# Patient Record
Sex: Male | Born: 1935 | Race: White | Hispanic: No | Marital: Married | Smoking: Former smoker
Health system: Southern US, Community
[De-identification: ages and names within clinical notes are randomized; demographics above are authoritative.]

## PROBLEM LIST (undated history)

## (undated) DIAGNOSIS — E785 Hyperlipidemia, unspecified: Secondary | ICD-10-CM

## (undated) DIAGNOSIS — K219 Gastro-esophageal reflux disease without esophagitis: Secondary | ICD-10-CM

## (undated) DIAGNOSIS — R609 Edema, unspecified: Secondary | ICD-10-CM

## (undated) DIAGNOSIS — N289 Disorder of kidney and ureter, unspecified: Secondary | ICD-10-CM

## (undated) DIAGNOSIS — I2510 Atherosclerotic heart disease of native coronary artery without angina pectoris: Secondary | ICD-10-CM

## (undated) DIAGNOSIS — G629 Polyneuropathy, unspecified: Secondary | ICD-10-CM

## (undated) DIAGNOSIS — E119 Type 2 diabetes mellitus without complications: Secondary | ICD-10-CM

## (undated) DIAGNOSIS — F419 Anxiety disorder, unspecified: Secondary | ICD-10-CM

## (undated) DIAGNOSIS — R740 Nonspecific elevation of levels of transaminase and lactic acid dehydrogenase [LDH]: Secondary | ICD-10-CM

## (undated) DIAGNOSIS — G2581 Restless legs syndrome: Secondary | ICD-10-CM

## (undated) DIAGNOSIS — F329 Major depressive disorder, single episode, unspecified: Secondary | ICD-10-CM

## (undated) DIAGNOSIS — K5790 Diverticulosis of intestine, part unspecified, without perforation or abscess without bleeding: Secondary | ICD-10-CM

## (undated) DIAGNOSIS — D696 Thrombocytopenia, unspecified: Secondary | ICD-10-CM

## (undated) DIAGNOSIS — N400 Enlarged prostate without lower urinary tract symptoms: Secondary | ICD-10-CM

## (undated) DIAGNOSIS — R51 Headache: Secondary | ICD-10-CM

## (undated) DIAGNOSIS — K7460 Unspecified cirrhosis of liver: Secondary | ICD-10-CM

## (undated) DIAGNOSIS — F32A Depression, unspecified: Secondary | ICD-10-CM

## (undated) DIAGNOSIS — R42 Dizziness and giddiness: Secondary | ICD-10-CM

## (undated) DIAGNOSIS — R079 Chest pain, unspecified: Secondary | ICD-10-CM

## (undated) DIAGNOSIS — K625 Hemorrhage of anus and rectum: Secondary | ICD-10-CM

## (undated) DIAGNOSIS — D126 Benign neoplasm of colon, unspecified: Secondary | ICD-10-CM

## (undated) DIAGNOSIS — I10 Essential (primary) hypertension: Secondary | ICD-10-CM

## (undated) DIAGNOSIS — R55 Syncope and collapse: Secondary | ICD-10-CM

## (undated) DIAGNOSIS — N189 Chronic kidney disease, unspecified: Secondary | ICD-10-CM

## (undated) DIAGNOSIS — R600 Localized edema: Secondary | ICD-10-CM

## (undated) DIAGNOSIS — D509 Iron deficiency anemia, unspecified: Secondary | ICD-10-CM

## (undated) DIAGNOSIS — K120 Recurrent oral aphthae: Secondary | ICD-10-CM

## (undated) DIAGNOSIS — K432 Incisional hernia without obstruction or gangrene: Secondary | ICD-10-CM

## (undated) DIAGNOSIS — R7401 Elevation of levels of liver transaminase levels: Secondary | ICD-10-CM

## (undated) DIAGNOSIS — K766 Portal hypertension: Secondary | ICD-10-CM

## (undated) DIAGNOSIS — K589 Irritable bowel syndrome without diarrhea: Secondary | ICD-10-CM

## (undated) DIAGNOSIS — C189 Malignant neoplasm of colon, unspecified: Secondary | ICD-10-CM

## (undated) HISTORY — DX: Depression, unspecified: F32.A

## (undated) HISTORY — PX: APPENDECTOMY: SHX54

## (undated) HISTORY — DX: Dizziness and giddiness: R42

## (undated) HISTORY — DX: Nonspecific elevation of levels of transaminase and lactic acid dehydrogenase (ldh): R74.0

## (undated) HISTORY — DX: Chronic kidney disease, unspecified: N18.9

## (undated) HISTORY — DX: Polyneuropathy, unspecified: G62.9

## (undated) HISTORY — DX: Malignant neoplasm of colon, unspecified: C18.9

## (undated) HISTORY — DX: Chest pain, unspecified: R07.9

## (undated) HISTORY — DX: Anxiety disorder, unspecified: F41.9

## (undated) HISTORY — PX: HERNIA REPAIR: SHX51

## (undated) HISTORY — DX: Major depressive disorder, single episode, unspecified: F32.9

## (undated) HISTORY — DX: Benign prostatic hyperplasia without lower urinary tract symptoms: N40.0

## (undated) HISTORY — DX: Elevation of levels of liver transaminase levels: R74.01

## (undated) HISTORY — PX: PTCA: SHX146

## (undated) HISTORY — DX: Localized edema: R60.0

## (undated) HISTORY — DX: Syncope and collapse: R55

## (undated) HISTORY — DX: Edema, unspecified: R60.9

## (undated) HISTORY — DX: Type 2 diabetes mellitus without complications: E11.9

## (undated) HISTORY — DX: Portal hypertension: K76.6

## (undated) HISTORY — DX: Unspecified cirrhosis of liver: K74.60

## (undated) HISTORY — DX: Recurrent oral aphthae: K12.0

## (undated) HISTORY — DX: Restless legs syndrome: G25.81

## (undated) HISTORY — PX: HAND SURGERY: SHX662

## (undated) HISTORY — DX: Hyperlipidemia, unspecified: E78.5

## (undated) HISTORY — DX: Incisional hernia without obstruction or gangrene: K43.2

## (undated) HISTORY — DX: Disorder of kidney and ureter, unspecified: N28.9

## (undated) HISTORY — DX: Gastro-esophageal reflux disease without esophagitis: K21.9

## (undated) HISTORY — PX: TONSILLECTOMY: SUR1361

## (undated) HISTORY — DX: Atherosclerotic heart disease of native coronary artery without angina pectoris: I25.10

## (undated) HISTORY — PX: INTRAOCULAR LENS INSERTION: SHX110

## (undated) HISTORY — DX: Thrombocytopenia, unspecified: D69.6

## (undated) HISTORY — DX: Headache: R51

## (undated) HISTORY — DX: Iron deficiency anemia, unspecified: D50.9

## (undated) HISTORY — DX: Essential (primary) hypertension: I10

## (undated) HISTORY — DX: Benign neoplasm of colon, unspecified: D12.6

## (undated) HISTORY — PX: NECK SURGERY: SHX720

## (undated) HISTORY — DX: Hemorrhage of anus and rectum: K62.5

## (undated) HISTORY — DX: Irritable bowel syndrome, unspecified: K58.9

## (undated) HISTORY — DX: Diverticulosis of intestine, part unspecified, without perforation or abscess without bleeding: K57.90

---

## 1983-09-06 DIAGNOSIS — C189 Malignant neoplasm of colon, unspecified: Secondary | ICD-10-CM

## 1983-09-06 HISTORY — DX: Malignant neoplasm of colon, unspecified: C18.9

## 1993-10-06 DIAGNOSIS — D126 Benign neoplasm of colon, unspecified: Secondary | ICD-10-CM

## 1993-10-06 HISTORY — DX: Benign neoplasm of colon, unspecified: D12.6

## 1998-04-01 ENCOUNTER — Ambulatory Visit (HOSPITAL_COMMUNITY): Admission: RE | Admit: 1998-04-01 | Discharge: 1998-04-01 | Payer: Self-pay | Admitting: Internal Medicine

## 1998-04-06 ENCOUNTER — Ambulatory Visit (HOSPITAL_COMMUNITY): Admission: RE | Admit: 1998-04-06 | Discharge: 1998-04-06 | Payer: Self-pay | Admitting: Gastroenterology

## 1998-04-14 ENCOUNTER — Ambulatory Visit (HOSPITAL_COMMUNITY): Admission: RE | Admit: 1998-04-14 | Discharge: 1998-04-14 | Payer: Self-pay | Admitting: Gastroenterology

## 1998-06-23 ENCOUNTER — Ambulatory Visit (HOSPITAL_COMMUNITY): Admission: RE | Admit: 1998-06-23 | Discharge: 1998-06-23 | Payer: Self-pay | Admitting: Internal Medicine

## 1998-06-23 ENCOUNTER — Encounter: Payer: Self-pay | Admitting: Internal Medicine

## 1999-03-06 ENCOUNTER — Encounter: Payer: Self-pay | Admitting: Emergency Medicine

## 1999-03-06 ENCOUNTER — Emergency Department (HOSPITAL_COMMUNITY): Admission: EM | Admit: 1999-03-06 | Discharge: 1999-03-06 | Payer: Self-pay | Admitting: Emergency Medicine

## 1999-10-13 ENCOUNTER — Encounter: Payer: Self-pay | Admitting: Emergency Medicine

## 1999-10-13 ENCOUNTER — Inpatient Hospital Stay (HOSPITAL_COMMUNITY): Admission: EM | Admit: 1999-10-13 | Discharge: 1999-10-19 | Payer: Self-pay | Admitting: Emergency Medicine

## 1999-10-15 ENCOUNTER — Encounter: Payer: Self-pay | Admitting: Emergency Medicine

## 1999-10-15 ENCOUNTER — Encounter: Payer: Self-pay | Admitting: Cardiology

## 1999-10-27 ENCOUNTER — Ambulatory Visit (HOSPITAL_COMMUNITY): Admission: RE | Admit: 1999-10-27 | Discharge: 1999-10-27 | Payer: Self-pay | Admitting: Cardiology

## 1999-10-27 ENCOUNTER — Encounter: Payer: Self-pay | Admitting: Cardiology

## 2001-08-23 ENCOUNTER — Ambulatory Visit (HOSPITAL_COMMUNITY): Admission: RE | Admit: 2001-08-23 | Discharge: 2001-08-23 | Payer: Self-pay | Admitting: Internal Medicine

## 2001-08-23 ENCOUNTER — Encounter: Payer: Self-pay | Admitting: Internal Medicine

## 2001-09-07 ENCOUNTER — Encounter: Payer: Self-pay | Admitting: Orthopedic Surgery

## 2001-09-07 ENCOUNTER — Ambulatory Visit (HOSPITAL_COMMUNITY): Admission: RE | Admit: 2001-09-07 | Discharge: 2001-09-07 | Payer: Self-pay | Admitting: Orthopedic Surgery

## 2002-10-11 ENCOUNTER — Encounter: Payer: Self-pay | Admitting: Emergency Medicine

## 2002-10-11 ENCOUNTER — Emergency Department (HOSPITAL_COMMUNITY): Admission: EM | Admit: 2002-10-11 | Discharge: 2002-10-11 | Payer: Self-pay | Admitting: Emergency Medicine

## 2003-11-06 ENCOUNTER — Ambulatory Visit (HOSPITAL_COMMUNITY): Admission: RE | Admit: 2003-11-06 | Discharge: 2003-11-06 | Payer: Self-pay | Admitting: Cardiology

## 2004-07-23 ENCOUNTER — Ambulatory Visit: Payer: Self-pay | Admitting: Cardiology

## 2004-09-13 ENCOUNTER — Ambulatory Visit: Payer: Self-pay | Admitting: Internal Medicine

## 2004-09-20 ENCOUNTER — Ambulatory Visit: Payer: Self-pay | Admitting: Cardiology

## 2004-09-29 ENCOUNTER — Emergency Department (HOSPITAL_COMMUNITY): Admission: EM | Admit: 2004-09-29 | Discharge: 2004-09-29 | Payer: Self-pay | Admitting: Emergency Medicine

## 2004-10-06 ENCOUNTER — Ambulatory Visit: Payer: Self-pay | Admitting: Internal Medicine

## 2004-10-12 ENCOUNTER — Ambulatory Visit: Payer: Self-pay | Admitting: Internal Medicine

## 2004-10-14 ENCOUNTER — Ambulatory Visit (HOSPITAL_COMMUNITY): Admission: RE | Admit: 2004-10-14 | Discharge: 2004-10-14 | Payer: Self-pay | Admitting: Internal Medicine

## 2004-10-20 ENCOUNTER — Ambulatory Visit: Payer: Self-pay

## 2004-11-11 ENCOUNTER — Ambulatory Visit: Payer: Self-pay | Admitting: Gastroenterology

## 2004-11-26 ENCOUNTER — Ambulatory Visit: Payer: Self-pay | Admitting: Gastroenterology

## 2005-01-10 ENCOUNTER — Ambulatory Visit: Payer: Self-pay | Admitting: Internal Medicine

## 2005-01-13 ENCOUNTER — Inpatient Hospital Stay (HOSPITAL_COMMUNITY): Admission: EM | Admit: 2005-01-13 | Discharge: 2005-01-15 | Payer: Self-pay | Admitting: Emergency Medicine

## 2005-01-13 ENCOUNTER — Ambulatory Visit: Payer: Self-pay | Admitting: Internal Medicine

## 2005-01-14 ENCOUNTER — Ambulatory Visit: Payer: Self-pay | Admitting: Internal Medicine

## 2005-02-03 ENCOUNTER — Ambulatory Visit: Payer: Self-pay | Admitting: Internal Medicine

## 2005-02-09 ENCOUNTER — Ambulatory Visit: Payer: Self-pay | Admitting: Internal Medicine

## 2005-02-17 ENCOUNTER — Ambulatory Visit: Payer: Self-pay | Admitting: Internal Medicine

## 2005-02-25 ENCOUNTER — Ambulatory Visit: Payer: Self-pay | Admitting: Internal Medicine

## 2005-03-14 ENCOUNTER — Ambulatory Visit: Payer: Self-pay | Admitting: Cardiology

## 2005-05-26 ENCOUNTER — Ambulatory Visit: Payer: Self-pay | Admitting: Internal Medicine

## 2005-12-12 ENCOUNTER — Ambulatory Visit: Payer: Self-pay | Admitting: Internal Medicine

## 2005-12-29 ENCOUNTER — Ambulatory Visit: Payer: Self-pay | Admitting: Cardiology

## 2006-01-03 ENCOUNTER — Ambulatory Visit: Payer: Self-pay | Admitting: Cardiology

## 2006-01-04 ENCOUNTER — Ambulatory Visit: Payer: Self-pay | Admitting: Cardiology

## 2006-01-04 ENCOUNTER — Ambulatory Visit (HOSPITAL_COMMUNITY): Admission: RE | Admit: 2006-01-04 | Discharge: 2006-01-04 | Payer: Self-pay | Admitting: Cardiology

## 2006-01-28 ENCOUNTER — Emergency Department (HOSPITAL_COMMUNITY): Admission: EM | Admit: 2006-01-28 | Discharge: 2006-01-28 | Payer: Self-pay | Admitting: Emergency Medicine

## 2006-02-01 ENCOUNTER — Ambulatory Visit: Payer: Self-pay | Admitting: Cardiology

## 2006-02-04 ENCOUNTER — Emergency Department (HOSPITAL_COMMUNITY): Admission: EM | Admit: 2006-02-04 | Discharge: 2006-02-04 | Payer: Self-pay | Admitting: Emergency Medicine

## 2006-03-13 ENCOUNTER — Ambulatory Visit: Payer: Self-pay | Admitting: Internal Medicine

## 2006-03-15 ENCOUNTER — Ambulatory Visit: Payer: Self-pay | Admitting: Cardiology

## 2006-04-27 ENCOUNTER — Emergency Department (HOSPITAL_COMMUNITY): Admission: EM | Admit: 2006-04-27 | Discharge: 2006-04-28 | Payer: Self-pay | Admitting: Emergency Medicine

## 2006-04-28 ENCOUNTER — Emergency Department (HOSPITAL_COMMUNITY): Admission: EM | Admit: 2006-04-28 | Discharge: 2006-04-29 | Payer: Self-pay | Admitting: *Deleted

## 2006-04-28 ENCOUNTER — Ambulatory Visit: Payer: Self-pay | Admitting: Endocrinology

## 2006-05-08 ENCOUNTER — Emergency Department (HOSPITAL_COMMUNITY): Admission: EM | Admit: 2006-05-08 | Discharge: 2006-05-08 | Payer: Self-pay | Admitting: *Deleted

## 2006-06-16 ENCOUNTER — Ambulatory Visit: Payer: Self-pay | Admitting: Cardiology

## 2006-07-24 ENCOUNTER — Ambulatory Visit: Payer: Self-pay | Admitting: Internal Medicine

## 2006-12-15 ENCOUNTER — Ambulatory Visit: Payer: Self-pay | Admitting: Cardiology

## 2006-12-22 ENCOUNTER — Ambulatory Visit: Payer: Self-pay | Admitting: Internal Medicine

## 2006-12-27 ENCOUNTER — Ambulatory Visit: Payer: Self-pay | Admitting: Internal Medicine

## 2006-12-30 ENCOUNTER — Encounter: Admission: RE | Admit: 2006-12-30 | Discharge: 2006-12-30 | Payer: Self-pay | Admitting: Internal Medicine

## 2007-03-22 ENCOUNTER — Encounter: Payer: Self-pay | Admitting: Internal Medicine

## 2007-03-22 DIAGNOSIS — I2510 Atherosclerotic heart disease of native coronary artery without angina pectoris: Secondary | ICD-10-CM | POA: Insufficient documentation

## 2007-03-22 DIAGNOSIS — Z8619 Personal history of other infectious and parasitic diseases: Secondary | ICD-10-CM

## 2007-03-22 DIAGNOSIS — Z860100 Personal history of colon polyps, unspecified: Secondary | ICD-10-CM | POA: Insufficient documentation

## 2007-03-22 DIAGNOSIS — K589 Irritable bowel syndrome without diarrhea: Secondary | ICD-10-CM

## 2007-03-22 DIAGNOSIS — Z8719 Personal history of other diseases of the digestive system: Secondary | ICD-10-CM

## 2007-03-22 DIAGNOSIS — K219 Gastro-esophageal reflux disease without esophagitis: Secondary | ICD-10-CM | POA: Insufficient documentation

## 2007-04-06 ENCOUNTER — Ambulatory Visit: Payer: Self-pay | Admitting: Internal Medicine

## 2007-04-08 DIAGNOSIS — N400 Enlarged prostate without lower urinary tract symptoms: Secondary | ICD-10-CM

## 2007-06-11 ENCOUNTER — Ambulatory Visit: Payer: Self-pay | Admitting: Cardiology

## 2007-06-12 ENCOUNTER — Ambulatory Visit: Payer: Self-pay | Admitting: Internal Medicine

## 2007-06-12 ENCOUNTER — Encounter: Payer: Self-pay | Admitting: Internal Medicine

## 2007-06-12 DIAGNOSIS — E785 Hyperlipidemia, unspecified: Secondary | ICD-10-CM

## 2007-06-12 DIAGNOSIS — F411 Generalized anxiety disorder: Secondary | ICD-10-CM | POA: Insufficient documentation

## 2007-06-12 DIAGNOSIS — N390 Urinary tract infection, site not specified: Secondary | ICD-10-CM

## 2007-06-12 DIAGNOSIS — I10 Essential (primary) hypertension: Secondary | ICD-10-CM | POA: Insufficient documentation

## 2007-06-12 DIAGNOSIS — F329 Major depressive disorder, single episode, unspecified: Secondary | ICD-10-CM

## 2007-06-12 DIAGNOSIS — Z8639 Personal history of other endocrine, nutritional and metabolic disease: Secondary | ICD-10-CM

## 2007-06-12 DIAGNOSIS — Z862 Personal history of diseases of the blood and blood-forming organs and certain disorders involving the immune mechanism: Secondary | ICD-10-CM

## 2007-06-12 DIAGNOSIS — F3289 Other specified depressive episodes: Secondary | ICD-10-CM | POA: Insufficient documentation

## 2007-06-13 ENCOUNTER — Ambulatory Visit: Payer: Self-pay | Admitting: Cardiology

## 2007-06-13 LAB — CONVERTED CEMR LAB
ALT: 13 units/L (ref 0–53)
AST: 19 units/L (ref 0–37)
Alkaline Phosphatase: 55 units/L (ref 39–117)
Cholesterol: 123 mg/dL (ref 0–200)
HDL: 29.3 mg/dL — ABNORMAL LOW (ref >39.0)
LDL Cholesterol: 80 mg/dL (ref 0–99)
Total Protein: 6.8 g/dL (ref 6.0–8.3)

## 2007-07-10 ENCOUNTER — Encounter: Payer: Self-pay | Admitting: Internal Medicine

## 2007-10-24 ENCOUNTER — Ambulatory Visit: Payer: Self-pay | Admitting: Gastroenterology

## 2007-11-06 ENCOUNTER — Emergency Department (HOSPITAL_COMMUNITY): Admission: EM | Admit: 2007-11-06 | Discharge: 2007-11-06 | Payer: Self-pay | Admitting: Emergency Medicine

## 2007-11-06 ENCOUNTER — Encounter: Payer: Self-pay | Admitting: Internal Medicine

## 2007-11-07 ENCOUNTER — Ambulatory Visit: Payer: Self-pay | Admitting: Internal Medicine

## 2007-11-07 DIAGNOSIS — R945 Abnormal results of liver function studies: Secondary | ICD-10-CM | POA: Insufficient documentation

## 2007-11-07 DIAGNOSIS — R319 Hematuria, unspecified: Secondary | ICD-10-CM

## 2007-11-07 LAB — CONVERTED CEMR LAB
ALT: 153 units/L — ABNORMAL HIGH (ref 0–53)
Bilirubin, Direct: 0.4 mg/dL — ABNORMAL HIGH (ref 0.0–0.3)
Total Protein: 6.4 g/dL (ref 6.0–8.3)

## 2007-11-08 LAB — CONVERTED CEMR LAB
Hep A IgM: NEGATIVE
Hepatitis B Surface Ag: NEGATIVE

## 2007-11-09 ENCOUNTER — Ambulatory Visit: Payer: Self-pay | Admitting: Cardiology

## 2007-11-09 ENCOUNTER — Telehealth: Payer: Self-pay | Admitting: Internal Medicine

## 2007-11-14 ENCOUNTER — Ambulatory Visit: Payer: Self-pay | Admitting: Internal Medicine

## 2007-11-14 DIAGNOSIS — K120 Recurrent oral aphthae: Secondary | ICD-10-CM | POA: Insufficient documentation

## 2007-11-14 LAB — CONVERTED CEMR LAB
ALT: 94 units/L — ABNORMAL HIGH (ref 0–53)
AST: 82 units/L — ABNORMAL HIGH (ref 0–37)
Bilirubin, Direct: 0.3 mg/dL (ref 0.0–0.3)
Total Protein: 6.6 g/dL (ref 6.0–8.3)

## 2007-11-21 ENCOUNTER — Ambulatory Visit: Payer: Self-pay | Admitting: Gastroenterology

## 2007-11-21 LAB — CONVERTED CEMR LAB
Albumin: 3.7 g/dL (ref 3.5–5.2)
Angiotensin 1 Converting Enzyme: 49 units/L (ref 9–67)
Anti Nuclear Antibody(ANA): NEGATIVE
INR: 1 (ref 0.8–1.0)
Iron: 132 ug/dL (ref 42–165)
Total Bilirubin: 1.4 mg/dL — ABNORMAL HIGH (ref 0.3–1.2)
Total Protein: 6.6 g/dL (ref 6.0–8.3)
Transferrin: 193.6 mg/dL — ABNORMAL LOW (ref >=212.0)

## 2007-11-23 ENCOUNTER — Encounter: Payer: Self-pay | Admitting: Internal Medicine

## 2007-11-28 ENCOUNTER — Ambulatory Visit (HOSPITAL_COMMUNITY): Admission: RE | Admit: 2007-11-28 | Discharge: 2007-11-28 | Payer: Self-pay | Admitting: Gastroenterology

## 2007-12-12 ENCOUNTER — Ambulatory Visit: Payer: Self-pay | Admitting: Internal Medicine

## 2007-12-12 DIAGNOSIS — K432 Incisional hernia without obstruction or gangrene: Secondary | ICD-10-CM | POA: Insufficient documentation

## 2007-12-24 ENCOUNTER — Ambulatory Visit: Payer: Self-pay | Admitting: Cardiology

## 2007-12-25 ENCOUNTER — Ambulatory Visit: Payer: Self-pay

## 2007-12-25 ENCOUNTER — Encounter: Payer: Self-pay | Admitting: Cardiovascular Disease

## 2007-12-25 ENCOUNTER — Encounter: Payer: Self-pay | Admitting: Internal Medicine

## 2008-01-04 HISTORY — PX: CHOLECYSTECTOMY: SHX55

## 2008-01-07 ENCOUNTER — Ambulatory Visit: Payer: Self-pay | Admitting: Internal Medicine

## 2008-01-07 DIAGNOSIS — H6690 Otitis media, unspecified, unspecified ear: Secondary | ICD-10-CM | POA: Insufficient documentation

## 2008-01-07 DIAGNOSIS — H6120 Impacted cerumen, unspecified ear: Secondary | ICD-10-CM

## 2008-01-09 ENCOUNTER — Encounter (INDEPENDENT_AMBULATORY_CARE_PROVIDER_SITE_OTHER): Payer: Self-pay | Admitting: General Surgery

## 2008-01-09 ENCOUNTER — Ambulatory Visit (HOSPITAL_COMMUNITY): Admission: RE | Admit: 2008-01-09 | Discharge: 2008-01-10 | Payer: Self-pay | Admitting: General Surgery

## 2008-01-12 ENCOUNTER — Emergency Department (HOSPITAL_COMMUNITY): Admission: EM | Admit: 2008-01-12 | Discharge: 2008-01-12 | Payer: Self-pay | Admitting: Emergency Medicine

## 2008-01-15 ENCOUNTER — Encounter: Payer: Self-pay | Admitting: Internal Medicine

## 2008-01-18 ENCOUNTER — Encounter: Payer: Self-pay | Admitting: Internal Medicine

## 2008-01-18 ENCOUNTER — Encounter: Payer: Self-pay | Admitting: Gastroenterology

## 2008-01-18 ENCOUNTER — Telehealth: Payer: Self-pay | Admitting: Gastroenterology

## 2008-01-22 ENCOUNTER — Encounter: Payer: Self-pay | Admitting: Gastroenterology

## 2008-01-22 ENCOUNTER — Ambulatory Visit (HOSPITAL_COMMUNITY): Admission: RE | Admit: 2008-01-22 | Discharge: 2008-01-22 | Payer: Self-pay | Admitting: Gastroenterology

## 2008-01-24 ENCOUNTER — Ambulatory Visit: Payer: Self-pay | Admitting: Gastroenterology

## 2008-02-18 ENCOUNTER — Encounter: Payer: Self-pay | Admitting: Gastroenterology

## 2008-02-19 ENCOUNTER — Ambulatory Visit: Payer: Self-pay | Admitting: Gastroenterology

## 2008-02-19 DIAGNOSIS — R7401 Elevation of levels of liver transaminase levels: Secondary | ICD-10-CM | POA: Insufficient documentation

## 2008-02-19 DIAGNOSIS — Z85038 Personal history of other malignant neoplasm of large intestine: Secondary | ICD-10-CM | POA: Insufficient documentation

## 2008-02-19 DIAGNOSIS — R740 Nonspecific elevation of levels of transaminase and lactic acid dehydrogenase [LDH]: Secondary | ICD-10-CM

## 2008-02-19 DIAGNOSIS — R1031 Right lower quadrant pain: Secondary | ICD-10-CM

## 2008-02-19 LAB — CONVERTED CEMR LAB
Alkaline Phosphatase: 69 units/L (ref 39–117)
Bilirubin, Direct: 0.2 mg/dL (ref 0.0–0.3)

## 2008-02-27 ENCOUNTER — Ambulatory Visit: Payer: Self-pay | Admitting: Internal Medicine

## 2008-02-27 DIAGNOSIS — R51 Headache: Secondary | ICD-10-CM

## 2008-02-27 DIAGNOSIS — R55 Syncope and collapse: Secondary | ICD-10-CM

## 2008-02-27 DIAGNOSIS — R519 Headache, unspecified: Secondary | ICD-10-CM | POA: Insufficient documentation

## 2008-03-27 ENCOUNTER — Ambulatory Visit: Payer: Self-pay | Admitting: Cardiology

## 2008-03-27 LAB — CONVERTED CEMR LAB
HDL: 32.9 mg/dL — ABNORMAL LOW (ref >39.0)
LDL Cholesterol: 81 mg/dL (ref 0–99)
Total Bilirubin: 2.1 mg/dL — ABNORMAL HIGH (ref 0.3–1.2)
Total CHOL/HDL Ratio: 4.1
VLDL: 21 mg/dL (ref 0–40)

## 2008-04-02 ENCOUNTER — Ambulatory Visit: Payer: Self-pay

## 2008-04-11 ENCOUNTER — Encounter: Payer: Self-pay | Admitting: Internal Medicine

## 2008-04-22 ENCOUNTER — Ambulatory Visit: Payer: Self-pay | Admitting: Internal Medicine

## 2008-04-22 DIAGNOSIS — IMO0002 Reserved for concepts with insufficient information to code with codable children: Secondary | ICD-10-CM | POA: Insufficient documentation

## 2008-06-03 ENCOUNTER — Encounter: Payer: Self-pay | Admitting: Internal Medicine

## 2008-06-11 ENCOUNTER — Ambulatory Visit: Payer: Self-pay | Admitting: Internal Medicine

## 2008-06-11 DIAGNOSIS — H9190 Unspecified hearing loss, unspecified ear: Secondary | ICD-10-CM | POA: Insufficient documentation

## 2008-06-11 DIAGNOSIS — H60399 Other infective otitis externa, unspecified ear: Secondary | ICD-10-CM | POA: Insufficient documentation

## 2008-06-17 ENCOUNTER — Encounter: Payer: Self-pay | Admitting: Internal Medicine

## 2008-06-18 ENCOUNTER — Encounter: Admission: RE | Admit: 2008-06-18 | Discharge: 2008-06-18 | Payer: Self-pay | Admitting: Orthopedic Surgery

## 2008-06-19 ENCOUNTER — Encounter: Payer: Self-pay | Admitting: Internal Medicine

## 2008-09-30 ENCOUNTER — Ambulatory Visit: Payer: Self-pay | Admitting: Cardiology

## 2008-10-13 ENCOUNTER — Ambulatory Visit: Payer: Self-pay

## 2008-10-13 ENCOUNTER — Encounter: Payer: Self-pay | Admitting: Cardiology

## 2008-10-17 ENCOUNTER — Encounter: Payer: Self-pay | Admitting: Internal Medicine

## 2008-11-14 ENCOUNTER — Encounter: Payer: Self-pay | Admitting: Internal Medicine

## 2008-11-24 ENCOUNTER — Ambulatory Visit: Payer: Self-pay | Admitting: Cardiology

## 2009-01-02 ENCOUNTER — Encounter: Payer: Self-pay | Admitting: Internal Medicine

## 2009-01-28 ENCOUNTER — Ambulatory Visit: Payer: Self-pay | Admitting: Internal Medicine

## 2009-01-28 DIAGNOSIS — M25519 Pain in unspecified shoulder: Secondary | ICD-10-CM | POA: Insufficient documentation

## 2009-01-28 DIAGNOSIS — J209 Acute bronchitis, unspecified: Secondary | ICD-10-CM

## 2009-02-10 ENCOUNTER — Ambulatory Visit: Payer: Self-pay | Admitting: Internal Medicine

## 2009-02-10 ENCOUNTER — Inpatient Hospital Stay (HOSPITAL_COMMUNITY): Admission: EM | Admit: 2009-02-10 | Discharge: 2009-02-15 | Payer: Self-pay | Admitting: Emergency Medicine

## 2009-02-11 ENCOUNTER — Ambulatory Visit: Payer: Self-pay | Admitting: Vascular Surgery

## 2009-02-11 ENCOUNTER — Encounter (INDEPENDENT_AMBULATORY_CARE_PROVIDER_SITE_OTHER): Payer: Self-pay | Admitting: Internal Medicine

## 2009-02-11 ENCOUNTER — Encounter (INDEPENDENT_AMBULATORY_CARE_PROVIDER_SITE_OTHER): Payer: Self-pay | Admitting: *Deleted

## 2009-02-11 ENCOUNTER — Encounter: Payer: Self-pay | Admitting: Internal Medicine

## 2009-02-12 ENCOUNTER — Encounter (INDEPENDENT_AMBULATORY_CARE_PROVIDER_SITE_OTHER): Payer: Self-pay | Admitting: *Deleted

## 2009-02-16 ENCOUNTER — Inpatient Hospital Stay (HOSPITAL_COMMUNITY): Admission: EM | Admit: 2009-02-16 | Discharge: 2009-02-20 | Payer: Self-pay | Admitting: Emergency Medicine

## 2009-02-16 ENCOUNTER — Ambulatory Visit: Payer: Self-pay | Admitting: Internal Medicine

## 2009-02-18 ENCOUNTER — Ambulatory Visit: Payer: Self-pay | Admitting: Internal Medicine

## 2009-02-27 ENCOUNTER — Ambulatory Visit: Payer: Self-pay | Admitting: Internal Medicine

## 2009-02-27 DIAGNOSIS — R609 Edema, unspecified: Secondary | ICD-10-CM

## 2009-02-27 DIAGNOSIS — R1084 Generalized abdominal pain: Secondary | ICD-10-CM | POA: Insufficient documentation

## 2009-02-27 LAB — CONVERTED CEMR LAB
Basophils Absolute: 0 10*3/uL (ref 0.0–0.1)
CO2: 33 meq/L — ABNORMAL HIGH (ref 19–32)
GFR calc non Af Amer: 77.07 mL/min (ref >60)
Glucose, Bld: 105 mg/dL — ABNORMAL HIGH (ref 70–99)
HCT: 35.1 % — ABNORMAL LOW (ref 39.0–52.0)
Hemoglobin: 12.1 g/dL — ABNORMAL LOW (ref 13.0–17.0)
Ketones, ur: NEGATIVE mg/dL
Lymphs Abs: 1.4 10*3/uL (ref 0.7–4.0)
MCHC: 34.6 g/dL (ref 30.0–36.0)
Monocytes Relative: 8.4 % (ref 3.0–12.0)
Neutro Abs: 2.3 10*3/uL (ref 1.4–7.7)
Potassium: 3.7 meq/L (ref 3.5–5.1)
RDW: 13.3 % (ref 11.5–14.6)
Sodium: 142 meq/L (ref 135–145)
Specific Gravity, Urine: 1.015 (ref 1.000–1.030)
Urine Glucose: NEGATIVE mg/dL
Urobilinogen, UA: 1 (ref 0.0–1.0)
pH: 7 (ref 5.0–8.0)

## 2009-03-02 ENCOUNTER — Encounter: Payer: Self-pay | Admitting: Internal Medicine

## 2009-03-04 ENCOUNTER — Ambulatory Visit: Payer: Self-pay | Admitting: Internal Medicine

## 2009-03-07 ENCOUNTER — Emergency Department (HOSPITAL_COMMUNITY): Admission: EM | Admit: 2009-03-07 | Discharge: 2009-03-07 | Payer: Self-pay | Admitting: Emergency Medicine

## 2009-03-07 ENCOUNTER — Encounter: Payer: Self-pay | Admitting: Endocrinology

## 2009-03-07 ENCOUNTER — Telehealth (INDEPENDENT_AMBULATORY_CARE_PROVIDER_SITE_OTHER): Payer: Self-pay | Admitting: *Deleted

## 2009-03-13 ENCOUNTER — Ambulatory Visit: Payer: Self-pay | Admitting: Internal Medicine

## 2009-03-13 DIAGNOSIS — I951 Orthostatic hypotension: Secondary | ICD-10-CM | POA: Insufficient documentation

## 2009-04-08 ENCOUNTER — Ambulatory Visit: Payer: Self-pay

## 2009-04-08 ENCOUNTER — Telehealth: Payer: Self-pay | Admitting: Cardiology

## 2009-04-08 ENCOUNTER — Encounter: Payer: Self-pay | Admitting: Cardiology

## 2009-04-09 ENCOUNTER — Telehealth: Payer: Self-pay | Admitting: Cardiology

## 2009-04-10 ENCOUNTER — Ambulatory Visit: Payer: Self-pay | Admitting: Internal Medicine

## 2009-04-10 LAB — CONVERTED CEMR LAB
Albumin: 3.7 g/dL (ref 3.5–5.2)
Basophils Relative: 0.6 % (ref 0.0–3.0)
CO2: 34 meq/L — ABNORMAL HIGH (ref 19–32)
Chloride: 106 meq/L (ref 96–112)
Eosinophils Absolute: 0.4 10*3/uL (ref 0.0–0.7)
Eosinophils Relative: 6.8 % — ABNORMAL HIGH (ref 0.0–5.0)
HDL: 33 mg/dL — ABNORMAL LOW (ref >39.00)
Hemoglobin: 14.1 g/dL (ref 13.0–17.0)
LDL Cholesterol: 76 mg/dL (ref 0–99)
MCHC: 34 g/dL (ref 30.0–36.0)
MCV: 92.8 fL (ref 78.0–100.0)
Monocytes Absolute: 0.4 10*3/uL (ref 0.1–1.0)
Neutro Abs: 2.9 10*3/uL (ref 1.4–7.7)
Potassium: 4.9 meq/L (ref 3.5–5.1)
RBC: 4.46 M/uL (ref 4.22–5.81)
Sodium: 142 meq/L (ref 135–145)
Total CHOL/HDL Ratio: 4
Triglycerides: 138 mg/dL (ref 0.0–149.0)
VLDL: 27.6 mg/dL (ref 0.0–40.0)

## 2009-05-09 DIAGNOSIS — Z87898 Personal history of other specified conditions: Secondary | ICD-10-CM

## 2009-05-09 DIAGNOSIS — D509 Iron deficiency anemia, unspecified: Secondary | ICD-10-CM

## 2009-05-09 DIAGNOSIS — N189 Chronic kidney disease, unspecified: Secondary | ICD-10-CM | POA: Insufficient documentation

## 2009-05-09 DIAGNOSIS — E119 Type 2 diabetes mellitus without complications: Secondary | ICD-10-CM

## 2009-05-09 DIAGNOSIS — D696 Thrombocytopenia, unspecified: Secondary | ICD-10-CM

## 2009-05-19 ENCOUNTER — Ambulatory Visit: Payer: Self-pay | Admitting: Cardiology

## 2009-05-21 ENCOUNTER — Encounter: Payer: Self-pay | Admitting: Cardiology

## 2009-05-21 ENCOUNTER — Ambulatory Visit (HOSPITAL_COMMUNITY): Admission: RE | Admit: 2009-05-21 | Discharge: 2009-05-21 | Payer: Self-pay | Admitting: Cardiology

## 2009-07-07 ENCOUNTER — Ambulatory Visit: Payer: Self-pay | Admitting: Internal Medicine

## 2009-07-22 ENCOUNTER — Ambulatory Visit: Payer: Self-pay | Admitting: Cardiology

## 2009-07-22 DIAGNOSIS — N62 Hypertrophy of breast: Secondary | ICD-10-CM

## 2009-07-29 ENCOUNTER — Encounter: Admission: RE | Admit: 2009-07-29 | Discharge: 2009-07-29 | Payer: Self-pay | Admitting: Cardiology

## 2009-08-04 ENCOUNTER — Ambulatory Visit: Payer: Self-pay | Admitting: Internal Medicine

## 2009-08-19 ENCOUNTER — Telehealth: Payer: Self-pay | Admitting: Cardiology

## 2009-08-24 ENCOUNTER — Telehealth: Payer: Self-pay | Admitting: Cardiology

## 2009-09-02 ENCOUNTER — Encounter: Payer: Self-pay | Admitting: Cardiology

## 2009-09-02 ENCOUNTER — Ambulatory Visit: Payer: Self-pay

## 2009-10-13 ENCOUNTER — Encounter (INDEPENDENT_AMBULATORY_CARE_PROVIDER_SITE_OTHER): Payer: Self-pay | Admitting: *Deleted

## 2009-11-12 ENCOUNTER — Encounter: Payer: Self-pay | Admitting: Internal Medicine

## 2009-11-30 ENCOUNTER — Ambulatory Visit: Payer: Self-pay | Admitting: Internal Medicine

## 2009-11-30 ENCOUNTER — Ambulatory Visit: Payer: Self-pay | Admitting: Cardiovascular Disease

## 2009-11-30 DIAGNOSIS — R109 Unspecified abdominal pain: Secondary | ICD-10-CM

## 2009-11-30 DIAGNOSIS — R310 Gross hematuria: Secondary | ICD-10-CM

## 2009-11-30 DIAGNOSIS — M549 Dorsalgia, unspecified: Secondary | ICD-10-CM | POA: Insufficient documentation

## 2009-11-30 DIAGNOSIS — R413 Other amnesia: Secondary | ICD-10-CM

## 2009-11-30 DIAGNOSIS — F29 Unspecified psychosis not due to a substance or known physiological condition: Secondary | ICD-10-CM | POA: Insufficient documentation

## 2009-12-01 LAB — CONVERTED CEMR LAB
Albumin: 3.8 g/dL (ref 3.5–5.2)
Alkaline Phosphatase: 63 units/L (ref 39–117)
Basophils Absolute: 0 10*3/uL (ref 0.0–0.1)
Basophils Relative: 0.8 % (ref 0.0–3.0)
CO2: 30 meq/L (ref 19–32)
Calcium: 9 mg/dL (ref 8.4–10.5)
Chloride: 102 meq/L (ref 96–112)
Creatinine, Ser: 1.4 mg/dL (ref 0.4–1.5)
Eosinophils Absolute: 0.2 10*3/uL (ref 0.0–0.7)
Glucose, Bld: 180 mg/dL — ABNORMAL HIGH (ref 70–99)
Hemoglobin, Urine: NEGATIVE
Hemoglobin: 14.8 g/dL (ref 13.0–17.0)
Hgb A1c MFr Bld: 7.5 % — ABNORMAL HIGH (ref 4.6–6.5)
Leukocytes, UA: NEGATIVE
Lipase: 26 units/L (ref 11.0–59.0)
Lymphocytes Relative: 31.3 % (ref 12.0–46.0)
MCHC: 33.4 g/dL (ref 30.0–36.0)
MCV: 96.6 fL (ref 78.0–100.0)
Monocytes Absolute: 0.3 10*3/uL (ref 0.1–1.0)
Neutro Abs: 3.2 10*3/uL (ref 1.4–7.7)
Nitrite: NEGATIVE
RDW: 13.1 % (ref 11.5–14.6)
Sodium: 139 meq/L (ref 135–145)
Specific Gravity, Urine: >=1.03 (ref 1.000–1.030)
Total Protein: 6.4 g/dL (ref 6.0–8.3)
Urobilinogen, UA: 0.2 (ref 0.0–1.0)

## 2010-01-14 ENCOUNTER — Ambulatory Visit: Payer: Self-pay | Admitting: Cardiology

## 2010-02-24 ENCOUNTER — Ambulatory Visit: Payer: Self-pay | Admitting: Internal Medicine

## 2010-02-24 LAB — CONVERTED CEMR LAB
FSH: 8.3 milliintl units/mL (ref 1.4–18.1)
LH: 3.19 milliintl units/mL (ref 3.10–34.60)
Prolactin: 5.8 ng/mL

## 2010-03-02 ENCOUNTER — Encounter: Admission: RE | Admit: 2010-03-02 | Discharge: 2010-03-02 | Payer: Self-pay | Admitting: Internal Medicine

## 2010-03-09 LAB — CONVERTED CEMR LAB
Sex Hormone Binding: 63 nmol/L (ref 13–71)
Testosterone: 294.53 ng/dL — ABNORMAL LOW (ref 350–890)

## 2010-06-08 ENCOUNTER — Ambulatory Visit: Payer: Self-pay | Admitting: Internal Medicine

## 2010-06-08 DIAGNOSIS — H918X9 Other specified hearing loss, unspecified ear: Secondary | ICD-10-CM | POA: Insufficient documentation

## 2010-06-08 DIAGNOSIS — L989 Disorder of the skin and subcutaneous tissue, unspecified: Secondary | ICD-10-CM | POA: Insufficient documentation

## 2010-06-08 LAB — CONVERTED CEMR LAB
Albumin: 4.4 g/dL (ref 3.5–5.2)
BUN: 20 mg/dL (ref 6–23)
Basophils Absolute: 0 10*3/uL (ref 0.0–0.1)
CO2: 28 meq/L (ref 19–32)
Calcium: 9.7 mg/dL (ref 8.4–10.5)
Cholesterol: 131 mg/dL (ref 0–200)
Creatinine,U: 362.5 mg/dL
Direct LDL: 74.8 mg/dL
Eosinophils Absolute: 0.3 10*3/uL (ref 0.0–0.7)
GFR calc non Af Amer: 38.73 mL/min (ref >60)
Glucose, Bld: 104 mg/dL — ABNORMAL HIGH (ref 70–99)
HCT: 44 % (ref 39.0–52.0)
HDL: 29.3 mg/dL — ABNORMAL LOW (ref >39.00)
Hemoglobin: 15.5 g/dL (ref 13.0–17.0)
Lymphocytes Relative: 26.5 % (ref 12.0–46.0)
Lymphs Abs: 2.3 10*3/uL (ref 0.7–4.0)
MCHC: 35.2 g/dL (ref 30.0–36.0)
Microalb Creat Ratio: 1.6 mg/g (ref 0.0–30.0)
Neutro Abs: 5.4 10*3/uL (ref 1.4–7.7)
Nitrite: NEGATIVE
Platelets: 165 10*3/uL (ref 150.0–400.0)
RDW: 13.9 % (ref 11.5–14.6)
Sodium: 140 meq/L (ref 135–145)
Total Bilirubin: 2.3 mg/dL — ABNORMAL HIGH (ref 0.3–1.2)
Total CHOL/HDL Ratio: 4
Urine Glucose: NEGATIVE mg/dL
VLDL: 67.6 mg/dL — ABNORMAL HIGH (ref 0.0–40.0)
pH: 6 (ref 5.0–8.0)

## 2010-06-10 ENCOUNTER — Encounter: Payer: Self-pay | Admitting: Internal Medicine

## 2010-06-11 ENCOUNTER — Encounter: Payer: Self-pay | Admitting: Internal Medicine

## 2010-06-21 ENCOUNTER — Telehealth: Payer: Self-pay | Admitting: Gastroenterology

## 2010-07-20 ENCOUNTER — Encounter: Payer: Self-pay | Admitting: Internal Medicine

## 2010-07-20 ENCOUNTER — Telehealth: Payer: Self-pay | Admitting: Internal Medicine

## 2010-07-23 ENCOUNTER — Encounter: Admission: RE | Admit: 2010-07-23 | Discharge: 2010-07-23 | Payer: Self-pay | Admitting: Surgery

## 2010-07-28 ENCOUNTER — Ambulatory Visit: Payer: Self-pay | Admitting: Cardiology

## 2010-08-06 ENCOUNTER — Encounter: Payer: Self-pay | Admitting: Internal Medicine

## 2010-09-02 ENCOUNTER — Ambulatory Visit: Payer: Self-pay | Admitting: Internal Medicine

## 2010-09-07 LAB — CONVERTED CEMR LAB
ALT: 26 units/L (ref 0–53)
AST: 28 units/L (ref 0–37)
BUN: 16 mg/dL (ref 6–23)
Basophils Relative: 0.5 % (ref 0.0–3.0)
Bilirubin, Direct: 0.4 mg/dL — ABNORMAL HIGH (ref 0.0–0.3)
Blood, UA: NEGATIVE
Eosinophils Relative: 4.5 % (ref 0.0–5.0)
GFR calc non Af Amer: 50.4 mL/min — ABNORMAL LOW (ref >60.00)
HCT: 45 % (ref 39.0–52.0)
LDL Cholesterol: 84 mg/dL (ref 0–99)
Lymphs Abs: 1.8 10*3/uL (ref 0.7–4.0)
Monocytes Relative: 7.6 % (ref 3.0–12.0)
Nitrite: NEGATIVE
PSA: 1.12 ng/mL (ref 0.10–4.00)
Platelets: 140 10*3/uL — ABNORMAL LOW (ref 150.0–400.0)
Potassium: 4.8 meq/L (ref 3.5–5.1)
RBC: 4.74 M/uL (ref 4.22–5.81)
Sodium: 139 meq/L (ref 135–145)
TSH: 0.54 microintl units/mL (ref 0.35–5.50)
Total Bilirubin: 2.5 mg/dL — ABNORMAL HIGH (ref 0.3–1.2)
Urobilinogen, UA: 0.2 (ref 0.0–1.0)
VLDL: 26 mg/dL (ref 0.0–40.0)
WBC: 6.1 10*3/uL (ref 4.5–10.5)

## 2010-09-09 ENCOUNTER — Ambulatory Visit
Admission: RE | Admit: 2010-09-09 | Discharge: 2010-09-09 | Payer: Self-pay | Source: Home / Self Care | Attending: Internal Medicine | Admitting: Internal Medicine

## 2010-09-09 DIAGNOSIS — R5381 Other malaise: Secondary | ICD-10-CM | POA: Insufficient documentation

## 2010-09-09 DIAGNOSIS — R5383 Other fatigue: Secondary | ICD-10-CM
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
## 2010-10-05 NOTE — Assessment & Plan Note (Signed)
Summary: f35m   Visit Type:  Follow-up Referring Provider:  i knapp Primary Provider:  Oliver Barre, M.D.   History of Present Illness: no cardiology complaints today.  Overall doing well.  A couple of months ago was digging.  He heard his heart beat loud, and then it went away.  His wife says he is more confused and has an appointment with Dr. Jonny Ruiz.  Denies chest pain, except once in awhile, he takes deep breaths and they stop.  BP is variable at this point in time..   Current Medications (verified): 1)  Celexa 20 Mg  Tabs (Citalopram Hydrobromide) .Marland Kitchen.. 1 By Mouth Once Daily 2)  Zetia 10 Mg  Tabs (Ezetimibe) .... Take 1 Tablet By Mouth Once A Day 3)  Pravachol 80 Mg  Tabs (Pravastatin Sodium) .... Take 1 Tablet By Mouth Once A Day 4)  Finasteride 5 Mg  Tabs (Finasteride) .... Take 1 Tablet By Mouth Once A Day 5)  Omeprazole 20 Mg Cpdr (Omeprazole) .Marland Kitchen.. 1po Once Daily 6)  Aspirin 81 Mg Tbec (Aspirin) .... Take One Tablet By Mouth Daily 7)  Metformin Hcl 500 Mg Xr24h-Tab (Metformin Hcl) .Marland Kitchen.. 1po  Once Daily  Allergies: 1)  ! Novocain 2)  ! * Uroxatrol 3)  ! * Rapaflo 4)  ! * Avodart  Comments:  Nurse/Medical Assistant: patient reviewed med list from previous ov and stated that all meds are the same [facility] [employer] [facility]  Past History:  Past Medical History: Last updated: 11/30/2009 Current Problems:  CORONARY ARTERY DISEASE (ICD-414.00) POSTURAL HYPOTENSION (ICD-458.0) HYPERTENSION (ICD-401.9) HYPERLIPIDEMIA (ICD-272.4) RENAL INSUFFICIENCY, ACUTE (ICD-585.9) SYNCOPE (ICD-780.2) PERIPHERAL EDEMA (ICD-782.3) GERD (ICD-530.81) DIABETES MELLITUS, TYPE II (ICD-250.00)- ABDOMINAL PAIN, GENERALIZED (ICD-789.07) SHOULDER PAIN, LEFT (ICD-719.41) BRONCHITIS, ACUTE WITH MILD BRONCHOSPASM (ICD-466.0) UNSPECIFIED HEARING LOSS (ICD-389.9) OTITIS EXTERNA, RIGHT (ICD-380.10) LUMBAR RADICULOPATHY, LEFT (ICD-724.4) HEADACHE (ICD-784.0) PERSONAL HX COLON CANCER  (ICD-V10.05) TRANSAMINASES, SERUM, ELEVATED (ICD-790.4) ABDOMINAL PAIN RIGHT LOWER QUADRANT (ICD-789.03) IMPACTED CERUMEN (ICD-380.4) OTITIS MEDIA, ACUTE, RIGHT (ICD-382.9) PREOPERATIVE EXAMINATION (ICD-V72.84) HERNIA, INCISIONAL (ICD-553.21) APHTHOUS ULCERS (ICD-528.2) HEMATURIA UNSPECIFIED (ICD-599.70) LIVER FUNCTION TESTS, ABNORMAL (ICD-794.8) UTI (ICD-599.0) GLUCOSE INTOLERANCE, MINIMAL, HX OF (ICD-V12.2) DEPRESSION (ICD-311) ANXIETY (ICD-300.00) BENIGN PROSTATIC HYPERTROPHY (ICD-600.00) IBS (ICD-564.1) HEPATITIS B, HX OF (ICD-V12.09) GASTROINTESTINAL HEMORRHAGE, HX OF (ICD-V12.79) COLONIC POLYPS, HX OF (ICD-V12.72) IRON DEFICIENCY (ICD-280.9) BENIGN PROSTATIC HYPERTROPHY, HX OF (ICD-V13.8) THROMBOCYTOPENIA (ICD-287.5) HEARING LOSS, BILATERAL (ICD-389.9) glucose intolerance  Past Surgical History: Last updated: 05/09/2009 Appendectomy Tonsillectomy Hernia Repair Cholecystectomy 01/2008 Bilateral Hand surgery Bilateral eye lens implants  Family History: Last updated: 05/09/2009 No FH of Colon Cancer Family History of Heart Disease: Mother, Brothers x 2  Social History: Last updated: 05/09/2009 Married Former Smoker Occupation: Retired Alcohol Use - yes-1 beer every 2 weeks Daily Caffeine Use-2 cups daily Illicit Drug Use - no Patient gets regular exercise.  Vital Signs:  Patient profile:   77 year old male Weight:      206 pounds BMI:     27.28 Pulse rate:   64 / minute BP sitting:   118 / 70  (right arm)  Vitals Entered By: Dreama Saa, CNA (July 28, 2010 11:21 AM)  Physical Exam  General:  Well developed, well nourished, in no acute distress. Head:  normocephalic and atraumatic Eyes:  PERRLA/EOM intact; conjunctiva and lids normal. Lungs:  Clear bilaterally to auscultation and percussion. Heart:  PMI non displaced.  Normal S1 and S2.  No definite murmur. Abdomen:  Bowel sounds positive; abdomen soft and non-tender without masses,  organomegaly,  or hernias noted. No hepatosplenomegaly. Extremities:  No clubbing or cyanosis. Neurologic:  Alert and oriented x 3.   EKG  Procedure date:  07/28/2010  Findings:      SB.    Delay in R wave progression.  Otherwise normal  Impression & Recommendations:  Problem # 1:  CORONARY ARTERY DISEASE (ICD-414.00) stable at present time.  No significant symptoms.  will contine to monitor and he will remain on ASA> His updated medication list for this problem includes:    Aspirin 81 Mg Tbec (Aspirin) .Marland Kitchen... Take one tablet by mouth daily  Problem # 2:  HYPERLIPIDEMIA (ICD-272.4) LDL at target on the care of Dr. Jonny Ruiz. His updated medication list for this problem includes:    Zetia 10 Mg Tabs (Ezetimibe) .Marland Kitchen... Take 1 tablet by mouth once a day    Pravachol 80 Mg Tabs (Pravastatin sodium) .Marland Kitchen... Take 1 tablet by mouth once a day  Patient Instructions: 1)  Your physician wants you to follow-up in:ONE YEAR   You will receive a reminder letter in the mail two months in advance. If you don't receive a letter, please call our office to schedule the follow-up appointment.

## 2010-10-05 NOTE — Progress Notes (Signed)
Summary: Schedule Colonoscopy  Phone Note Outgoing Call Call back at Home Phone [phone]   Call placed by: Harlow Mares CMA Duncan Dull),  June 21, 2010 12:49 PM Call placed to: Patient Summary of Call: patients wife aware he needs colonoscopy. she will have him call back I gave her the number and Dr. Anselm Jungling name.  Initial call taken by: Harlow Mares CMA Duncan Dull),  June 21, 2010 12:58 PM  Follow-up for Phone Call        pt called back and said thathe "doesnt need a COL right now" Follow-up by: Harlow Mares CMA Duncan Dull),  June 24, 2010 4:35 PM

## 2010-10-05 NOTE — Consult Note (Signed)
Summary: [facility]  [facility]   Imported By: Sherian Rein 06/17/2010 12:08:24  _____________________________________________________________________  External Attachment:    Type:   Image     Comment:   External Document

## 2010-10-05 NOTE — Progress Notes (Signed)
  Phone Note Refill Request Message from:  Patient on July 20, 2010 11:23 AM  Refills Requested: Medication #1:  PRAVACHOL 80 MG  TABS Take 1 tablet by mouth once a day   Dosage confirmed as above?Dosage Confirmed   Supply Requested: 1 year  Method Requested: Pick up at Office Initial call taken by: Margaret Pyle, CMA,  July 20, 2010 11:23 AM  Follow-up for Phone Call        Pt informed, Rx in cabinet for ptpick up Follow-up by: Margaret Pyle, CMA,  July 20, 2010 1:09 PM    Prescriptions: PRAVACHOL 80 MG  TABS (PRAVASTATIN SODIUM) Take 1 tablet by mouth once a day  #90 x 3   Entered by:   Margaret Pyle, CMA   Authorized by:   Corwin Levins MD   Signed by:   Margaret Pyle, CMA on 07/20/2010   Method used:   Print then Give to Patient   RxID:   [id number]
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
## 2010-10-05 NOTE — Letter (Signed)
Summary: [facility]   Imported By: Sherian Rein 08/06/2010 08:37:35  _____________________________________________________________________  External Attachment:    Type:   Image     Comment:   External Document
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
## 2010-10-05 NOTE — Consult Note (Signed)
Summary: [facility]  [facility]   Imported By: Lester Grayson 06/15/2010 07:24:02  _____________________________________________________________________  External Attachment:    Type:   Image     Comment:   External Document

## 2010-10-05 NOTE — Letter (Signed)
Summary: [facility]   Imported By: Sherian Rein 11/23/2009 14:42:06  _____________________________________________________________________  External Attachment:    Type:   Image     Comment:   External Document

## 2010-10-05 NOTE — Letter (Signed)
Summary: Colonoscopy Letter  [facility]  [address] [place], [place] [address]   Phone: [phone]  Fax: [phone]      October 13, 2009 MRN: [id number]   Alex Higgins [address] [place], [place]  [address]   Dear Mr. Daloia,   According to your medical record, it is time for you to schedule a Colonoscopy. The American Cancer Society recommends this procedure as a method to detect early colon cancer. Patients with a family history of colon cancer, or a personal history of colon polyps or inflammatory bowel disease are at increased risk.  This letter has beeen generated based on the recommendations made at the time of your procedure. If you feel that in your particular situation this may no longer apply, please contact our office.  Please call our office at [phone] to schedule this appointment or to update your records at your earliest convenience.  Thank you for cooperating with [place] to provide you with the very best care possible.   Sincerely,  Judie Petit T. Russella Dar, M.D.  [facility] Gastroenterology Division [phone]

## 2010-10-07 NOTE — Letter (Signed)
Summary: [facility]   Imported By: Sherian Rein 09/01/2010 09:35:01  _____________________________________________________________________  External Attachment:    Type:   Image     Comment:   External Document
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
## 2010-10-07 NOTE — Assessment & Plan Note (Signed)
Summary: 6 MONTH ROV   Visit Type:  6 months follow up Referring Provider:  i knapp Primary Provider:  Oliver Barre, M.D.  CC:  Chest pains relief with deep breath.  History of Present Illness: Has ocassional chest pain, takes deep breath of air with relief of symptoms.  No exerion related symptoms.    Current Medications (verified): 1)  Celexa 20 Mg  Tabs (Citalopram Hydrobromide) .Marland Kitchen.. 1 By Mouth Once Daily 2)  Zetia 10 Mg  Tabs (Ezetimibe) .... Take 1 Tablet By Mouth Once A Day 3)  Pravachol 80 Mg  Tabs (Pravastatin Sodium) .... Take 1 Tablet By Mouth Once A Day 4)  Finasteride 5 Mg  Tabs (Finasteride) .... Take 1 Tablet By Mouth Once A Day 5)  Omeprazole 20 Mg Cpdr (Omeprazole) .Marland Kitchen.. 1po Once Daily 6)  Aspirin 81 Mg Tbec (Aspirin) .... Take One Tablet By Mouth Daily 7)  Oxycodone Hcl 5 Mg Caps (Oxycodone Hcl) .Marland Kitchen.. 1 - 2 By Mouth Q 6hrs As Needed Pain 8)  Metformin Hcl 500 Mg Xr24h-Tab (Metformin Hcl) .Marland Kitchen.. 1po  Once Daily  Allergies: 1)  ! Novocain 2)  ! * Uroxatrol 3)  ! * Rapaflo 4)  ! * Avodart  Vital Signs:  Patient profile:   77 year old male Height:      73 inches Weight:      203.25 pounds BMI:     26.91 Pulse rate:   61 / minute Pulse rhythm:   irregular Resp:     18 per minute BP supine:   130 / 80 BP sitting:   105 / 59  (left arm) Cuff size:   large  Vitals Entered By: Vikki Ports (Jan 14, 2010 4:02 PM)  Physical Exam  General:  Well developed, well nourished, in no acute distress. Head:  normocephalic and atraumatic Eyes:  PERRLA/EOM intact; conjunctiva and lids normal. Neck:  No definite bruit. Breasts:  some tenderness r breast.  no def mass. Lungs:  Clear bilaterally to auscultation and percussion. Heart:  PMI.  Normal S1 and S2 Abdomen:  Bowel sounds positive; abdomen soft and non-tender without masses, organomegaly, or hernias noted. No hepatosplenomegaly. Pulses:  pulses normal in all 4 extremities Extremities:  No clubbing or  cyanosis. Neurologic:  Alert and oriented x 3.   EKG  Procedure date:  01/14/2010  Findings:      NSR. First degree av block.  Low voltage QRS.  Impression & Recommendations:  Problem # 1:  CORONARY ARTERY DISEASE (ICD-414.00) stable at present. No recurrent symptoms.  CHest discomfort is atypical, and not likely ischemic. His updated medication list for this problem includes:    Aspirin 81 Mg Tbec (Aspirin) .Marland Kitchen... Take one tablet by mouth daily  Orders: EKG w/ Interpretation (93000)  Problem # 2:  POSTURAL HYPOTENSION (ICD-458.0) Discussed at length with patient.  Need for hydration, preventiion of volume depletion discussed.   Problem # 3:  HYPERLIPIDEMIA (ICD-272.4) tolerating His updated medication list for this problem includes:    Zetia 10 Mg Tabs (Ezetimibe) .Marland Kitchen... Take 1 tablet by mouth once a day    Pravachol 80 Mg Tabs (Pravastatin sodium) .Marland Kitchen... Take 1 tablet by mouth once a day  Patient Instructions: 1)  Your physician recommends that you continue on your current medications as directed. Please refer to the Current Medication list given to you today. 2)  Your physician wants you to follow-up in:   6 MONTHS. You will receive a reminder letter in the mail two months  in advance. If you don't receive a letter, please call our office to schedule the follow-up appointment.

## 2010-11-02 ENCOUNTER — Telehealth: Payer: Self-pay | Admitting: Internal Medicine

## 2010-11-11 NOTE — Progress Notes (Signed)
Summary: Rx req  Phone Note Call from Patient Call back at Home Phone [phone]   Caller: Patient Summary of Call: Pt called requesting hard copy rxs for Pravachol, Celexa, Prilosec and Zetia Initial call taken by: Margaret Pyle, CMA,  November 02, 2010 10:35 AM  Follow-up for Phone Call        done hardcopy to LIM side B - dahlia  Follow-up by: Corwin Levins MD,  November 02, 2010 1:08 PM  Additional Follow-up for Phone Call Additional follow up Details #1::        Pt's spouse advised, Rx in cabinet for pt pick up Additional Follow-up by: Margaret Pyle, CMA,  November 02, 2010 1:48 PM    Prescriptions: OMEPRAZOLE 20 MG CPDR (OMEPRAZOLE) 1po once two times a day  #180 x 3   Entered by:   Margaret Pyle, CMA   Authorized by:   Corwin Levins MD   Signed by:   Margaret Pyle, CMA on 11/02/2010   Method used:   Print then Give to Patient   RxID:   [id number] PRAVACHOL 80 MG  TABS (PRAVASTATIN SODIUM) Take 1 tablet by mouth once a day  #90 x 3   Entered by:   Margaret Pyle, CMA   Authorized by:   Corwin Levins MD   Signed by:   Margaret Pyle, CMA on 11/02/2010   Method used:   Print then Give to Patient   RxID:   [id number] ZETIA 10 MG  TABS (EZETIMIBE) Take 1 tablet by mouth once a day  #90 x 3   Entered by:   Margaret Pyle, CMA   Authorized by:   Corwin Levins MD   Signed by:   Margaret Pyle, CMA on 11/02/2010   Method used:   Print then Give to Patient   RxID:   [id number] CELEXA 20 MG  TABS (CITALOPRAM HYDROBROMIDE) 1 by mouth once daily  #90 x 3   Entered by:   Margaret Pyle, CMA   Authorized by:   Corwin Levins MD   Signed by:   Margaret Pyle, CMA on 11/02/2010   Method used:   Print then Give to Patient   RxID:   [id number]

## 2010-12-08 IMAGING — CR DG CHEST 2V
2 series · 2 of 2 positions shown · non-contrast
Comparison: 12/03/2007

CLINICAL DATA: Acute bronchitis

CHEST - 2 VIEW

[view not recorded (1 of 2)]
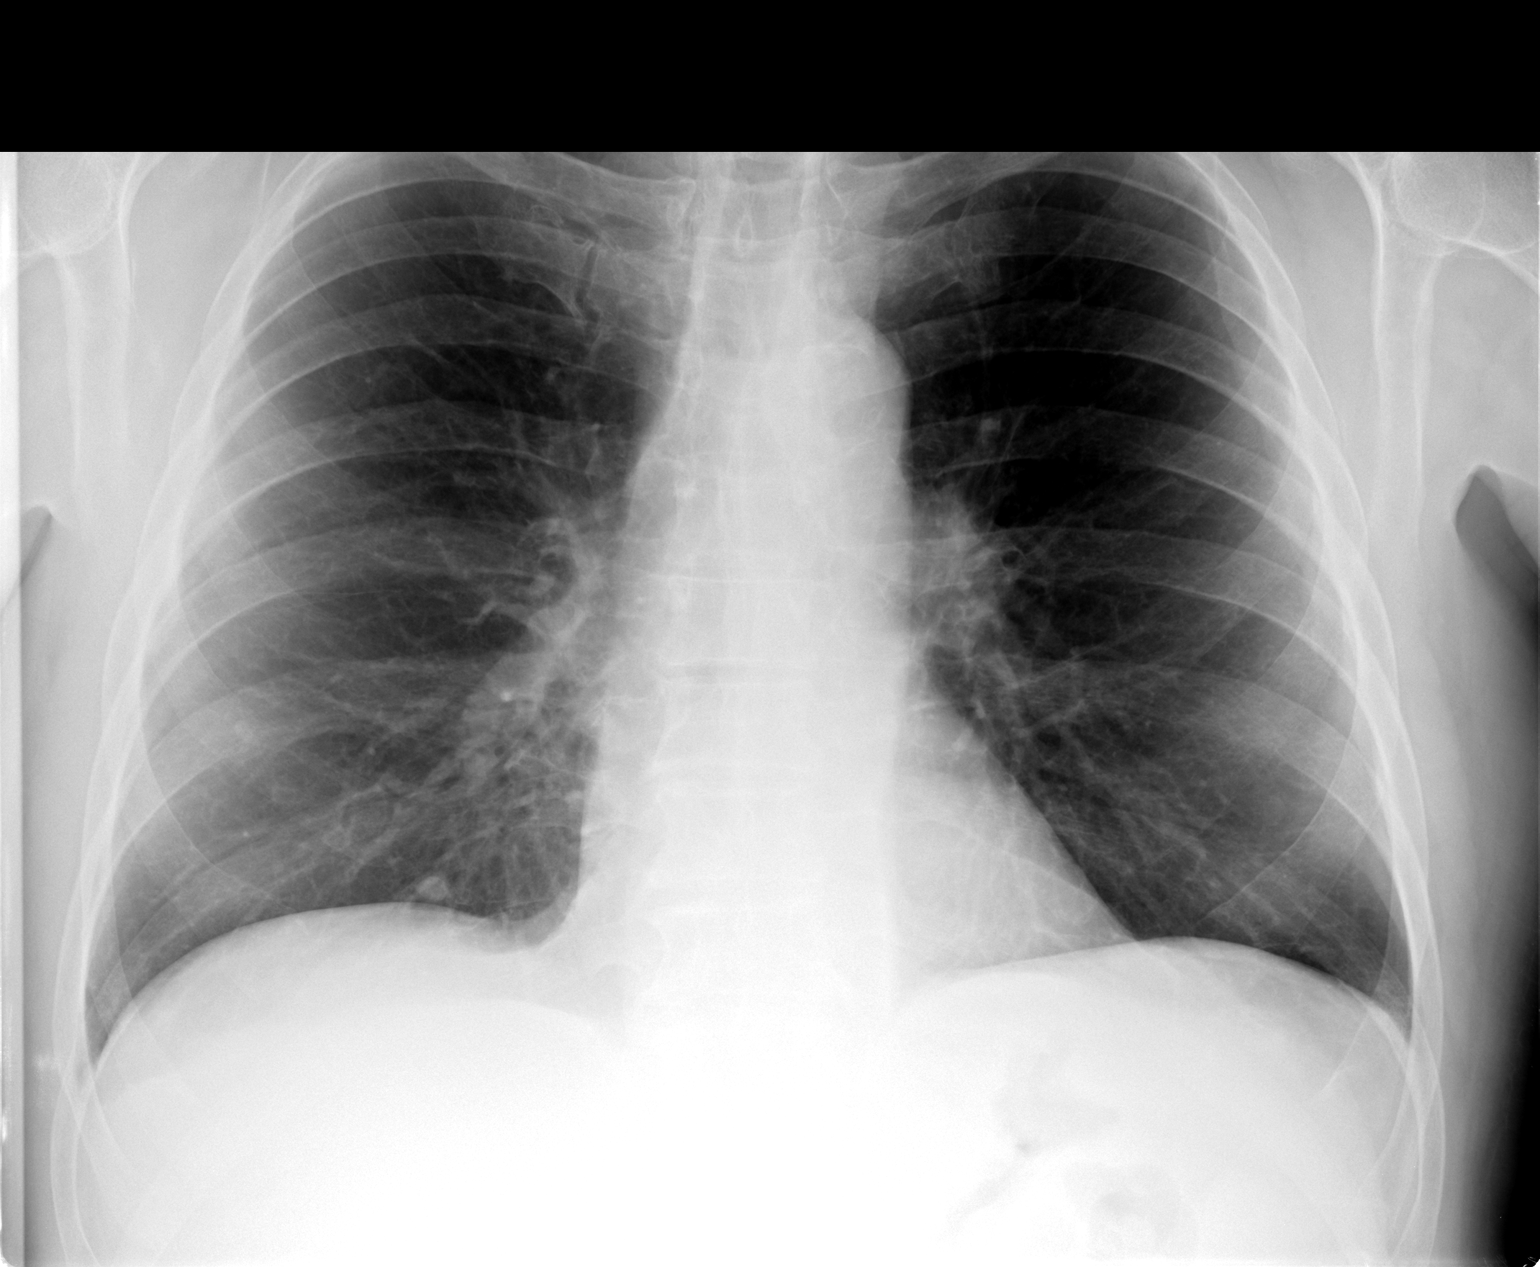

[view not recorded (2 of 2)]
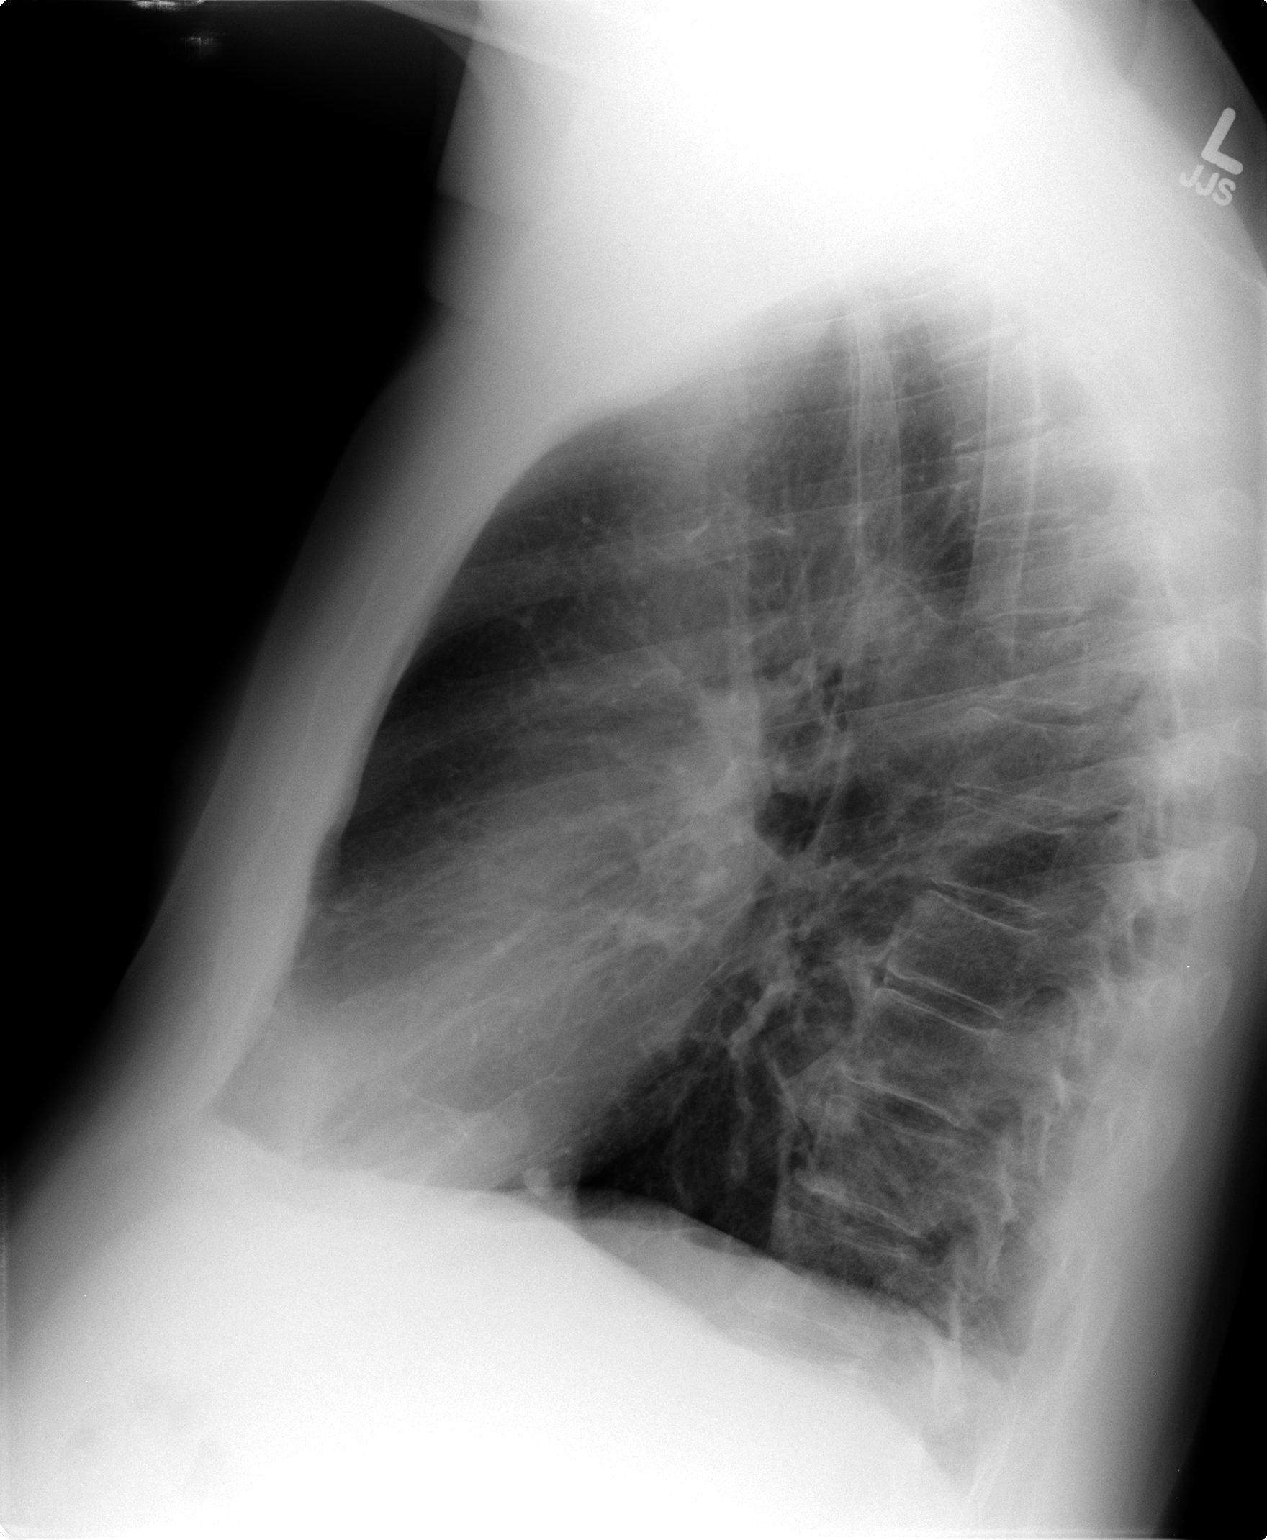

[2 of 2 positions shown; findings below may reference images not displayed]

FINDINGS: Heart size is normal.

No pleural effusions or pulmonary edema.

No airspace densities identified.

Two calcified granulomas are noted within the right base.
IMPRESSION: 1.  No acute findings.

## 2010-12-12 LAB — URINALYSIS, ROUTINE W REFLEX MICROSCOPIC
Bilirubin Urine: NEGATIVE
Ketones, ur: NEGATIVE mg/dL
Nitrite: NEGATIVE
Urobilinogen, UA: 1 mg/dL (ref 0.0–1.0)
pH: 7 (ref 5.0–8.0)

## 2010-12-12 LAB — DIFFERENTIAL
Basophils Absolute: 0 10*3/uL (ref 0.0–0.1)
Basophils Relative: 1 % (ref 0–1)
Eosinophils Absolute: 0.2 10*3/uL (ref 0.0–0.7)
Eosinophils Relative: 3 % (ref 0–5)
Lymphocytes Relative: 41 % (ref 12–46)

## 2010-12-12 LAB — CBC
HCT: 42.3 % (ref 39.0–52.0)
MCHC: 34.3 g/dL (ref 30.0–36.0)
MCV: 94.8 fL (ref 78.0–100.0)
Platelets: 169 10*3/uL (ref 150–400)
RDW: 14.6 % (ref 11.5–15.5)

## 2010-12-12 LAB — BASIC METABOLIC PANEL
BUN: 14 mg/dL (ref 6–23)
GFR calc non Af Amer: >60 mL/min (ref >60)
Glucose, Bld: 163 mg/dL — ABNORMAL HIGH (ref 70–99)
Potassium: 4.2 mEq/L (ref 3.5–5.1)

## 2010-12-13 LAB — BASIC METABOLIC PANEL
BUN: 14 mg/dL (ref 6–23)
BUN: 16 mg/dL (ref 6–23)
BUN: 20 mg/dL (ref 6–23)
CO2: 24 mEq/L (ref 19–32)
CO2: 27 mEq/L (ref 19–32)
Calcium: 7.9 mg/dL — ABNORMAL LOW (ref 8.4–10.5)
Chloride: 100 mEq/L (ref 96–112)
Chloride: 102 mEq/L (ref 96–112)
Chloride: 103 mEq/L (ref 96–112)
Chloride: 98 mEq/L (ref 96–112)
Creatinine, Ser: 1.61 mg/dL — ABNORMAL HIGH (ref 0.4–1.5)
GFR calc Af Amer: 51 mL/min — ABNORMAL LOW (ref >60)
GFR calc Af Amer: >60 mL/min (ref >60)
GFR calc non Af Amer: 49 mL/min — ABNORMAL LOW (ref >60)
GFR calc non Af Amer: 58 mL/min — ABNORMAL LOW (ref >60)
Glucose, Bld: 164 mg/dL — ABNORMAL HIGH (ref 70–99)
Glucose, Bld: 167 mg/dL — ABNORMAL HIGH (ref 70–99)
Glucose, Bld: 173 mg/dL — ABNORMAL HIGH (ref 70–99)
Glucose, Bld: 235 mg/dL — ABNORMAL HIGH (ref 70–99)
Potassium: 3.9 mEq/L (ref 3.5–5.1)
Potassium: 4.1 mEq/L (ref 3.5–5.1)
Potassium: 4.1 mEq/L (ref 3.5–5.1)
Potassium: 4.5 mEq/L (ref 3.5–5.1)
Potassium: 4.7 mEq/L (ref 3.5–5.1)
Potassium: 5.1 mEq/L (ref 3.5–5.1)
Sodium: 129 mEq/L — ABNORMAL LOW (ref 135–145)
Sodium: 132 mEq/L — ABNORMAL LOW (ref 135–145)
Sodium: 133 mEq/L — ABNORMAL LOW (ref 135–145)
Sodium: 135 mEq/L (ref 135–145)

## 2010-12-13 LAB — DIFFERENTIAL
Basophils Absolute: 0 10*3/uL (ref 0.0–0.1)
Basophils Relative: 0 % (ref 0–1)
Lymphs Abs: 0.6 10*3/uL — ABNORMAL LOW (ref 0.7–4.0)
Monocytes Relative: 9 % (ref 3–12)
Neutro Abs: 4.8 10*3/uL (ref 1.7–7.7)
Neutro Abs: 8.9 10*3/uL — ABNORMAL HIGH (ref 1.7–7.7)
Neutrophils Relative %: 81 % — ABNORMAL HIGH (ref 43–77)
Neutrophils Relative %: 81 % — ABNORMAL HIGH (ref 43–77)

## 2010-12-13 LAB — GLUCOSE, CAPILLARY
Glucose-Capillary: 146 mg/dL — ABNORMAL HIGH (ref 70–99)
Glucose-Capillary: 161 mg/dL — ABNORMAL HIGH (ref 70–99)
Glucose-Capillary: 104 mg/dL — ABNORMAL HIGH (ref 70–99)
Glucose-Capillary: 146 mg/dL — ABNORMAL HIGH (ref 70–99)
Glucose-Capillary: 149 mg/dL — ABNORMAL HIGH (ref 70–99)
Glucose-Capillary: 151 mg/dL — ABNORMAL HIGH (ref 70–99)
Glucose-Capillary: 152 mg/dL — ABNORMAL HIGH (ref 70–99)
Glucose-Capillary: 162 mg/dL — ABNORMAL HIGH (ref 70–99)
Glucose-Capillary: 162 mg/dL — ABNORMAL HIGH (ref 70–99)
Glucose-Capillary: 164 mg/dL — ABNORMAL HIGH (ref 70–99)
Glucose-Capillary: 178 mg/dL — ABNORMAL HIGH (ref 70–99)
Glucose-Capillary: 178 mg/dL — ABNORMAL HIGH (ref 70–99)
Glucose-Capillary: 193 mg/dL — ABNORMAL HIGH (ref 70–99)
Glucose-Capillary: 194 mg/dL — ABNORMAL HIGH (ref 70–99)
Glucose-Capillary: 194 mg/dL — ABNORMAL HIGH (ref 70–99)
Glucose-Capillary: 203 mg/dL — ABNORMAL HIGH (ref 70–99)

## 2010-12-13 LAB — POCT CARDIAC MARKERS: Troponin i, poc: <0.05 ng/mL (ref 0.00–0.09)

## 2010-12-13 LAB — DIRECT ANTIGLOBULIN TEST (NOT AT ARMC): DAT, IgG: NEGATIVE

## 2010-12-13 LAB — CARDIAC PANEL(CRET KIN+CKTOT+MB+TROPI)
CK, MB: 0.6 ng/mL (ref 0.3–4.0)
Relative Index: INVALID (ref 0.0–2.5)
Relative Index: INVALID (ref 0.0–2.5)
Relative Index: INVALID (ref 0.0–2.5)
Troponin I: 0.01 ng/mL (ref 0.00–0.06)
Troponin I: 0.02 ng/mL (ref 0.00–0.06)
Troponin I: 0.02 ng/mL (ref 0.00–0.06)

## 2010-12-13 LAB — CBC
HCT: 32.5 % — ABNORMAL LOW (ref 39.0–52.0)
HCT: 34.1 % — ABNORMAL LOW (ref 39.0–52.0)
HCT: 39.3 % (ref 39.0–52.0)
HCT: 39.6 % (ref 39.0–52.0)
Hemoglobin: 11.1 g/dL — ABNORMAL LOW (ref 13.0–17.0)
Hemoglobin: 12.2 g/dL — ABNORMAL LOW (ref 13.0–17.0)
Hemoglobin: 13.7 g/dL (ref 13.0–17.0)
MCHC: 33.6 g/dL (ref 30.0–36.0)
MCHC: 34.3 g/dL (ref 30.0–36.0)
MCHC: 34.9 g/dL (ref 30.0–36.0)
MCV: 92.3 fL (ref 78.0–100.0)
MCV: 93.8 fL (ref 78.0–100.0)
Platelets: 112 10*3/uL — ABNORMAL LOW (ref 150–400)
Platelets: 73 10*3/uL — ABNORMAL LOW (ref 150–400)
Platelets: 94 10*3/uL — ABNORMAL LOW (ref 150–400)
RBC: 3.48 MIL/uL — ABNORMAL LOW (ref 4.22–5.81)
RBC: 4.24 MIL/uL (ref 4.22–5.81)
RBC: 5.69 MIL/uL (ref 4.22–5.81)
RDW: 13 % (ref 11.5–15.5)
RDW: 13.1 % (ref 11.5–15.5)
RDW: 13.7 % (ref 11.5–15.5)
RDW: 13.8 % (ref 11.5–15.5)
WBC: 4 10*3/uL (ref 4.0–10.5)
WBC: 5.9 10*3/uL (ref 4.0–10.5)
WBC: 9.9 10*3/uL (ref 4.0–10.5)

## 2010-12-13 LAB — URINALYSIS, MICROSCOPIC ONLY
Glucose, UA: 500 mg/dL — AB
Hgb urine dipstick: NEGATIVE
Protein, ur: NEGATIVE mg/dL
Specific Gravity, Urine: 1.023 (ref 1.005–1.030)
pH: 6.5 (ref 5.0–8.0)

## 2010-12-13 LAB — COMPREHENSIVE METABOLIC PANEL
AST: 45 U/L — ABNORMAL HIGH (ref 0–37)
Albumin: 2.2 g/dL — ABNORMAL LOW (ref 3.5–5.2)
Alkaline Phosphatase: 44 U/L (ref 39–117)
Alkaline Phosphatase: 48 U/L (ref 39–117)
BUN: 14 mg/dL (ref 6–23)
BUN: 20 mg/dL (ref 6–23)
Calcium: 9.2 mg/dL (ref 8.4–10.5)
Chloride: 105 mEq/L (ref 96–112)
Chloride: 99 mEq/L (ref 96–112)
GFR calc Af Amer: >60 mL/min (ref >60)
Glucose, Bld: 183 mg/dL — ABNORMAL HIGH (ref 70–99)
Glucose, Bld: 264 mg/dL — ABNORMAL HIGH (ref 70–99)
Potassium: 3.3 mEq/L — ABNORMAL LOW (ref 3.5–5.1)
Potassium: 3.3 mEq/L — ABNORMAL LOW (ref 3.5–5.1)
Sodium: 134 mEq/L — ABNORMAL LOW (ref 135–145)
Sodium: 135 mEq/L (ref 135–145)
Total Bilirubin: 1.4 mg/dL — ABNORMAL HIGH (ref 0.3–1.2)
Total Bilirubin: 1.7 mg/dL — ABNORMAL HIGH (ref 0.3–1.2)
Total Protein: 6.8 g/dL (ref 6.0–8.3)

## 2010-12-13 LAB — CULTURE, BLOOD (ROUTINE X 2)
Culture: NO GROWTH
Culture: NO GROWTH
Culture: NO GROWTH
Culture: NO GROWTH
Culture: NO GROWTH

## 2010-12-13 LAB — CK TOTAL AND CKMB (NOT AT ARMC)
CK, MB: 0.8 ng/mL (ref 0.3–4.0)
CK, MB: 0.8 ng/mL (ref 0.3–4.0)
CK, MB: 0.9 ng/mL (ref 0.3–4.0)
Relative Index: INVALID (ref 0.0–2.5)
Relative Index: INVALID (ref 0.0–2.5)
Relative Index: INVALID (ref 0.0–2.5)
Total CK: 32 U/L (ref 7–232)
Total CK: 32 U/L (ref 7–232)
Total CK: 37 U/L (ref 7–232)

## 2010-12-13 LAB — T4, FREE: Free T4: 1.21 ng/dL (ref 0.80–1.80)

## 2010-12-13 LAB — CMV ABS, IGG+IGM (CYTOMEGALOVIRUS)
CMV IgM: <8 AU/mL (ref <30.0)
Cytomegalovirus Ab-IgG: >10 U/mL — ABNORMAL HIGH (ref <0.4)

## 2010-12-13 LAB — LIPID PANEL
Cholesterol: 127 mg/dL (ref 0–200)
HDL: 26 mg/dL — ABNORMAL LOW (ref >39)

## 2010-12-13 LAB — IRON AND TIBC
Iron: 18 ug/dL — ABNORMAL LOW (ref 42–135)
TIBC: 136 ug/dL — ABNORMAL LOW (ref 215–435)

## 2010-12-13 LAB — URINE MICROSCOPIC-ADD ON

## 2010-12-13 LAB — HIGH SENSITIVITY CRP: CRP, High Sensitivity: 33.1 mg/L — ABNORMAL HIGH

## 2010-12-13 LAB — VITAMIN B12: Vitamin B-12: 281 pg/mL (ref 211–911)

## 2010-12-13 LAB — TROPONIN I: Troponin I: 0.02 ng/mL (ref 0.00–0.06)

## 2010-12-13 LAB — URINALYSIS, ROUTINE W REFLEX MICROSCOPIC
Glucose, UA: 500 mg/dL — AB
Glucose, UA: >1000 mg/dL — AB
Hgb urine dipstick: NEGATIVE
Leukocytes, UA: NEGATIVE
Protein, ur: 30 mg/dL — AB
Protein, ur: NEGATIVE mg/dL
Specific Gravity, Urine: 1.029 (ref 1.005–1.030)
pH: 6.5 (ref 5.0–8.0)

## 2010-12-13 LAB — VITAMIN D 1,25 DIHYDROXY
Vitamin D 1, 25 (OH)2 Total: 24 pg/mL (ref 18–72)
Vitamin D2 1, 25 (OH)2: <8 pg/mL
Vitamin D3 1, 25 (OH)2: 24 pg/mL

## 2010-12-13 LAB — URINE CULTURE
Colony Count: 60000
Colony Count: NO GROWTH
Special Requests: NEGATIVE

## 2010-12-13 LAB — POTASSIUM: Potassium: 5.4 mEq/L — ABNORMAL HIGH (ref 3.5–5.1)

## 2010-12-13 LAB — RETICULOCYTES: Retic Count, Absolute: 33.5 10*3/uL (ref 19.0–186.0)

## 2010-12-13 LAB — PARVOVIRUS B19 ANTIBODY, IGG AND IGM: Parovirus B19 IgG Abs: 3.2 Index — ABNORMAL HIGH (ref <0.9)

## 2010-12-13 LAB — BRAIN NATRIURETIC PEPTIDE: Pro B Natriuretic peptide (BNP): <30 pg/mL (ref 0.0–100.0)

## 2010-12-13 LAB — ANA: Anti Nuclear Antibody(ANA): NEGATIVE

## 2010-12-13 LAB — GLUCOSE 6 PHOSPHATE DEHYDROGENASE: G-6-PD, Quant: 10 U/g{Hb} (ref 7–20)

## 2010-12-13 LAB — EBV AB TO VIRAL CAPSID AG PNL, IGG+IGM: EBV VCA IgG: 7.68 {ISR} — ABNORMAL HIGH

## 2010-12-23 IMAGING — CT CT HEAD W/O CM
1 of 2 series · 16 of 30 positions shown, 20 images · non-contrast
Comparison: 02/10/2009

CLINICAL DATA: Headache.  Possible stroke.  Syncope.

CT HEAD WITHOUT CONTRAST
TECHNIQUE: Contiguous axial images were obtained from the base of
the skull through the vertex without contrast.

[Series 2: head_seq 4.5 h37s st · axial · 0.43mm/px · z∈[-168,-24]mm · 16 of 36 slices shown, 20 images]
[im 2/36  brain]
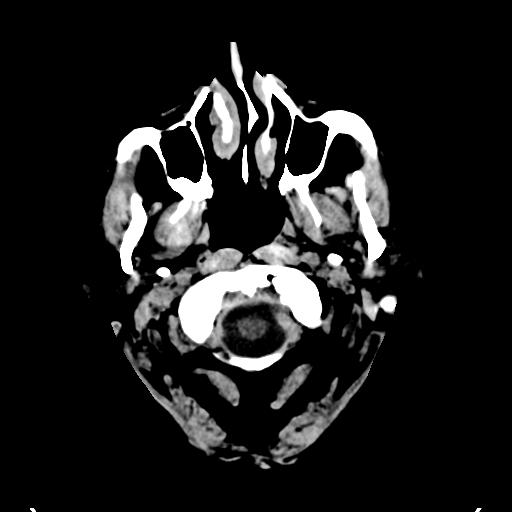
[im 2/36  bone]
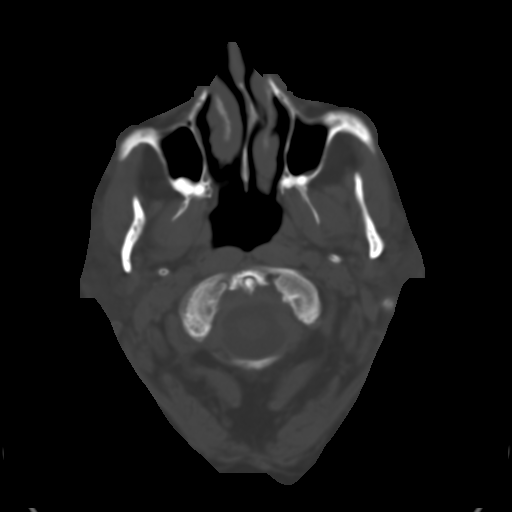
[im 4/36  brain]
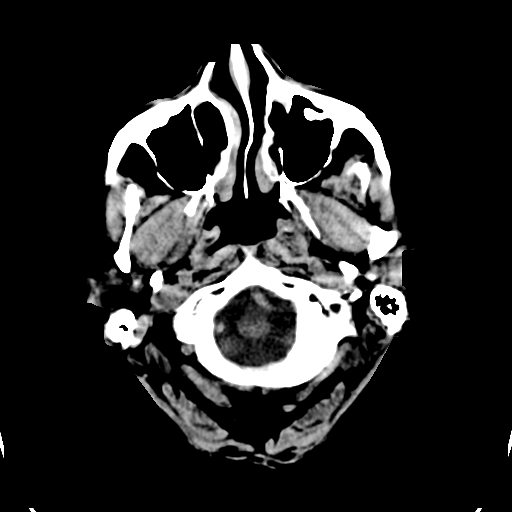
[im 6/36  brain]
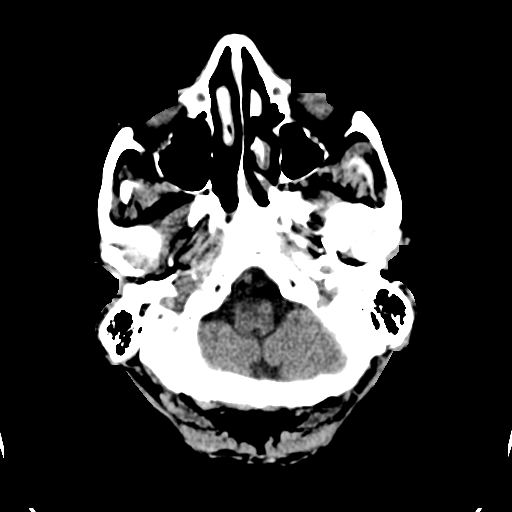
[im 9/36  brain]
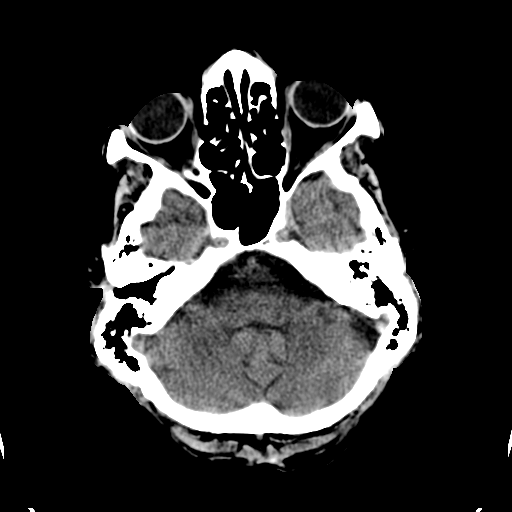
[im 11/36  brain]
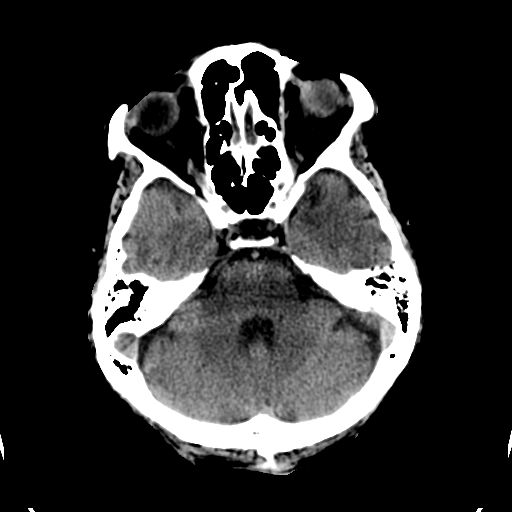
[im 11/36  bone]
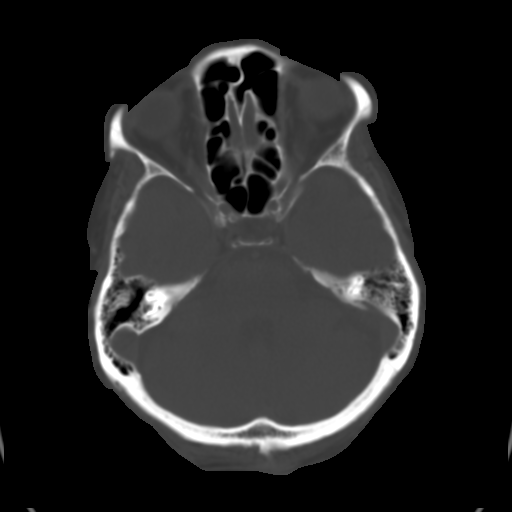
[im 13/36  brain]
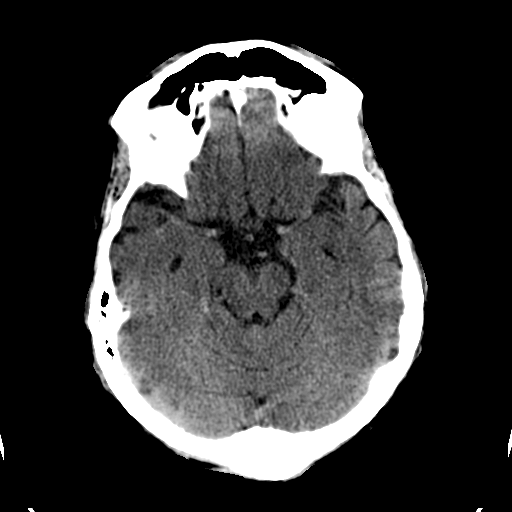
[im 15/36  brain]
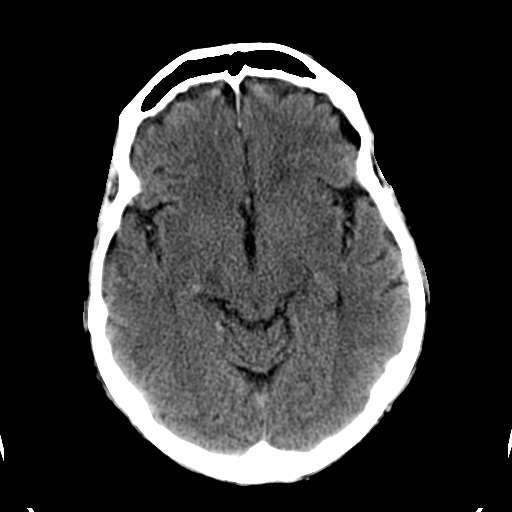
[im 16/36  brain]
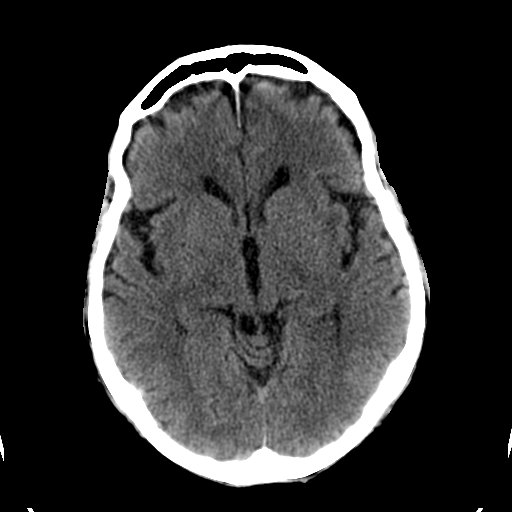
[im 20/36  brain]
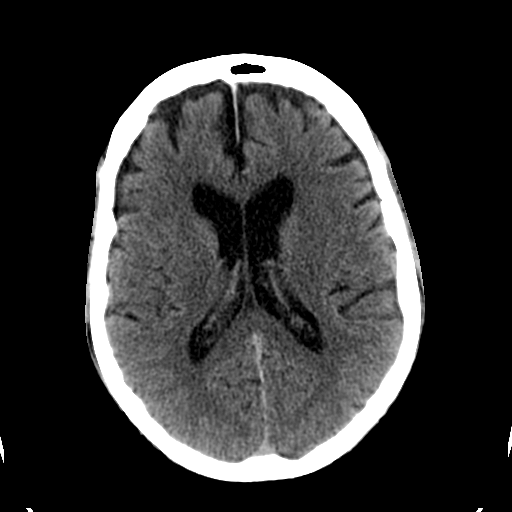
[im 20/36  bone]
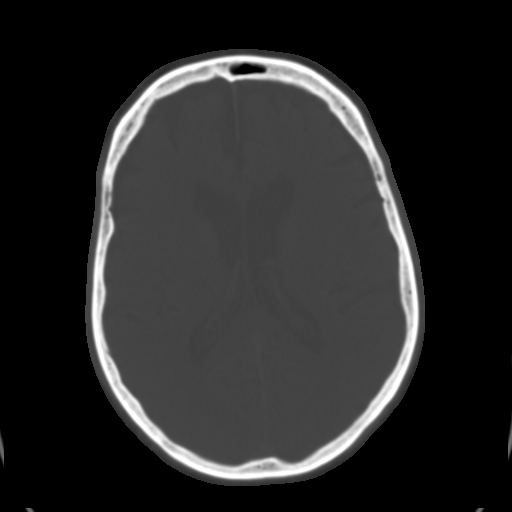
[im 22/36  brain]
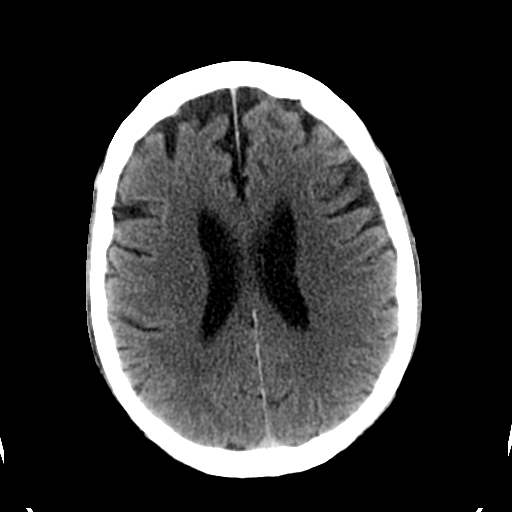
[im 23/36  brain]
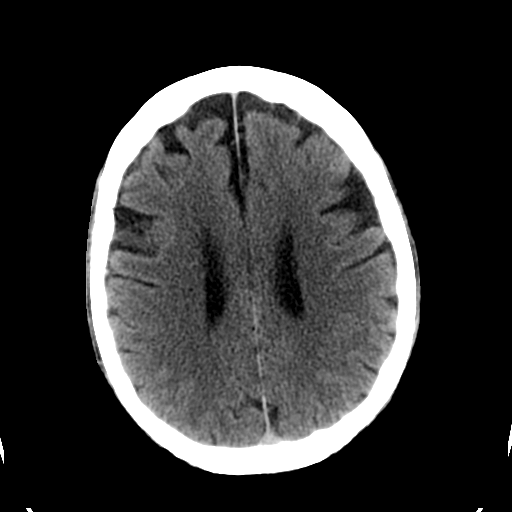
[im 25/36  brain]
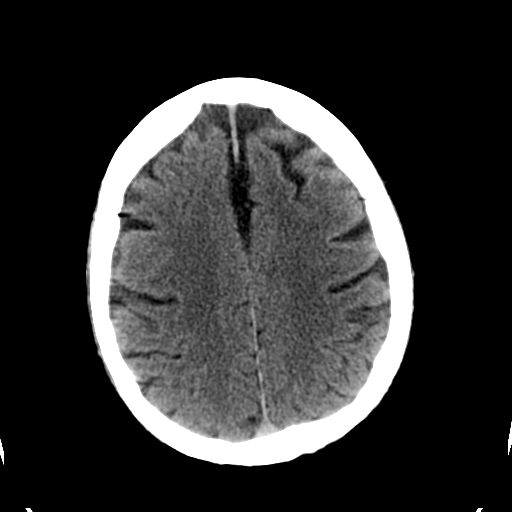
[im 27/36  brain]
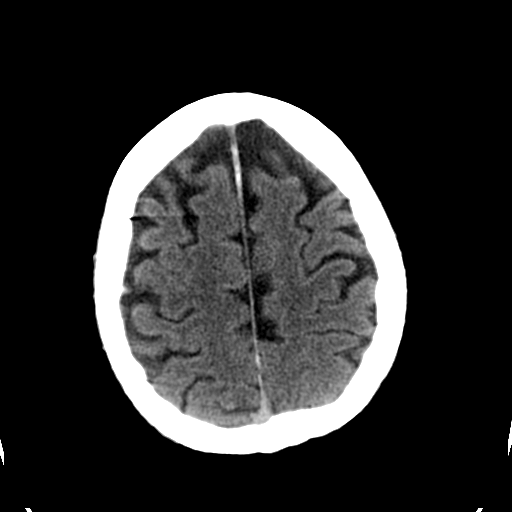
[im 27/36  bone]
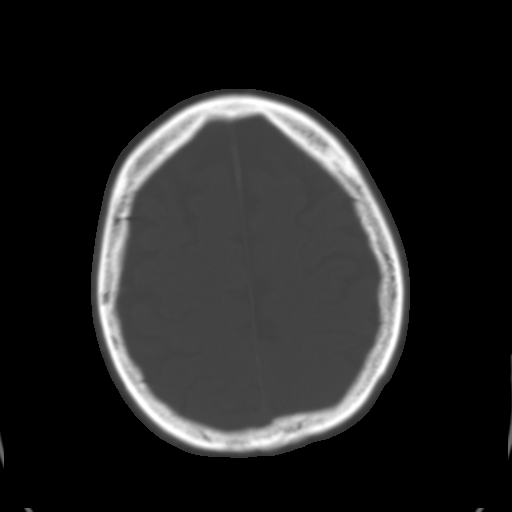
[im 30/36  brain]
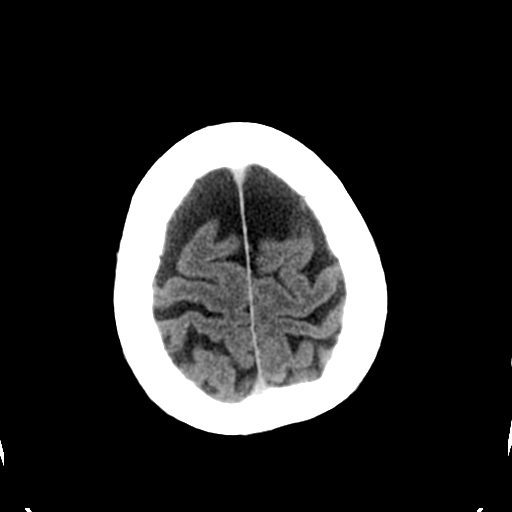
[im 32/36  brain]
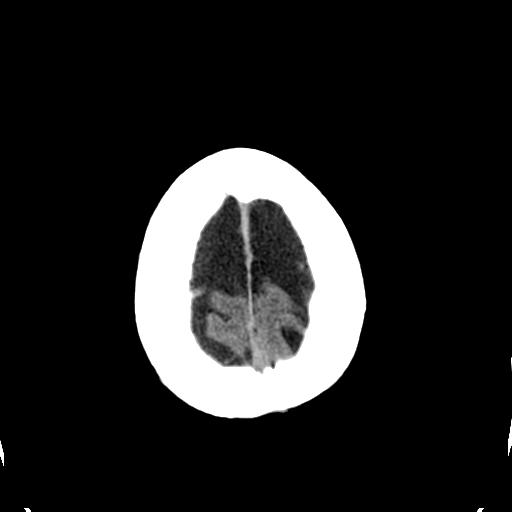
[im 34/36  brain]
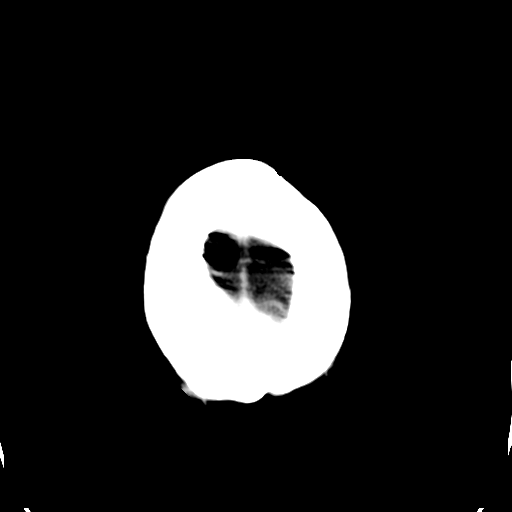

[16 of 30 positions shown; findings below may reference images not displayed]

FINDINGS: There is mild age related atrophy.  No evidence of old or
acute small or large vessel infarction.  No mass lesion,
hemorrhage, hydrocephalus or extra-axial collection.  The calvarium
is unremarkable.  The sinuses, middle ears and mastoids are clear.
IMPRESSION: Mild age related atrophy.  No focal or acute finding.

## 2011-01-04 ENCOUNTER — Inpatient Hospital Stay (INDEPENDENT_AMBULATORY_CARE_PROVIDER_SITE_OTHER)
Admission: RE | Admit: 2011-01-04 | Discharge: 2011-01-04 | Disposition: A | Discharge status: Home / Self Care | Payer: Medicare Other | Source: Ambulatory Visit | Attending: Emergency Medicine | Admitting: Emergency Medicine

## 2011-01-04 ENCOUNTER — Encounter: Payer: Self-pay | Admitting: Emergency Medicine

## 2011-01-04 DIAGNOSIS — H6120 Impacted cerumen, unspecified ear: Secondary | ICD-10-CM

## 2011-01-18 NOTE — Assessment & Plan Note (Signed)
[facility]                            CARDIOLOGY OFFICE NOTE   JEMIAH, ELLENBURG                    MRN:          [id number]  DATE:11/24/2008                            DOB:          [birth year]    Alex Higgins is in for a followup visit.  He generally is stable.  He  wants to be seen in a year but no sooner.  He has not had any more these  spells.  He generally feels quite good.   CURRENT MEDICATIONS:  1. Celexa 20 mg daily.  2. Zetia 10 mg daily.  3. Pravachol 8 mg daily.  4. Finasteride 5 mg daily.  5. Aspirin 81 mg daily.  6. Prilosec 20 mg daily.   On physical, blood pressure is 198/60, rechecked by me was 120/70.  There was no significant drop.  I do not appreciate subclavian bruits  and he has got excellent pulses bilaterally.  His two-dimensional  echocardiogram suggested an ejection fraction of 55-60%.  Overall, it  looked pretty good.  There was no diastolic murmurs heard but no major  abnormalities.   Mr. Depaz says he thinks he is doing well and does not want to be  seen for another years.  He has been eating properly.  He has been  eating three meals a day.  We will see him back in followup.  He does  not want to see a neurologist at this time.     Arturo Morton. Riley Kill, MD, [facility]  Electronically Signed    TDS/MedQ  DD: 11/24/2008  DT: 11/25/2008  Job #: [id number]

## 2011-01-18 NOTE — Assessment & Plan Note (Signed)
[facility]                         GASTROENTEROLOGY OFFICE NOTE   ZIARE, CRYDER                    MRN:          [id number]  DATE:10/24/2007                            DOB:          09-Feb-1930    REFERRING PHYSICIAN:  Corwin Levins, MD   REASON FOR CONSULTATION:  Right lower quadrant pain and diarrhea.   HISTORY OF PRESENT ILLNESS:  Alex Higgins is a 77 year old white male  that I have evaluated in the past.  He has a history of adenocarcinoma  arising in a rectal polyp in 1985.  He last underwent colonoscopy in  March of 2006, which showed diverticulosis and internal hemorrhoids.  In  addition, he has a history of GERD and irritable bowel syndrome.  His  last upper endoscopy was in January of 2005, that showed a mild,  nonerosive gastritis and RUT testing was negative.  He was previously  maintained on Aciphex 20 mg b.i.d. for control of reflux symptoms.  Recently, he has been taking omeprazole 20 mg b.i.d. on a p.r.n. basis.  He relates intermittent problems with crampy suprapubic pain, radiating  to the right lower quadrant, and occasionally to the right upper  quadrant.  This is followed by loose, water, nonbloody stools.  He  states his appetite has changed for approximately the past two to three  months.  He still eats regularly, but he does not experience hunger as  much as he did previously.  He has occasional episodes of nausea.  He  relates no dysphagia, odynophagia, weight-loss, melena, hematochezia or  change in stool caliber.   CURRENT MEDICATIONS:  Listed on the chart, updated and reviewed.   PAST MEDICAL/SURGICAL HISTORY:  Adenocarcinoma arising in a rectal polyp  in 1985  Irritable bowel syndrome  diverticulosis  hemorrhoids  GERD  anxiety  coronary artery disease  status post PTCA  status post appendectomy  status post hernia repair  status post tonsillectomy  hyperlipidemia  history of hepatitis B  benign  prostatic hypertrophy  depression  hypertension  glucose intolerance.   MEDICATION ALLERGIES:  NOVOCAIN and UROXATRAL.   CURRENT MEDICATIONS:  Listed on the chart, updated and reviewed.   SOCIAL HISTORY:  Per the handwritten form.   REVIEW OF SYSTEMS:  Per the handwritten form.   PHYSICAL EXAMINATION:  No acute distress.  Height 6 feet 1 inch, weight 205.6 pounds.  Blood pressure is 130/84,  pulse 60 and regular.  HEENT EXAM:  Anicteric sclerae, oropharynx clear.  CHEST:  Clear to auscultation bilaterally.  CARDIAC:  Regular rate and rhythm without murmurs appreciated.  ABDOMEN:  Soft, nontender, nondistended.  Normoactive bowel sounds.  No  palpable organomegaly, masses or hernias.  NEUROLOGIC:  Alert and oriented times three.  Grossly nonfocal.   ASSESSMENT AND PLAN:  1. GERD.  Change to omeprazole 40 mg p.o. q.a.m. and continue standard      antireflux measures.  2. Presumed irritable bowel syndrome.  He is to avoid any trigger      foods.  Begin Robinul Forte one b.i.d. p.r.n.  He may also use      Imodium b.i.d.  p.r.n. as needed.  If these measures do not      effectively control his symptoms, he is advised to return for      followup in six weeks.  3. Personal history of rectal cancer arising in a rectal polyp.      Recall colonoscopy recommended for March 2011.  4. Diverticulosis.  Long-term high-fiber diet and adequate fluid      intake.     Venita Lick. Russella Dar, MD, [facility]  Electronically Signed    MTS/MedQ  DD: 10/24/2007  DT: 10/24/2007  Job #: [id number]

## 2011-01-18 NOTE — Cardiovascular Report (Signed)
NAME:  Alex Higgins, Alex Higgins NO.:  [id number]   MEDICAL RECORD NO.:  [id number]          PATIENT TYPE:  AMB   LOCATION:  CATH                         FACILITY:  [facility]   PHYSICIAN:  Bevelyn Buckles. Bensimhon, MDDATE OF BIRTH:  10-Jul-1930   DATE OF PROCEDURE:  02/12/2009  DATE OF DISCHARGE:  02/12/2009                            CARDIAC CATHETERIZATION   PATIENT IDENTIFICATION:  [name] is a 77 year old male with a  history of coronary artery disease, status post stent placement to his  distal circumflex in 2001.  He also had a heart catheterization in 2005  which showed a patent circumflex stent and about a 60% lesion in his  LAD.  This was treated medically.  He was admitted with syncopal episode  the other day.  He says has been having chest pain for approximately a  year.  This is relatively constant.  He has not had any significant  change.  He was referred for cardiac catheterization to make sure that  there is not a cardiac etiology to his pain.   PROCEDURES PERFORMED:  1. Selective coronary angiography.  2. Left heart cath.  3. StarClose femoral artery closure.   Of note, a left ventriculogram was not performed due to moderate  elevation in his serum creatinine.   DESCRIPTION OF PROCEDURE:  The risks and indications were explained.  Consent was signed and placed on the chart.  A 5-French arterial sheath  was placed in the right femoral artery using a modified Seldinger  technique.  Standard catheters including a JL-4, JR-4, and angled  pigtail were used for catheterization.  All catheter exchanges were made  over wire.  No apparent complications.   Central aortic pressure was 131/67, mean 94.  LV pressure 134/3 with an  EDP of 15.  There was no aortic stenosis.   Left main was normal.   LAD was a long vessel wrapping the apex, gave off a single diagonal.  There was a long tubular 40-50% lesion in the proximal to mid section of  the LAD spanning the  diagonal.  This was non-flow limiting.   Left circumflex was a large dominant vessel, gave off small ramus  branch, a large OM-1, a large posterolateral, and a PDA.  There was a 20-  30% lesion in the mid AV groove circumflex.  The stent in the distal  left circumflex into the PD was widely patent.   Right coronary is a small nondominant vessel with no high-grade lesions.   ASSESSMENT:  Nonobstructive coronary artery disease with patent left  circumflex stent.  The left anterior descending looked good.   PLAN/DISCUSSION:  Given the results of his coronary angiography, I do  not think his chest pain is cardiac in nature.  We will continue current  regimen.      Bevelyn Buckles. Bensimhon, MD  Electronically Signed     DRB/MEDQ  D:  02/12/2009  T:  02/13/2009  Job:  [id number]

## 2011-01-18 NOTE — Discharge Summary (Signed)
NAME:  Alex Higgins, Alex Higgins NO.:  [id number]   MEDICAL RECORD NO.:  [id number]          PATIENT TYPE:  INP   LOCATION:  1438                         FACILITY:  [facility]   PHYSICIAN:  Georgina Quint. Plotnikov, MDDATE OF BIRTH:  [birth year]   DATE OF ADMISSION:  02/16/2009  DATE OF DISCHARGE:  02/20/2009                               DISCHARGE SUMMARY   DISCHARGE MEDICATIONS:  1. Finasteride 5 mg daily.  2. Aspirin 81 mg daily.  3. Citalopram 20 mg daily.  4. Prilosec 20 mg daily.  5. Fludrocortisone 0.2 mg one in the morning and one at lunch.   DISCHARGE DIET:  No added salt, low-fat, low carb.   ACTIVITY:  Increase activities slowly.   SPECIAL INSTRUCTIONS:  Check temperature twice a day, morning and  evening.  Stop Pravastatin and Zetia.   FOLLOWUP PLANS:  Dr. Jonny Ruiz next week on Wednesday or Thursday.  Skin rash  biopsy with Dr. Jonny Ruiz or myself  next week.   CONSULTATIONS:  Infectious disease, Fransisco Hertz, M.D.   DISCHARGE DIAGNOSES:  1. Recurrent syncope.  The patient has marked orthostatic hypotension,      however, episodes of dizziness were irrelevant to orthostatic      posture changes according to the patient.  Status post extensive      cardiology and neurology workup (negative results).  2. Orthostatic hypotension of unclear etiology.  3. Fever of unknown origin, etiology remains unclear.  4. Rash, fairly new.  5. Anemia with low retic count.  6. Iron deficiency.  7. Hyperglycemia with type 2 diabetes, new.  8. Thrombocytopenia.  9. Elevated D-dimer with negative CT of the chest.  10.Multiple compression fractures of thoracic spine including T4, T6      and T7.  11.Urinary tract infection with staph.  12.Dehydration, mild, corrected.   HISTORY:  1. The patient is a 77 year old male with multiple medical problems      who was readmitted on June 14 due to another syncopal episode.  For      the details please address to history and physical on February 16, 2009.  His blood pressure supine was 123/67, upright 86/38 on      admission.  During the course of hospitalization we dealt with a      number of problems.  Fever of unknown origin.  The patient was      consulted by Dr. Darlina Sicilian.  Extensive blood work was obtained.  So      far negative.  Will try to biopsy his skin lesions next week.  Dr.      Maurice March recommended to discontinue all antibiotics which we did.  He      is to check temperature twice a day at home.  2. Orthostatic hypotension.  He was treated with IV fluids.  We      increased his fludrocortisone to 0.2 mg twice a day.  He did      develop a bit of ankle swelling (trace).  3. Syncope, no recurrence during this hospitalization.  4. Anemia.  Will watch, he  may need to have a bone marrow biopsy as an      outpatient/hematology consult.  5. Type 2 diabetes, new onset.  He will try to manage it with diet,      may need to be addressed as an outpatient.  6. Thrombocytopenia, plan as above.  7. Elevated D-dimer; we did a CAT scan of his chest which came back      negative.  8. Multiple compression fractures on CAT scan of unknown age.  He is      asymptomatic, no pain.  9. Urinary tract infection with staph, antibiotics were discontinued.   On the day of discharge he is feeling fairly well.  He is demanding to  be discharged or threatens to walk out, he is going crazy here.  He is  not suicidal or homicidal.  He does not appear depressed.  Temperature  99.0, T-max 99.1.  CBG is 131-132-137, supine blood pressure 122/66,  upright 107/56, heart rate 58-69, respirations 16-18.  Hemoccult stool  negative.  He is in no acute distress.  Lower extremities have trace  edema.  HEENT moist.  Alert, oriented and cooperative.  Lungs clear with  decreased breath sounds at bases.  No wheezes.  Heart S1, S2, grade 2/6  systolic murmur.  No gallop.  Abdomen soft, nontender, no organomegaly  is felt.  Skin examination reveals 3-6 mm  erythematous papules over  extremities and trunk.  He he is alert, oriented and cooperative.   CT of the chest/angiogram showed no evidence of pulmonary emboli,  compression fractures T4, T6 and T7.  T6 might be subacute.  Hemoglobin  was 10.7 with platelet count 112, white count 4.0, sodium 134, potassium  3.3 on June 16.  TSH 0.553, LDH 191, PTH elevated at 92.0, calcium 7.3,  haptoglobin 56 normal.  DAT IgG negative, DAT complement negative.  Fecal occult blood negative.  Multiple other tests including EBV,  toxoplasma, viral  hepatitis, sed rate, CRP, ANA, ANCA, Bartonella ordered and pending.  The patient refused HIV test.  Ehrlichia, Lyme disease and [facility] Spotted Fever tests are pending.   Previously he did have elevated CRP with normal sed rate.      Georgina Quint. Plotnikov, MD  Electronically Signed     AVP/MEDQ  D:  02/20/2009  T:  02/20/2009  Job:  [id number]   cc:   Luis Abed, MD, [facility]  [address]  [address]  [place]  [place] [address]   Corwin Levins, MD  [address]  [place]  [place] [address]   Venita Lick. Russella Dar, MD, FACG  [address]  [place]  [place] [address]

## 2011-01-18 NOTE — Consult Note (Signed)
NAME:  ELIH, MOONEY NO.:  [id number]   MEDICAL RECORD NO.:  [id number]          PATIENT TYPE:  INP   LOCATION:  1414                         FACILITY:  [facility]   PHYSICIAN:  Deanna Artis. Hickling, M.D.DATE OF BIRTH:  04-23-1930   DATE OF CONSULTATION:  02/13/2009  DATE OF DISCHARGE:                                 CONSULTATION   CHIEF COMPLAINT:  Syncope.   HISTORY OF THE PRESENT CONDITION:  I was asked to see Alex Higgins, a  77 year old very pleasant gentleman, who was admitted through the  services of Dr. Christella Noa for evaluation of syncope.   The patient has been evaluated at [facility] this week for  cerebrovascular and cardiovascular etiologies.  We are asked to add our  impression to the information that has been garnered this week.   The patient has had 2 syncopal episodes on successive days that led to  his admission.  The first occurred on Sunday morning.  He had been out  working in the yard all day long on Saturday, had not had much to drink,  went to bed and slept.  When he got up and took a shower the next  morning, he collapsed in the shower stall.  He was unresponsive and came  to over about 10 minutes.  After he awakened, he appeared to be normal.  There was no significant postictal period.  There was no seizure-like  activity when he was found by his wife.   The patient had very poor color that was somewhat grayish.  This  continued throughout the day.   The next day, the patient was unsteady on his feet, needed to hold on as  he was walking.  His wife thought that his left face was drooping, and  he was drooling out of the left corner of his mouth.  He suddenly became  unresponsive to her voice while he was upright, and then collapsed.   EMS was called and was brought to the emergency room.  The patient did  not show significant hypotension or hypoglycemia as etiology.  He again  showed no signs of seizure activity.   He tells me that he had many complaints over the past few weeks.  He has  been worried about his memory and forgetting things.  He felt weak.  There were times that he has had brief episodes of staring, usually when  he is upright and walking around.  The patient has dyslipidemia.  He has  undiagnosed diabetes (hemoglobin A1c 7.2).  He has had adenocarcinoma,  benign prostatic hypertrophy, prior GI bleed, sinus bradycardia.  In the  hospital, he has been noted to have very significant and symptomatic  orthostatic hypotension.  He has a history of sleep apnea but refuses to  wear his CPAP machine.  He also has depression and gastroesophageal  reflux disease.  In the hospital, he has also been noted to have  thrombocytopenia with some bruising which is of unknown etiology.  He  also tells me that he has daily dull headaches that come and go.  He has  had  a longstanding history of headaches, and I think this represents  tension-type headaches.  He does not take medication for it.  It goes  away on its own.  He does not have any other associated symptoms.   His review of systems is negative except as noted above in his rather  complicated history of present illness and past medical history.   His current medications include:  1. Aspirin.  2. Proscar.  3. Celexa.  4. Prevacid.  5. Zetia.  6. Prilosec.  7. Darvocet.   In the hospital, he is on:  1. Aspirin 81 mg.  2. Rocephin 1 g every 24 hours (for UTI).  3. Celexa 20 mg daily.  4. Lovenox 20 mg subcutaneously.  5. Zetia 10 mg daily.  6. Flagyl 250 mg every 6 hours.  7. Protonix 40 mg every 12 hours.  8. Zocor 40 mg every 18 hours.  9. Sliding scale insulin.   ALLERGIES:  OXYTROL and NOVOCAIN.   FAMILY HISTORY:  Noncontributory.   SOCIAL HISTORY:  The patient smoked and quit in the 1960s.  He does not  use alcohol.  He lives in [place] with his spouse.  He is a retired  Art gallery manager.   PHYSICAL EXAMINATION:  This is a  well-developed gentleman, very  pleasant, engaging in no acute distress.  Blood pressure 125/81, resting  pulse 78, respirations 22, temperature 98.9.  He is right-handed.  GENERAL PHYSICAL EXAMINATION:  No signs of infection.  He had definite bruits in his carotids that were rather soft.  He also  had bruits in both vertebral arteries, too.  He had supple neck, slight  decreased range of motion related to arthritis.  LUNGS:  Clear.  HEART:  No murmurs.  Pulses normal.  ABDOMEN:  Soft, nontender.  Bowel sounds normal, slightly protuberant.  EXTREMITIES:  No edema or cyanosis.  NEUROLOGIC EXAMINATION:  Mental status:  The patient was awake, alert,  attentive, and appropriate.  His mini-mental status examination was 26.  His clock drawing was 3.  He put all the numbers outside the clock.  Cranial nerves:  Round reactive pupils.  Visual fields full to double  simultaneous stimuli.  Extraocular movements full and conjugate.  Symmetric facial strength and sensation.  Air conduction greater than  bone conduction bilaterally.  Motor examination:  The patient had  excellent strength in his arms and his legs.  Good fine motor movements.  No pronator drift.  Sensation showed a stocking neuropathy to cold  vibration and to a lesser extent proprioception to the shin.  He had  fairly normal sensation in his upper extremities.  He had good  stereoagnosis in his upper extremities.  His gait was not tested, but he  shows lower extremity weakness, but he does not show significant  dystaxia or shuffling behavior.  Coordination was adequate for finger-to-  nose and heel-knee-shin.  Rapid movements were acceptable.  He has a  very slight postural tremor.   Reflexes were symmetric and normal in the upper extremities, diminished  to absent at the knees and ankles.  He had bilateral flexor plantar  responses.   IMPRESSION:  1. I believe this gentleman has significant orthostatic hypotension,      458.0,  that is symptomatic and caused a syncopal episode.  I do not      think that he had a seizure.  I think that he has had very adequate      workup looking for strokes and looking for symptomatic  vascular      disease.  The area I am not certain about is vertebrals where I can      hear bruits, but he seems to have antegrade flow.  I have reviewed      his MRI scan, and it shows significant brain atrophy.  The patient      also has some abnormalities in his mini-mental status examination      and his clock drawing.  2. This patient has mild cognitive dysfunction versus an early      dementia.  We need to think about placing him on Aricept, and if      not this time, he needs to be watched very closely, because if he      changes anymore, he needs to be placed on medication.  I do not      think this is the reason for his syncope, however.   The patient has had a cardiac catheterization that basically rules out  cardiac ischemia.  He has no strokes and no signs of prior stroke to his  brain stem.  He says he feels much better since he has been in the  hospital and is not having cramps in his legs which he had almost every  night prior to admission, and he also is not having significant  lightheadedness at this time.   Orthostatic blood pressures in the hospital have shown recumbent blood  pressure 125/81, sitting 112/62, standing 72/39 with pulses rising from  78 to 83-94.   RECOMMENDATIONS:  1. I agree with Dr. Debby Bud' plan to place him on Florinef and hydrate      him.  Laboratory studies are at this point unrevealing of etiology      for some form of systemic collagen vascular disease.  I strongly      suspect CNS hypoperfusion is the reason for his syncopal episodes.  2. Consider the use of Aricept.  3. He does not have significant hypokalemia.  I am not certain why he      has cramps in his legs.  That may bear further watching.  He has      tension-type headaches.  He also has  significant peripheral      polyneuropathy and does not feel his feet which can be another      reason why he might fall.   I appreciate the opportunity to see this very nice gentleman.  You have  questions about this or if I can be of assistance, do not hesitate to  contact me.      Deanna Artis. Sharene Skeans, M.D.  Electronically Signed     WHH/MEDQ  D:  02/13/2009  T:  02/13/2009  Job:  [id number]

## 2011-01-18 NOTE — Discharge Summary (Signed)
NAME:  Alex Higgins, Alex Higgins NO.:  [id number]   MEDICAL RECORD NO.:  [id number]          PATIENT TYPE:  INP   LOCATION:  1414                         FACILITY:  [facility]   PHYSICIAN:  Rosalyn Gess. Norins, MD  DATE OF BIRTH:  09-30-1929   DATE OF ADMISSION:  02/10/2009  DATE OF DISCHARGE:  02/15/2009                               DISCHARGE SUMMARY   ADMISSION DIAGNOSES:  1. Syncope.  2. Acute renal insufficiency.  3. Hyperkalemia.  4. Diabetes.  5. Thrombocytopenia.  6. Leukocytosis.  7. History of coronary artery disease.  8. Hyperlipidemia.   DISCHARGE DIAGNOSIS:  1. Orthostatic hypotension.  2. Renal insufficiency, resolved.  3. Hyperkaliemia, resolved.  4. Diabetes, stable.  5. Thrombocytopenia, stable.  6. Leukocytosis, resolved.  7. Coronary artery disease, stable.  8. Hyperlipidemia, stable.  9. Urinary tract infection, with resistant organisms, stable on      antibiotic therapy.   CONSULTANTS:  1. Dr. Hillis Range for electrophysiology/cardiology.  2. Dr. Ellison Carwin for neurology.   PROCEDURES:  1. Chest x-ray on the date of admission, with no active lung disease.  2. CT of the brain without contrast on day of admission, with atrophy      and small-vessel disease, with no acute intracranial findings.  3. Chest x-ray on June 9,2010, with COPD with old granulomatous      disease of the right base.  4. CT the abdomen and pelvis with contrast, which showed no acute      abdominal process.  Stable renal hyper-density.  No acute      intrapelvic process.  5. Chest X-Ray on February 12, 2009, which showed no acute cardiopulmonary      disease.  6. CT of the brain without contrast on June 10,2010, which showed mild      age related atrophy.  7. MRI of the brain with and without contrast on June 11,2010, which      showed atrophy and chronic ischemic change, with no acute      abnormality.  8. Cardiac catheterization performed on February 12, 2009, which  showed no      obstructive coronary artery disease.  The circumflex stent was      patent.   HISTORY OF PRESENT ILLNESS:  The patient is a 77 year old Caucasian  gentleman, followed by Dr. Efrain Sella for primary care and by Dr. Luis Abed for cardiology, who presented to [facility]  Department, after having two syncopal episodes.  The patient reported  one event occurred while in the shower on the day prior to admission.  The second episode occurred on the morning of admission, when walking at  home.  The patient denied any preceding dizziness, chest pain, shortness  of breath, palpitations bowel or bladder incontinence with this episode.  The patient no post-ictal episodes.  The patient had significant gait  instability on the two days prior to admission.  The patient's only  other primary complaint was leg cramping.   PAST MEDICAL HISTORY:  1. Significant for coronary artery disease, status post stenting of      the  circumflex in 2001.  2. Hypertension.  3. Hyperlipidemia.  4. Hepatitis-B.  5. Anxiety and depression.  6. BPH.  7. Gastroesophageal reflux disease.  8. Mild glucose intolerance.  9. History of syncope in the past, thought to be secondary to      pneumonia 2006.  10.History of abnormal LFTs, related to hepatitis-B.   PAST SURGICAL HISTORY:  1. The patient had a history of laparoscopic cholecystectomy in May      2009.  2. The patient has a history of a GI bleed.   MEDICATIONS ON ADMISSION:  1. Include finasteride 5 mg daily.  2. Aspirin 81 mg daily.  3. Celexa 20 mg daily.  4. Pravachol 80 mg daily.  5. Zetia 10 mg daily.  6. Prilosec 20 mg q.a.m.  7. Darvocet for pain.   PHYSICAL EXAMINATION:  Please see the H&P for admission physical  examination.   HOSPITAL COURSE:  1. -  Syncope:  The patient with syncope, with concern for cardiac-      related syncope.  He was seen by electrophysiology in consultation,      who did not think  that this was an arrhythmia.  The patient was      monitored on telemetry, which revealed no significant arrhythmias.      The patient was also seen by his regular cardiology service, with a      concern for possible recurrent CAD.  The patient came to cardiac      catheterization, which was normal, as noted.   Attention was turned the patient having potential neurologic deficits.  The head CT and MRI of the brain, as noted above.  The patient was seen  in consultation by Dr. Sharene Skeans.  EEG was ordered and performed, which  showed no seizure activity of seizure focus.  Of note, the patient was  noted to have very marked orthostatic hypotension with a 50-point drop  in systolic blood pressure, from being supine to standing.  It was the  working diagnosis that his syncopal episodes were orthostatic in nature.  The patient was started on Florinef, and on this regimen he had marked  improvement in his orthostasis, as well as decreased sensation of near  syncope.  The patient's last two orthostatic vital sign evaluations from  June 12th at 1815 hours, revealed a supine blood pressure of 117/67,  with a standing blood pressure of 102/78, and on the morning of  discharge, blood pressure was 115/58 supine and 105/59 standing.  This  was markedly improved from June 11th, where he had a blood pressure of  125/81 supine, and 74/37 standing.  With the patient having had a  thorough evaluation for the cause of his syncope, with no cardiovascular  or other neurologic etiology, with clear-cut orthostatic hypotension,  now improved on Florinef, he is thought to be stable and ready for  discharge home.  1. Febrile illness: -  The patient had recurrent fevers.  He had been      treated with antibiotic therapy on Rocephin and Flagyl.  Blood      cultures were negative x2.  The patient did have a urine culture      which grew out Staphylococcus species of a coag-negative, that was      resistant to  levofloxacin, oxacillin, penicillin, and sensitive to      nitrofurantoin.  With this information, the patient was taken off      of Rocephin and Flagyl and started on Macrodantin.  Over the past      24 hours has had no fevers, and this problem is considered stable.      The patient's other medical problems did remain stable.   DISCHARGE PHYSICAL EXAMINATION:  VITAL SIGNS:  Temperature of 98.6  degrees, blood pressure 104/56, with orthostatic vital signs as above,  heart rate 76, respirations 20.  GENERAL APPEARANCE:  A pleasant elderly gentleman, in no acute distress.  HEENT: Exam was unremarkable.  CHEST:  Patient is moving air well.  There are no rales, no wheezes, no  rhonchi, no decreased breath sounds or other signs of fluid overload.  CARDIOVASCULAR:  With 2+ radial pulses.  Precordium was quiet.  He had a  regular rate and rhythm.  ABDOMEN:  Was soft.  NEUROLOGIC:  The patient is awake, alert, oriented to person, place,  time and context.  Cranial nerves were grossly intact.  Patient is  moving all extremities.  No further examination conducted.   FINAL LABORATORY DATA:  C-reactive protein from June 11th was 33.1.  Urine culture, as noted.  ESR from June 11th was9.  Basic metabolic  panel from June 11th, with a sodium 132, potassium 5.1, chloride 98, CO2  of 31, BUN 16, creatinine 1.4, glucose was 235.  CBC from June 11th,  with a white count of 5800, hemoglobin was 13.7 grams, platelet count  75,000.  His free T4 from June 9th, was normal at 1.21.  The patient had  cardiac panels that were negative x4.  Lipid panel from June 9th, with  his cholesterol of 127, triglycerides 151, HDL of 26, LDL was 71.  TSH  from June 8th was 0.33.  Hemoglobin A1c from June 8th was 7.2%.   DISCHARGE MEDICATIONS:  1. The patient will continue his home medications including      finasteride 5 mg daily.  2. Aspirin 81 mg daily.  3. Celexa 20 mg daily.  4. Pravachol 80 mg daily.  5. Zetia 10  mg daily.  6. Prilosec 20 mg q.a.m.  7. Darvocet-N-100, one tablet q.6h. p.r.n. pain.   NEW MEDICATIONS:  1. Include Florinef 0.1 mg p.o. b.i.d.  2. Macrobid 100 mg b.i.d. for an additional five days.   DISPOSITION:  The patient is discharged to home.   DISCHARGE INSTRUCTIONS:  He is carefully instructed that if he has been  evidence of shortness of breath, increased work of breathing or  peripheral edema, suggestive of fluid overload, that he needs to call  the office for immediate attention.   FOLLOWUP:  1. The patient will need to call the office to schedule a follow-up      appointment with Dr. Efrain Sella in seven to 10 days.  2. To see cardiology as instructed.   CONDITION ON DISCHARGE:  The patient's condition at time of discharge  dictation is improved and medically stable.      Rosalyn Gess Norins, MD  Electronically Signed     MEN/MEDQ  D:  02/15/2009  T:  02/15/2009  Job:  [id number]   cc:   Deanna Artis. Sharene Skeans, M.D.  Fax: [phone]   Corwin Levins, MD  [address]  [place]  [place] [address]

## 2011-01-18 NOTE — Procedures (Signed)
EEG NUMBER:  [id number].   CLINICAL HISTORY:  The patient is a [age]-year-old man who had an episode  of syncope on February 10, 2009, and February 11, 2009.  He has new onset of  diabetes mellitus, known coronary artery disease, severe orthostatic  hypotension.  He had an episode of confusion, gait difficulty, left-  sided facial weakness.  Study is being done to look for the presence of  seizures. (780.02)   PROCEDURE:  The tracing is carried out on a 32-channel digital Cadwell  recorder reformatted into 16-channel montages with 1 devoted to EKG.  The patient was awake during the recording.  The international 10/20  system lead placement was used.   DESCRIPTION OF FINDINGS:  Dominant frequency was 7 Hz 20 microvolt  activity that is well regulated and attenuates partially with eye  opening.   Background activity consists of mixed frequency predominant theta and  frontally predominant beta range activity.  The patient does not change  the state of arousal during the record.   EKG showed regular sinus rhythm with ventricular response of 78 beats  per minute.   IMPRESSION:  Abnormal EEG on the basis of mild diffuse background  slowing.  This is a nonspecific indicator of neuronal dysfunction that  maybe on a primary degenerative basis or secondary to a variety of toxic  or metabolic etiologies.   CURRENT MEDICATIONS:  NovoLog, Proscar, aspirin, Celexa, Zocor, Zetia,  Protonix, Lovenox, Zofran, Darvocet, Rocephin, Tylenol and Flagyl.      Deanna Artis. Sharene Skeans, M.D.  Electronically Signed     ZOX:WRUE  D:  02/13/2009 19:07:03  T:  02/14/2009 05:01:43  Job #:  [id number]

## 2011-01-18 NOTE — Assessment & Plan Note (Signed)
[facility]                         GASTROENTEROLOGY OFFICE NOTE   KROSBY, RITCHIE                    MRN:          [id number]  DATE:11/21/2007                            DOB:          04/25/1930    Alex Higgins returns with right-sided abdominal pain and elevated liver  function tests.  He previously noted right lower quadrant pain with  diarrhea.  His diarrhea has completely resolved, and now he relates that  the abdominal pain is now right-sided, epigastric and periumbilical.  It  is present constantly.  It does not wax or wane.  Liver function tests  from November 06, 2007 show an AST of 284 and an ALT of 203, t. bilirubin of  2.0, direct of 0.9 and indirect of 1.1.  Repeat liver function tests on  November 16, 2007 show an ALT of 153, an AST of 112, total bilirubin of  1.3, and a direct bilirubin of 0.4.  An acute hepatitis panel was  negative.  A CT scan of the abdomen and pelvis on November 09, 2007 showed  diffuse progressive, dense calcification of the gallbladder with no  intra- or extra-hepatic ductal dilatation.  There is a calcified  granuloma in the right lower lobe and a mildly enlarged prostate.  A  left renal cyst was also noted.  He has had no recent medication changes  except for the addition of finasteride approximately 4-5 months ago.  He  temporarily discontinued aspirin, but this was recently restarted.  This  did not impact his symptoms.   CURRENT MEDICATIONS:  Listed on the chart, updated, and reviewed.   MEDICATION ALLERGIES:  Listed on the chart, updated, and reviewed.   PHYSICAL EXAMINATION:  No acute distress.  Weight stable at 203.4 pounds.  Blood pressure is 100/62.  Pulse 68 and  regular.  HEENT:  Anicteric sclerae.  Oropharynx clear.  CHEST:  Clear to auscultation bilaterally.  CARDIAC:  Regular rate and rhythm without murmurs appreciated.  ABDOMEN:  Soft and nondistended.  He has generalized right-sided  abdominal  tenderness with epigastric tenderness and periumbilical  tenderness.  No palpable organomegaly, masses, or hernias.  Normoactive  bowel sounds.   ASSESSMENT/PLAN:  Elevated liver function tests and progressive dense  calcifications of the gallbladder.  Rule out porcelain gallbladder.  Rule out cholelithiasis, cholecystitis, and gallbladder carcinoma.  Obtain standard serological markers for further evaluation of his  abnormal liver function tests.  Schedule abdominal ultrasound.  Return  office visit in one week.  Cholecystectomy and a liver biopsy are the  likely next steps.     Venita Lick. Russella Dar, MD, [facility]  Electronically Signed    MTS/MedQ  DD: 11/21/2007  DT: 11/21/2007  Job #: [id number]   cc:   Corwin Levins, MD

## 2011-01-18 NOTE — Assessment & Plan Note (Signed)
[facility]                            CARDIOLOGY OFFICE NOTE   Alex Higgins, Alex Higgins                    MRN:          [id number]  DATE:03/27/2008                            DOB:          October 12, 1929    HISTORY OF PRESENT ILLNESS:  Alex Higgins is in for followup.  He is much  better.  His indigestion has resolved.  His dizziness and palpitations  are medically better.  He does notice some discomfort when he walks in  the cast.  He wonders about peripheral arterial disease.   PHYSICAL EXAMINATION:  Today, blood pressure 110/74, pulse is 60.  Lung  fields are clear.  There are no femoral bruits.  There are decreased  pulses in the feet on the right, but reasonably good capillary refill.  The pulses on the left are excellent.  The extremities reveal really  only trace edema.  There is a soft left carotid bruit at the bottom of  the neck.   IMPRESSION:  Overall, the patient is improved and stable from a cardiac  standpoint.   PLAN:  We plan to see him back in followup in 6 months.  He does need  followup transaminase levels as well as lipid profile.   ADDENDUM   EKG reveals normal sinus rhythm with low voltage on March 27, 2008.     Arturo Morton. Riley Kill, MD, [facility]  Electronically Signed    TDS/MedQ  DD: 03/27/2008  DT: 03/28/2008  Job #: [id number]

## 2011-01-18 NOTE — Consult Note (Signed)
NAME:  KIT, MOLLETT NO.:  [id number]   MEDICAL RECORD NO.:  [id number]          PATIENT TYPE:  INP   LOCATION:  1414                         FACILITY:  [facility]   PHYSICIAN:  Hillis Range, MD       DATE OF BIRTH:  Jan 04, 1930   DATE OF CONSULTATION:  02/11/2009  DATE OF DISCHARGE:                                 CONSULTATION   PRIMARY CARDIOLOGIST:  Arturo Morton. Riley Kill, MD, [facility]   We were asked to consult on Mr. Disney for evaluation of syncope  episode x2.  Mr. Deem is a 77 year old Caucasian gentleman followed  by Dr. Bonnee Quin who has a known history of coronary artery disease,  status post PCI in 2001 with placement of a stent to the distal  circumflex.  Repeat catheterization in 2005, at which time the patient  was found to have an EF of 77% with continued patency of previously  placed stent to the posterior circumflex and progression of moderate  disease in the mid distal portion of the left anterior descending artery  with moderately high-grade focal narrowing enhanced by intracoronary  nitroglycerin distal to the area of modest segment plaquing in the LAD.  At that time, the patient preferred a trial of medical therapy.  Mr.  Vivona states he has been doing quite well, has had no episodes of  chest discomfort or limitations in activity.  However, in talking with  his family.  They describe 2 weeks of change in status.  They feel the  patient's color has been poor, almost a grayish color to his skin.  He  has also been noted to be slightly unsteady at times and is walking.  Family also states his eyes have not looked exactly appropriate.  They  feel like his pupils have been dilated.  The patient does not recall any  of these events over the last 2 weeks as being an issue.  However, on  Monday morning he got up from bed, went to the bathroom to take a  shower.  His wife was in the kitchen and heard a thump.  She found him  partially out of the  shower with the shower running.  He was not  responding initially.  He slowly came around.  She states it took about  10 minutes for him to become alert, and he kept asking what happened.  She states his color was gray.  He did not appear to have any seizure  activity at that time.  He finally was able to get up off the bathroom  floor by himself.  He felt poorly the rest of the day.  Prior to the  syncopal episode. the only thing he recalls is having a headache and  some heaviness in his chest.  He denied any palpitations or shortness of  breath.  Wife states he continued to look grayish the rest of the day.  He felt poorly, but he went about their errands they already had  planned.  In the afternoon, he had some chills and felt nauseated.  He  went to bed early.  Tuesday  morning, he got up out of the bed.  His wife  was in the kitchen.  The patient does not recall anything after getting  out of the bed.  His wife states he was coming down the hall into the  kitchen.  He was staggering, appeared to be holding onto the wall or  reaching for the wall to balance himself.  The patient states he recalls  having a headache when he first got up and having the chest heaviness  again.  The wife states she noticed that his face seemed to be drooping  to the left some and had some foaming around his mouth.  He then went  unresponsive again.  Wife states he did not arouse very quickly.  She  slapped him a couple times but he did not respond.  She left him to call  9-1-1.  In the interim, the patient apparently had a vomiting episode.   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  The patient had blood  pressure of 150/92 and a heart rate of 72 with respirations of 20.  Their note states his pupils were equal and reactive at that time.  A  CABG was checked.  Documentation states 183, and he had breath sounds  that were clear and equal.  He was placed on a 15% non-rebreather.  I do  not have the  EMS notes available.  I do not know what his heart rhythm  was at that time.  The patient was transported to [facility].  The  patient states the only thing he recalls is when he came around the  paramedics were working on him and starting an IV.   Since being admitted, the patient has had a CT of the head that showed  no acute findings.  He has had bilateral carotid Dopplers.  Right showed  60-80% ICA, left showed 40-60% ICA, both within the normal limits.  In  the ER, he was found to be hyperglycemic with a glucose of 264 and  hyperkalemic with a potassium of 5.4.  His blood pressure was 115/63.  The patient was treated with Kayexalate, insulin, calcium gluconate and  sodium bicarbonate and admitted to the floor under the hospitalist  service.   It is not clear at this time if the patient has had a neurological  workup.  I do not see any documentation in the chart to support that.  The patient states he is currently having chest discomfort.  He  describes a substernal heaviness, rating it a 1-2 on a scale of 1-10;  however, he is also reporting some chest discomfort with inspiration.  Orthostatic vital signs obtained.  The patient did not tilt on February 10, 2009.  He has had a T-max of 101.8 on this date.  The patient had a 2-D  echocardiogram back in February that showed EF of 55-60% with mild  diastolic dysfunction, a PA systolic pressure mildly elevated at 34/   PAST MEDICAL HISTORY:  1. Coronary artery disease status post cath with a stent to the distal      circumflex in 2001 and most recent catheterization in 2005.      Results as stated above.  2. Hypercholesteremia.  3. History of sinus brady.  4. History of orthostatic hypotension in 2006 in the setting of      sinusitis and hiccups.  5. History of hepatitis B in the setting of cholelithiasis, status      post laparoscopic cholecystectomy.  6. Depression.  7. GERD.  8. Remote tobacco use.  9. History of  adenocarcinoma.  10.BPH.  11.History of GI bleed.  12.Hyperglycemia with no treatment.   SOCIAL HISTORY:  The patient lives in [place] with his spouse.  He  is a retired Art gallery manager.  He quit smoking in the 1960s.  Denies any EtOH,  drug or  herbal substance use.  Tries to follow a heart healthy diet.   FAMILY HISTORY:  Positive for CHF in his brother.   REVIEW OF SYSTEMS:  Positive for chills, headaches, chest pain, syncope,  generalized weakness, nausea, vomiting, abdominal tenderness, decreased  appetite.  All other systems reviewed and negative.   ALLERGIES:  1. UROXATRAL.  2. NOVOCAIN.   MEDICATIONS AT HOME:  1. Proscar.  2. Aspirin.  3. Celexa.  4. Pravachol.  5. Zetia.  6. Prilosec.  7. Darvocet.   MEDICATIONS IN HOSPITAL:  Here, the patient is on:  1. Insulin.  2. Proscar.  3. Aspirin 81.  4. Celexa.  5. Zocor.  6. Zetia.  7. Protonix.  8. Lovenox 40.  9. Rocephin.  10.Flagyl.  11.Normal saline at 125.   PHYSICAL EXAMINATION:  VITAL SIGNS:  Temperature is currently 98.5,  heart rate 68, respirations 18, blood pressure 120/72.  GENERAL:  The patient is currently in no acute distress.  Although he is  having chest discomfort, he is rating it a 2 on a scale of 1-10.  He  describes it as a substernal heaviness without acute EKG changes.  HEENT:  Unremarkable.  NECK:  Supple without lymphadenopathy or bruits.  No JVD.  LUNGS:  Clear to auscultation bilaterally.  CARDIOVASCULAR:  An S1-S2, somewhat distant heart sounds without  murmurs, rubs or gallops.  SKIN:  Warm and dry.  ABDOMEN:  Soft, positive bowel sounds.  He has some tenderness in the  lower quadrant.  NEUROLOGICALLY:  He is alert and oriented x3.  LOWER EXTREMITIES:  Without clubbing, cyanosis or edema.  He has  positive pedal pulses bilaterally.   CHEST X-RAY:  COPD with old granulomatous disease of the right lung  base.  No acute abnormalities.   CT OF THE HEAD:  Atrophy and small-vessel  disease.  No acute findings.  No change from 2009.   ELECTROCARDIOGRAM:  A 12-lead EKG showing sinus rhythm at a rate of 61.   LABORATORY DATA:  Cardiac enzymes negative x3.  Total cholesterol 127,  triglycerides 151, HDL 26, LDL 71.  Sodium 133, potassium 4.5, chloride  100, CO2 of 27, glucose 165, BUN 21, creatinine 1.61.  CBC - WBCs are  9.9, hemoglobin 16.1, hematocrit 46, platelet count 94,000.  Urinalysis  negative.  Blood cultures are pending.  TSH 0.333.  Hemoglobin A1c is  7.2.   IMPRESSION:  Patient with multiple issues including a syncopal episode  x2 associated with chest heaviness and headaches, questionable  neurologic involvement also.  The patient will need a formal  neurological consult if not already done.  The patient also with fever  of unclear etiology and hyperglycemia, low TSH.  From a cardiac  perspective, the patient will need to remain on telemetry with fall  precautions.  Will need a cardiac catheterization  for further evaluation.  Will repeat his 2-D echocardiogram, repeat  orthostatics, gently hydrate.  Further workup pending results of cardiac  catheterization.  Also note, the patient has had a carotid massage  performed by Dr. Hillis Range that was negative.  Dr. Johney Frame has been in  to  examine and assess the patient and agrees with plan of care.      Dorian Pod, ACNP      Hillis Range, MD  Electronically Signed    MB/MEDQ  D:  02/11/2009  T:  02/11/2009  Job:  [id number]

## 2011-01-18 NOTE — Assessment & Plan Note (Signed)
[facility]                            CARDIOLOGY OFFICE NOTE   SKYLER, Alex Higgins                    MRN:          [id number]  DATE:06/11/2007                            DOB:          05-20-1930    Curran is in for followup.   In general, he is stable.  He has not been having any chest pain.  He  says that occasionally he will get some chest pain but he goes out and  exercises for 2 days in a row and it all goes away.  He does not  describe this as a pressure but more as a sharp type of pain.   MEDICATIONS:  1. Celexa 20 mg daily.  2. Aspirin 81 mg daily.  3. Zetia 10 mg daily.  4. Prilosec 20 mg daily.  5. Pravachol 80 mg daily.   PHYSICAL EXAMINATION:  VITAL SIGNS:  The blood pressure is 110/60.  The  pulse is 66.  On repeat blood pressure checks, it was 126/60 supine; on  standing it fell to about 112, which is about a 14 point drop.  It did  not reproduce symptoms.  LUNGS:  Fields are clear.  CARDIAC:  Rhythm is regular.   IMPRESSION:  1. Coronary artery disease with hypercholesterolemia.  2. Mild orthostatic hypotension.   PLAN:  1. Lipid/liver profile.  2. Remind him to stay well hydrated and not get up so quickly as this      is the only time it produces symptoms.  3. Return to clinic in 6 months.   ADDENDUM:  EKG today reveals normal sinus rhythm with low voltage QRS.     Arturo Morton. Riley Kill, MD, [facility]  Electronically Signed    TDS/MedQ  DD: 06/11/2007  DT: 06/11/2007  Job #: [id number]

## 2011-01-18 NOTE — Op Note (Signed)
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   POSTOPERATIVE DIAGNOSIS:  Symptomatic cholelithiasis with a history of  hepatitis.   SURGEON:  Lennie Muckle, MD   ASSISTANT:  Leonie Man, M.D.   PROCEDURE:  1. Laparoscopic cholecystectomy with liver biopsy.  2. Intraoperative cholangiogram.  3. Diagnostic laparoscopy of the abdominal wall.   INDICATIONS FOR PROCEDURE:  Mr. Mikes is a 77 year old male who had  right upper quadrant pain and as well a epigastric discomfort.  A workup  included an ultrasound and a CT scan with a question of either a large  gall stone or Porcelain gall bladder.  He has also had an elevation of  his liver enzymes and did have a history of hepatitis.  His repeat labs  revealed normal AST and ALT.  Informed consent was obtained for  laparoscopic cholecystectomy and intraoperative cholangiogram liver  biopsy.  The patient had complaints of right lower abdominal wall  bulging, therefore, I also did a diagnostic laparoscopy examination of  the abdominal wall.  Informed consent was obtained prior to the  procedure.   DESCRIPTION OF PROCEDURE:  Mr.  Mareno was identified at the pre-  operating holding suite.  He was taken to the operating room, placed in  the supine position.  He did receive IV cefoxitin prior to the  procedure, once in the operating room, placed in supine position.  After  administration of general endotracheal anesthesia, his abdomen was  prepped and draped in usual sterile fashion.  A time out  procedure,  indicating patient and  procedure were performed.  An umbilical incision  was placed with #11 blade.  Veress needle placed in abdominal cavity and  pneumoperitoneum .  After adequate pneumoinsufflation, I placed a 12-mm  trocar using Optiview at the umbilical incision.  There was no evidence  of injury upon placement of the Veress needle and the trocar.  Additional 5-mm trocars were placed, one to the epigastric region, two  along the right costochondral margin under visualization with the  camera.  The gallbladder was visualized in its normal anatomic position,  appeared normal, did not have a thickened wall appearance.  The liver  was somewhat nodular.  Using the grasper, the fundus of the gallbladder  was retracted to the head of the patient .  The gallbladder was soft on  manipulation.  The infundibulum was grasped away from the liver bed.  The peritoneum surrounding the gallbladder was dissected with the  [place] forceps.  I isolated the cystic duct, cystic artery to obtain a  critical view of these structures and the liver bed.  A clip was placed  proximally on the cystic duct.  I  clipped and ligated the cystic artery.  Using the laparoscopic scissors, I made an enterotomy into the cystic  duct.  Cholangiocatheter placed, and the cholangiogram was performed  without filling defects in the right and left hepatic ducts, bile duct  did fill into the duodenum.  There was some slight spasm noted in the  distal duct, but no evidence of a stone.  The cholangiogram catheter was  removed.  Three clips were placed proximally on the cystic duct and it  was transected.  The peritoneal attachments of the gallbladder were  taken with the electrocautery.  The gallbladder was placed in EndoCatch  bag and removed from the umbilical incision.  There was some minimal  amount of oozing at the liver bed which was easily corrected with  electrocautery.  Using the Tru-Cut, a liver biopsy was attempted.  Several passes were made  until liver only obtained as final specimen for  the liver biopsy.  I therefore, used laparoscopic scissors and performed  an excisional biopsy at the edge of the gallbladder.  This was sent for  permanent pathology.  Then the gallbladder fossa was irrigated.  There  was no evidence of bleeding in the gallbladder fossa or the liver.  I  did coagulate the liver capsule and the area with the Tru-cut liver  biopsies.   I then turned my attention to the abdominal wall.  There was an adhesion  noted from the previous incisional scar in the right lower quadrant.  Using the sharp dissection, I removed the omental adhesion.  The  abdominal wall was examined.  There was no evidence of a hernia.  The  omentum had mild oozing which was corrected with electrocautery.  Upon  final inspection of the abdominal cavity there was no bleeding noted, no  injury to abdominal organs.  I used a 0-Vicryl sutures to close the  umbilical defect.  Pneumoperitoneum was then released.  Skin was closed  with 4-0 Monocryl.  Derma band  placed for final dressing.  The patient  was then extubated, transferred to post ICU care in stable condition.  He will be monitored for 24 hours with pain control with Morphine and  Percocet.  Unfortunately, he is allergic to lidocaine and did not  receive this in his incision site.  I will likely be able to discharge  him in the morning with followup in 2 or 3 weeks' time.      Lennie Muckle, MD  Electronically Signed     ALA/MEDQ  D:  01/09/2008  T:  01/10/2008  Job:  [id number]   cc:   Venita Lick. Russella Dar, MD, [facility]

## 2011-01-18 NOTE — Assessment & Plan Note (Signed)
[facility]                            CARDIOLOGY OFFICE NOTE   Alex Higgins, Alex Higgins                    MRN:          [id number]  DATE:09/30/2008                            DOB:          1930-08-31    Alex Higgins is in for followup.  He does get some chest discomfort when  he lays down at night, when he takes 10 deep breaths he feels better.  He has had double vision on a couple of episodes.  He had had it about a  year ago and then it suddenly got better.  His wife thinks that when he  does not eat properly.  It only lasts about a minute.  The room is not  spinning.  He does not feel as though he is going to pass out.  The  patient has had carotid Doppler suggesting increase in subclavian  velocities, but his vertebrals are antegrade and he does not have high-  grade carotid stenoses.  The vision changes not accompanied by weakness  or facial abnormalities on one side or the other.  He has not had  exertional chest pain.   PHYSICAL EXAMINATION:  The blood pressure is 128/70, the pulse is 62.  There is questionable left carotid bruit, which has been previously  noted.  I do not appreciate any subclavian bruits.  Pulses in the wrists  are excellent bilaterally.  Pressures are equal.  The cardiac rhythm is  regular.  The extremities reveal no edema.   I am not sure what is precipitating these episodes.  His extraocular  muscles are intact.  His pupils are equal and active to light and  accommodation.  He does not have loud carotid bruits.  Whether or not it  could be embolizing is uncertain.  We will get a 2-D echo to make sure  there is not an additional source.  This was last done in 2006 and  certainly did not suggest the etiology.  He remains on antiplatelet  therapy at least in the form of an aspirin a day.  He is on cholesterol  lowering therapy as well.  He does not smoke.  I have encouraged him to  see the ophthalmologist.  We will see him  back in followup in 2 months.  If there is an increase in symptoms, he would clearly will need to see a  neurologist.     Alex Morton. Riley Kill, MD, [facility]  Electronically Signed    TDS/MedQ  DD: 09/30/2008  DT: 10/01/2008  Job #: [id number]

## 2011-01-18 NOTE — Letter (Signed)
December 24, 2007    Lennie Muckle, MD  [phone]  [address]  [place] [address]   RE:  Alex Higgins, Alex Higgins  MRN:  [id number]  /  DOB:  01-15-1930   Dear Dr. Freida Busman:   I had the pleasure of seeing Mr. Langston Masker in the office today in followup.  He was recently seen by the GI service, and Dr. Jonny Ruiz.  He has been set  up for a radionuclide imaging study of his heart.  His last cardiac  catheterization was in May 2007.  At that time, there was a vigorous  left ventricle with a non-dominant right coronary. The left main was  free of disease.  The LAD had 30% to 40% narrowing after the ostium, a  50% mid-narrowing, a 70% distal narrowing.  A distal circumflex lesion  was 30%.  There is a diagonal was 70% narrowing as well.  There was  moderate calcification of the left coronary artery.  He has not had  specific cardiac symptoms, although sometimes he notes with his heart  rate gets slow, he says his arms and legs feel numb.  He also sometimes  gets dizzy when he stands up.  Interestingly, he presented to the  emergency room after being seen by the urologist.  The urologist wanted  to send him over to the cardiology office, but it was snow day, and the  patient was seen in the emergency room were elevated liver functions  were noted.  My understanding is that he has gallbladder disease, and a  cholecystectomy has been recommended.  A liver biopsy has also been  recommended.  Importantly, he did have liver function abnormalities  followed up by Dr. Russella Dar on November 21, 2007, and at that time his LFTs  revealed an SGOT of 24 and SGPT of 26 and an albumin of 3.7.  Total  bilirubin was 1.4.  Other liver function studies were obtained.  Please  refer to Dr. Ardell Isaacs notes for evaluation of these.  My understanding is  that his liver biopsy is planned at the time of his surgery.   He denies any exertional-type chest pain.  He has not had significant  progressive shortness of breath.   His history  includes a history of colonic polyps, GI bleeding secondary  to duodenitis, history of irritable bowel.   ALLERGIES:  INCLUDE NOVOCAIN AND UROXATRAL.   MEDICATIONS:  Include:  1. Celexa 20 mg daily.  2. Zetia 10 mg daily.  3. Prilosec 20 mg b.i.d.  4. Pravachol 80 mg daily.  5. Finasteride 5 mg daily.  6. Aspirin 81 mg.   EXAMINATION:  VITAL SIGNS:  On examination today, the blood pressure is  128/70 and pulse was 57.  With standing, there is about 5 to 10-point  drop in blood pressure.  NECK:  Supple.  Carotid upstrokes were brisk.  The PMI is nondisplaced.  There is normal first and second heart sound without murmur, rub, or  gallop.  LUNGS:  Fields are clear to auscultation and percussion.  ABDOMEN:  Currently soft.  He has a lower incision scar in the right  lower abdomen and a previous site of hernia repair.   Electrocardiogram demonstrates sinus bradycardia, otherwise within  normal limits.   IMPRESSION:  1. Known scattered coronary artery disease, currently stable.  2. Mild orthostatic hypotension.  3. Hypercholesterolemia, on lipid-lowering therapy.  4. Gallbladder disease.   PLAN:  Radionuclide imaging study will be obtained tomorrow.  This  has  been actually ordered by Dr. Jonny Ruiz.  He does have orthostatic  hypotension, and I have encouraged him to remain well-hydrated on a  regular basis.  We will be happy to follow him in the hospital.    Sincerely,      Arturo Morton. Riley Kill, MD, [facility]  Electronically Signed    TDS/MedQ  DD: 12/24/2007  DT: 12/24/2007  Job #: [id number]   CC:   Venita Lick. Russella Dar, MD, Elise Benne, MD

## 2011-01-18 NOTE — Letter (Signed)
January 02, 2008    Lennie Muckle, MD  [address]  [address]  [place]  [address]   RE:  DEBORAH, DONDERO  MRN:  [id number]  /  DOB:  Jan 27, 1930   Dear Dr. Freida Busman,   Tilda Franco, as a preoperative maneuver, had a nuclear scan ordered  by Dr. Jonny Ruiz.  On the stress, he had poor exercise capacity at about 1 mm  of horizontal ST depression in the lateral leads in recovery, but no  chest pain nor dyspnea.  He had likely diaphragmatic attenuation with  images similar to 2005.   From a cardiac standpoint, I think that he would be relatively safe to  undergo laparoscopic cholecystectomy.  We will be happy to follow him  during his brief stay.  Please call us with any questions.    Sincerely,      Arturo Morton. Riley Kill, MD, [facility]  Electronically Signed    TDS/MedQ  DD: 01/02/2008  DT: 01/02/2008  Job #: [id number]

## 2011-01-21 NOTE — Consult Note (Signed)
NAME:  Alex Higgins, Alex Higgins NO.:  [id number]   MEDICAL RECORD NO.:  [id number]          PATIENT TYPE:  INP   LOCATION:  0381                         FACILITY:  [facility]   PHYSICIAN:  Charlaine Dalton. Sherene Sires, M.D. Romualdo Bolk OF BIRTH:  February 26, 1930   DATE OF CONSULTATION:  01/14/2005  DATE OF DISCHARGE:                                   CONSULTATION   REQUESTING PHYSICIAN:  Dr. Willow Ora.   CHIEF COMPLAINT:  Chest pain and cough.   HISTORY:  A 77 year old white male, remote smoker, who states for the last  month he has had intermittent cough productive mostly of mucoid sputum  associated with increased nasal congestion and left maxillary pain in the  same area where he had a tooth pulled approximately a year ago. He has  noticed increased sensitivity to his teeth when he chews, pain shooting up  to his eye, and also intermittent bloody nasal discharge. In addition to  these complaints, he has developed coughing paroxysms and hiccups and passed  out and was admitted on Jan 13, 2005 by the [facility] internal  medicine service and CT scan of the chest suggested right infiltrate. There  was evidence on initial head CT scan of chronic left maxillary sinusitis and  left sphenoid sinusitis. CT scan of the chest suggested a vague infiltrate  in the right.   In retrospect, the patient has been having chest pain off and on for a year.  He describes this pain as paroxysmal, coming on without warning typically in  the sitting position, made better by walking, always in the same location  anteriorly, and unrelieved by AcipHex or pleuritic in nature. He denies that  the cough makes it worse, states that walking makes it better, and is not  related to meals.   PAST MEDICAL HISTORY:  Significant for depression, GERD, dyslipidemia, and  ischemic heart disease status post remote stent placement.   MEDICATIONS:  Medications at home have in the past included baby aspirin,  AcipHex,  Pravachol, and Celexa. Medications here include aspirin, Avelox  (day #1), Celexa, Lioresal, Lovenox, Protonix, and Zocor.   SOCIAL HISTORY:  He quit smoking around 1960. He states he does not remember  the last time he had a rum and Coke.   FAMILY HISTORY:  Positive for coronary artery disease, negative for lung  cancer or respiratory disease.   REVIEW OF SYSTEMS:  Taken in detail and essentially negative except as  outlined above.   PHYSICAL EXAMINATION:  GENERAL:  This is a very unusual-acting white male  who has a difficult time answering a question in a straightforward manner.  VITAL SIGNS:  He is afebrile with normal vital signs here.  HEENT:  Unremarkable. Oropharynx is clear. I do not see any obvious dental  lesions.  NECK:  Supple without cervical adenopathy or tenderness. Trachea is in the  midline, no thyromegaly.  LUNGS:  Lung fields perfectly clear bilaterally to auscultation and  percussion.  HEART:  There is regular rhythm without murmur, gallop, rub present.  ABDOMEN:  Soft, benign.  EXTREMITIES:  Warm without calf tenderness, cyanosis,  or clubbing.   LABORATORY STUDIES:  Indicate a white count of 8100. Normal cardiac enzymes.  CT scan as above.   IMPRESSION:  1.  Vague air space density in the right upper lobe by CT scan, has the      classic appearance of what would be seen with aspiration of blood into      the airway or an early community-acquired pneumonia. Based on the fact      that he has had chronic sinus drainage for the last month - some of it      bloody - and has evidence of both left maxillary sinusitis and left      sphenoid sinusitis on the same side where he is having toothache, I      suspect that the problem here is one of chronic sinusitis with postnasal      drip syndrome and aspiration of either blood or purulent material into      the right upper lobe. I would treat this with Augmentin 875 b.i.d. and      nasal steroids for 2 weeks, and  then have him come back to the office      for follow-up.  2.  The right-sided anterior chest discomfort is totally nonspecific and      strongly suggestive to me of irritable bowel syndrome since it always      occurs in the sitting position, always in the same location, has been      present for over a year, and waxing and wanes with no specific pattern      related to meals, activity, movement, or coughing. I would adde Citrucel      one teaspoon b.i.d. to his regimen to see if this is eliminated.  3.  I cannot shed any light on his hiccups or on his syncope and would defer      further workup to internal medicine in this regard. However, I note this      patient has a bit of an unusual affect and would suggest that we get as      much objective information as possible to sort out what is functional      versus organic.   I do not believe any further pulmonary follow-up is needed in the hospital.  I would recommend 10 days of Augmentin and follow up in 2 weeks in the  office. We have not excluded the possibility of an early bronchioalveolar  carcinoma, but I think this would be extremely unlikely.      MBW/MEDQ  D:  01/14/2005  T:  01/14/2005  Job:  [id number]   cc:   Corwin Levins, M.D. [facility]

## 2011-01-21 NOTE — Assessment & Plan Note (Signed)
[facility]                              CARDIOLOGY OFFICE NOTE   TREVAN, MESSMAN                    MRN:          [id number]  DATE:03/15/2006                            DOB:          1930-05-30    REASON FOR VISIT:  Alex Higgins is back in for followup.  He has not had any  further episodes of syncope or presyncope since he stopped his isosorbide  mononitrate.  It is obvious that he was pretty sensitive to this.  He denies  any ongoing chest pain.  He says he is feeling pretty good at this point.   PHYSICAL EXAMINATION:  VITAL SIGNS:  The weight is 209 pounds, blood  pressure 138/78 supine.  It does not change more than 5 mm with standing.  The pulse if 58.  LUNGS:  The lung fields are clear.  CARDIAC:  The cardiac rhythm is regular.   IMPRESSION:  The patient is seen back today in followup.  He underwent  catheterization in May of this year and had preserved overall left  ventricular function.  The left anterior descending has about 50% eccentric  plaquing and about 70% disease distally.  There is a 50-70% first marginal  stenosis.  The right coronary is nondominant without critical narrowing.  At  the present time, we plan to continue to treat him medically.  I will see  him back in followup in about three months.                              Arturo Morton. Riley Kill, MD, [facility]    TDS/MedQ  DD:  04/17/2006  DT:  04/17/2006  Job #:  [id number]

## 2011-01-21 NOTE — Assessment & Plan Note (Signed)
[facility]                              CARDIOLOGY OFFICE NOTE   JP, EASTHAM                    MRN:          [id number]  DATE:06/16/2006                            DOB:          1930-01-03    Alex Higgins is in for a followup visit.  He is really doing quite well.  He has  had no episodes of syncope or pre-syncope.  He has virtually no chest pain,  except for when he moves from side to side.  He is feeling well.   PHYSICAL EXAMINATION:  The blood pressure is 115/67, the pulse is 59.  LUNGS:  Fields are clear.  CARDIAC:  Rhythm is regular without a significant murmur.  ABDOMEN:  Soft without obvious hepatosplenomegaly.   ELECTROCARDIOGRAM:  Sinus bradycardia.  Otherwise, within normal limits.   IMPRESSION:  1. Coronary artery disease.  2. Hypercholesterolemia on lipid-lowering therapy.   PLAN:  1. Continued risk factor reduction.  2. Return to the clinic in 6 months.       Arturo Morton. Riley Kill, MD, [facility]     TDS/MedQ  DD:  06/16/2006  DT:  06/18/2006  Job #:  [id number]

## 2011-01-21 NOTE — H&P (Signed)
[place]  Patient:    Alex Higgins, Alex Higgins                MRN: [id number] Adm. Date:  [id number] Attending:  Corwin Levins Dictator:   Corwin Levins, M.D. [facility]                         History and Physical  CHIEF COMPLAINT:  Chest pain.  HISTORY OF PRESENT ILLNESS:  Mr. Espindola is a 77 year old white male here for he above.  He awoke at 7 a.m. this morning with substernal chest discomfort radiating to the left side as well as the left neck and left shoulder and arm. Associated with shortness of breath.  No nausea, diaphoresis, or syncope, or palpitations. He has no prior history of coronary artery disease or heart disease.  He is a remote smoker.  Otherwise, no other risk factors except for positive family history. ne brother is deceased with CHF last year and another brother is alive with recent  CABG.  The pain was approximately 7 out of 10 on awakening, worse to exertion as he drove himself here, and 3 out of 10 on exam, now decreased to two out of 10 after nitroglycerin spray.  PAST MEDICAL HISTORY: 1.  GERD. 2.  History of colon polyp in 1985, last colonoscopy two years.  PAST SURGICAL HISTORY:  Appendectomy.  ALLERGIES:  NOVOCAINE.  MEDICATIONS: 1.  Zantac 150 mg b.i.d. 2.  Advil p.r.n.  SOCIAL HISTORY:   No tobacco, alcohol rare.  Married.  One daughter, who is a Engineer, civil (consulting).  FAMILY HISTORY:  As above.  PHYSICAL EXAMINATION:  VITAL SIGNS:  Blood pressure 130/80, respirations 18, pulse 80, temperature 98.6 degrees.  Weight 208 pounds.  GENERAL:  He is non-diaphoretic but complaining of discomfort.  HEENT:  Sclerae clear.  TMs clear.  Pharynx benign.  NECK:  Without lymphadenopathy, JVD, thyromegaly.  CHEST:  Without rales, wheezing.  CARDIAC:  Regular rate and rhythm, no murmur.  ABDOMEN:  Soft, nontender.  Positive bowel sounds.  No organomegaly.  No masses.  EXTREMITIES:  No edema.  NEUROLOGIC:  Cranial  nerves II-XII intact, otherwise nonfocal.  LABORATORY DATA:  ECG showed sinus rhythm, no acute changes.  ASSESSMENT/PLAN: 1.  Chest pain/unstable angina versus musculoskeletal.  He is to be admitted and     placed on telemetry.  Check routine labs including CPK, MB fraction, Troponin     I, EKGs.  Given oxygen, IV nitroglycerin, Lovenox, aspirin on arrival as well     as 1 mg IV morphine on arrival as well.  He is to be followed further by     Dr. Trisha Mangle and she plans to consult cardiology. DD:  10/13/99 TD:  10/13/99 Job: [id number]

## 2011-01-21 NOTE — Assessment & Plan Note (Signed)
[facility]                            CARDIOLOGY OFFICE NOTE   Alex, Higgins                    MRN:          [id number]  DATE:12/15/2006                            DOB:          [birth year]    Mr. Alex Higgins is in for a followup visit.  In general he is really doing  reasonably well.  He has been sometimes running even when he is not  supposed to.  He has not been having any significant chest pain.  Apparently he is to have some arthroscopic surgery by Dr. Madelon Lips in  June.  Otherwise he is doing well.   MEDICATIONS:  Include:  1. Celexa 20 mg daily.  2. Aspirin 81 mg daily.  3. Zetia 10 mg daily.  4. Prilosec 20 mg daily.  5. Pravachol 80 mg daily.   On physical he is alert and oriented.  He is jovial as usual.  Blood pressure is 133/88.  The pulse is 62.  The lung fields are clear.  The cardiac rhythm is regular without a significant murmur, rub or  gallop.   IMPRESSION:  1. Well preserved left ventricular function with scattered moderate      coronary artery disease.  2. Hypercholesterolemia on lipid lowering therapy with prior LDL      cholesterol near target at 72.   DISPOSITION:  EKG there is sinus bradycardia, borderline low voltage QRS.   PLAN:  1. Return to clinic in 6 months.  2. No overall change in medications.     Arturo Morton. Riley Kill, MD, [facility]  Electronically Signed    TDS/MedQ  DD: 12/21/2006  DT: 12/22/2006  Job #: [id number]

## 2011-01-21 NOTE — Cardiovascular Report (Signed)
[place]  Patient:    Alex Higgins, Alex Higgins                MRN: [id number] Proc. Date: 10/18/99 Adm. Date:  [id number] Attending:  Corwin Levins CC:         Veneda Melter, M.D. [facility]             Thomas D. Riley Kill, M.D. [facility]             Corwin Levins, M.D. [facility]                        Cardiac Catheterization  INDICATIONS:  Mr. Baby is a very pleasant 77 year old white male with multiple cardiac risk factors who has recently had left neck tightness and shortness of breath.  The patient was admitted to the hospital where he subsequently ruled out for acute myocardial infarction and underwent left heart catheterization on February 7.  This showed a severe narrowing of 80% in the distal left circumflex and the proximal segment of the third marginal branch as well as a narrowing of 70% in the distal PDA.  LV function was well-preserved.  The patient presents now for percutaneous intervention to the left circumflex artery.  TECHNIQUE:  After informed consent was obtained, the patient was brought to the  cardiac catheterization lab.  Both groins were sterilely prepped and draped. Lidocaine 1% was used to infiltrate the right groin and 7 French sheath placed nto the right femoral artery using the modified Seldinger technique.  A 7 [place] guide catheter was then used to engage the left coronary arteries, artery and selective angiography performed in various projections.  This confirmed the presence of a high-grade narrowing of 70-80% in the distal left circumflex and  the proximal segment of the third marginal branch.  Heparin and Integrilin were  then administered on a weight-adjusted basis and a 0.014 Sport wire was advanced into the distal circumflex.  A 2.5 x 20 mm CrossSail balloon was introduced and two inflations performed at 8 atmospheres for 30 seconds within the distal circumflex artery.  Repeat angiography showed reduction of the  80% narrowing to approximately 30%.  A 2.75 x 18 mm Tetra balloon was then carefully positioned under angiography and deployed at the proximal second and third marginal branch at 8 atmospheres or 35 seconds.  Repeat angiography showed correct positioning of the stent with mild residual waste in the mid section of the vessel.  A 3.0 x 9 mm Quantum balloon as then introduced to post dilate the stent.  The mid section of the stent was dilated at 10 atmospheres for 32 seconds, the distal segment at 8 atmospheres for 33 seconds and the proximal segment at 12 atmospheres for 35 seconds.  Repeat angiography showed an excellent result with a very mild residual narrowing in the proximal segment of the stent and it was felt that this segment probably represented a fibrotic segment of disease.  A repeat inflation of 14 atmospheres for 34 seconds was performed.  Final angiography was then performed showing excellent result with 0% residual narrowing with TIMI-3 flow through the left circumflex system.  There was no evidence of distal vessel damage.  The guide catheter was then removed and the sheath secured into position.  The patient tolerated the procedure well and was transferred to the floor in stable condition.  FINAL RESULTS:  Successful PTCA and stenting of the proximal segment of the third marginal  branch with reduction of 80% narrowing to 0% with placement of a 2.75 x 18 mm Tetra stent dilated to approximately 3.0 mm in the proximal segment and 2.8 mm distally. DD:  10/18/99 TD:  10/18/99 Job: [id number]

## 2011-01-21 NOTE — H&P (Signed)
NAME:  UDELL, MAZZOCCO NO.:  [id number]   MEDICAL RECORD NO.:  [id number]           PATIENT TYPE:   LOCATION:                                 FACILITY:   PHYSICIAN:  Arturo Morton. Riley Kill, M.D. [facility] DATE OF BIRTH:   DATE OF ADMISSION:  01/03/2006  DATE OF DISCHARGE:                                HISTORY & PHYSICAL   CHIEF COMPLAINT:  Chest discomfort.   HISTORY OF PRESENT ILLNESS:  Mr. Alex Higgins is a well known 77 year old  gentleman.  He has had some recurrent episodes of chest discomfort.  The  patient previously underwent catheterization in May 2005.  He had had a  stent placed in 2001 in the distal circumflex done by Dr. Chales Abrahams.  He has  continued to have some intermittent chest discomfort in 2005.  At the time  of catheterization, he had a 70% lesion in the LAD, 50% first diagonal, 50%  small diagonal, 50% at the origin of his major marginal with 70% mid  marginal and 50% narrowing in the previously stented site.  His ejection  fraction was 77%.  Recently, he has had recurrent and increasing chest  discomfort leading to discussion about cardiac catheterization.  Against  medical advice, he went onto [place] recently for a wedding and returned today.  He says he is continuing to have some of these symptoms.  Despite all of  this, we have been unsure as to whether or not these symptoms are cardiac or  not.  They occur in the area of the left collarbone, radiating down into the  left mid chest area and then down into the inframammary area.   PAST MEDICAL HISTORY:  1.  The patient had pneumonia in May 2006.  2.  Prior sinusitis.  3.  Hiccups with syncope.  4.  History of coronary artery disease.  5.  History of mild glucose intolerance.  6.  History of depression.  7.  History of gastroesophageal reflux.  8.  History of hyperlipidemia.   SOCIAL HISTORY:  The patient quit smoking around 1960.   FAMILY HISTORY:  It is positive for coronary artery disease but  negative for  lung cancer.   The patient denies diarrhea or major GI complaints at the present time.  He  does have some shortness of breath.  There is no orthopnea.  The review of  systems is otherwise negative.   His allergies include NOVOCAIN, UROXATRAL.   MEDICATIONS:  1.  Aciphex 20 mg daily.  2.  Pravastatin 80 mg daily.  3.  Celexa 20 mg daily.  4.  Aspirin 81 mg daily.  5.  Zetia 10.   PHYSICAL EXAMINATION:  GENERAL:  He is an alert and oriented gentleman, no  acute distress.  VITAL SIGNS:  Blood pressure is 110/64.  Pulse is 70.  NECK:  JVD is not dialed.  LUNGS:  Lung fields are clear to auscultation and percussion with some  decreased breath sounds in the base.  The PMI is nondisplaced.  Normal 1st  and 2nd heart sound.  No murmurs, rubs, or gallops noted.  ABDOMEN:  Soft.  Femoral pulses are intact.   Last chest x-ray done in February 25, 2005, revealed no evidence of active  disease or interval change with stable calcified granuloma in the right base  and mild COPD.   IMPRESSION:  1.  Recurrent substernal chest discomfort.  2.  Hypercholesterolemia.  3.  Prior stenting of the circumflex coronary artery.  4.  Other problems as noted.   PLAN:  Risks, benefits, and alternatives of cardiac catheterization have  been explained to the patient, and he is agreeable to proceed.      Arturo Morton. Riley Kill, M.D. [facility]  Electronically Signed     TDS/MEDQ  D:  01/03/2006  T:  01/03/2006  Job:  [id number]

## 2011-01-21 NOTE — Cardiovascular Report (Signed)
NAME:  Alex Higgins, Alex Higgins NO.:  [id number]   MEDICAL RECORD NO.:  [id number]          PATIENT TYPE:  INP   LOCATION:  2899                         FACILITY:  [facility]   PHYSICIAN:  Arturo Morton. Riley Kill, M.D. [facility] OF BIRTH:  1930-04-24   DATE OF PROCEDURE:  01/04/2006  DATE OF DISCHARGE:                              CARDIAC CATHETERIZATION   INDICATIONS:  Mr. Elmquist is a 77 year old gentleman who has had recurrent  episodes of chest discomfort.  They have been concerning to him, and  increasingly problematic.  His wife has been under a lot of stress.  He has  had radionuclide imaging and stress testing in the last two years which have  not been too revealing.  The patient does have known disease with prior  stenting of the circumflex coronary artery and distal disease of the LAD.  The current study was done to assess coronary anatomy.   PROCEDURE:  1.  Left heart catheterization  2.  Selective coronary arteriography.  3.  Hand injection into the left ventricle.   DESCRIPTION OF PROCEDURE:  The patient was brought to the catheterization  laboratory, prepped and draped in the usual fashion. Through an anterior  puncture, the right femoral artery was easily entered. A 6-French sheath was  then placed.  With aspiration of the catheter, there was evidence of some  shiny atheromatous debris with the aspiration.  There were, however, no  complications from the procedure whatsoever.  He tolerated procedure well.  We used a 6-French guiding catheter to inject the left system, because the  left is the left dominant system, and the previous study did not completely  opacify the coronaries as well.  The patient tolerated procedure well and  was taken to the holding area in satisfactory clinical condition. I then  reviewed the films with the patient's wife and son.  There were no  complications.   HEMODYNAMIC DATA.:  1.  Central aortic pressure 107/64, mean 85.  2.  Left  ventricular pressure 131/11.   ANGIOGRAPHIC DATA.:  1.  Hand injection was done into the left ventricle.  Although there was      minimal opacification of the LV, it appeared to be vigorous without      definite wall motion abnormality.  2.  The right coronary is a nondominant vessel with multiple small side      branches without significant focal narrowing.  3.  The left main is free of critical disease.  4.  The left anterior descending artery courses to the apex and wraps the      apical tip.  The LAD has some segmental plaquing of about 30 to 40% just      after the ostium.  In the mid vessel just after the takeoff of the      diagonal was an area of eccentric plaquing of about 50%.  The diagonal      itself is small to moderate in size with about 70% ostial narrowing. The      LAD in the mid portion of the distal vessel has about 70% area of mildly  segmental plaque.  Of note, this is not too dissimilar from the previous      study of 2005.  5.  The circumflex has been previously stented.  It is a dominant vessel.      Circumflex provides a first marginal branch that has about 50-70%      narrowing proximally.  The vessel itself is relatively small in caliber      overall. The AV circumflex has 30% narrowing without critical stenosis      at the stent site, there is about 30% narrowing in the intraluminal      stent.  The actual first posterolateral branch which is moderately large      looks better compared to the previous study.   CONCLUSION:  1.  Preserved overall left ventricular function.  2.  Continue scattered coronary disease without significant progression from      the previous study.   DISPOSITION:  The patient has been under a lot of stress.  Despite the  frequency of his symptoms, I am of the opinion that we would like to  continue to treat him medically.  I will follow the patient closely and see  him back in follow-up in the office.  We will adjust his  medicines  appropriately.  We may end up recommending a radionuclide study.      Arturo Morton. Riley Kill, M.D. [facility]  Electronically Signed     TDS/MEDQ  D:  01/04/2006  T:  01/05/2006  Job:  [id number]

## 2011-01-21 NOTE — Cardiovascular Report (Signed)
NAME:  Alex Higgins, Alex Higgins                       ACCOUNT NO.:  [id number]   MEDICAL RECORD NO.:  [id number]                   PATIENT TYPE:  OIB   LOCATION:  2899                                 FACILITY:  [facility]   PHYSICIAN:  Arturo Morton. Riley Kill, M.D.             DATE OF BIRTH:  June 13, 1930   DATE OF PROCEDURE:  11/06/2003  DATE OF DISCHARGE:  11/06/2003                              CARDIAC CATHETERIZATION   INDICATIONS:  Alex Higgins is a delightful 77 year old well known to me.  I  have seen in the office previously.  He has had some chest discomfort ever  since he had a stent placed in 2001 in the distal circumflex.  This was done  by Dr. Chales Abrahams and there was a nice angiographic result at that time.  He has  also had some headaches and a few other symptoms.  He underwent a Cardiolite  scan and did not have chest pain or ST depression and had a questionable  fixed defect in the inferior segment possibly thought to be due to  diaphragmatic attenuation.  Because of continued chest discomfort, he saw  Dr. Eden Emms and was subsequently referred for cardiac catheterization.   PROCEDURES:  1. Left heart catheterization.  2. Selective coronary arteriography.  3. Selective left ventriculography.   DESCRIPTION OF PROCEDURE:  The patient was brought to the catheterization  lab and prepped and draped in the usual fashion.  Through an anterior  puncture, the right femoral artery was easily entered.  A 6 French sheath  was placed.  Views of the left and right coronary arteries were obtained in  multiple angiographic projections.  Intracoronary nitroglycerin was  administered into the LAD to get a better view of the mid LAD.  He tolerated  the procedure well and there were no complications.  He was taken to the  holding area in satisfactory clinical condition.   HEMODYNAMIC DATA:  1. Central aorta:  114/63, mean 85.  2. Left ventricle:  105/11.  3. No gradient on pullback across the aortic  valve.   ANGIOGRAPHIC DATA:  1. Ventriculography was performed in the RAO projection.  Overall systolic     function was preserved.  Ejection fraction was calculated at 77%, but     this was a post PVC beat.  2. The left main was free of critical disease.  3. The left anterior descending courses to the apex.  There is about a 30%     area of proximal narrowing followed by 30% area of mid narrowing.  There     is a 70% area of focal narrowing in the junction of the mid and distal     vessel.  This has progressed from the previous study.  It looked slightly     worse after intracoronary nitroglycerin.  There is probably 40-50%     narrowing of the small diagonal.  There is a tiny first diagonal or  intermediate vessel that has about 50% narrowing.  4. The AV circumflex provides a marginal branch which courses posteriorly     and has about 50% ostial narrowing and a 70% mid narrowing, but it is an     extremely small vessel.  Leading into the posterior descending branch is     a stented vessel with perhaps 50% segmental scarring in the mid portion     of the stent but this does not appear to be high grade and there is a     fairly good lumen post intervention.  5. The right coronary is nondominant vessel.   CONCLUSIONS:  1. Preserved overall left ventricular function.  2. Continued patency of the previously placed stent to the posterior     circumflex.  3. Progression of moderate disease in the mid distal portion of the left     anterior descending artery with moderately high grade focal narrowing     enhanced by intracoronary nitroglycerin distal to an area of modest     segmental plaquing in the left anterior descending.   DISPOSITION:  I spoke with the patient on the table.  I have reviewed the  films with Dr. Juanda Chance.  We talked about the possibility of intravascular  ultrasound.  However, the patient prefers an initial trial of medical  therapy under any circumstance, and based  upon this I elected not to proceed  on to intravascular ultrasound to assess the left anterior descending.  He  will be seen in followup in addition to medical therapy considered.  The  patient currently is on Plavix.  He has been resistant to multiple  medications in terms of trying to avoid medications, but we will consider  lipid-lowering to try to prevent disease progression.                                               Arturo Morton. Riley Kill, M.D.    TDS/MEDQ  D:  11/06/2003  T:  11/07/2003  Job:  [id number]   cc:   Charlton Haws, M.D.

## 2011-01-21 NOTE — Discharge Summary (Signed)
NAME:  Alex Higgins, LIVERS NO.:  [id number]   MEDICAL RECORD NO.:  [id number]          PATIENT TYPE:  INP   LOCATION:  0381                         FAC[facility]   PHYSICIAN:  Corwin Levins, M.D. [facility]ATE OF BIRTH:  [birth year]   DATE OF ADMISSION:  01/13/2005  DATE OF DISCHARGE:  01/15/2005                                 DISCHARGE SUMMARY   DISCHARGE DIAGNOSES:  1.  Right pneumonia.  2.  Sinusitis.  3.  Persistent hiccups.  4.  Syncope, consistent with vasovagal.  5.  Chest pain, unclear etiology throughout patient work-up, possible      outpatient work-up further.  6.  History of coronary artery disease.  7.  Glucose intolerance, mild.  8.  History of depression.  9.  Gastroesophageal reflux disease.  10. Hyperlipidemia.  11. History of coronary artery disease, status post stent placement.   CONSULTS:  1.  Dr. Sherene Sires.  2.  Dr. Drue Novel, [facility].   PROCEDURE:  1.  Chest CT:  Ill-defined patchy opacity, lateral right upper lobe.  2.  Abdominal CT:  No acute findings.  Cholelithiasis incidentally noted.  3.  Pelvic CT:  Negative.  4.  Head CT:  Mild chronic sinusitis, no acute finding.   HISTORY AND PHYSICAL:  See that dictated day of admission, Jan 13, 2005.   HOSPITAL COURSE:  [name] is a 77 year old white male admitted with  hiccup syncope and chest discomfort coincident with upper respiratory  infection, possible sinusitis, otitis/bronchitis.  Further evaluation with  imaging as above further defined his illness as probable sinusitis and right  upper lobe pneumonia.  __________ to augment him p.o.  Hiccups seemed to be  less at time of discharge.  He remained afebrile, was ambulatory, otherwise  did quite well, and there was no further syncope, chest pain, or other  symptoms just prior to discharge.  As he is ambulatory, no further symptoms,  tolerating p.o. medications, and otherwise eating well, he was felt to be at  maximum benefit from  his hospitalization and is to be discharged home.   DISPOSITION:  Discharged to home in good condition.  No activity or dietary  restrictions.  He is to follow up with Dr. Jonny Ruiz in 1-2 weeks, Dr. Sherene Sires in 2  weeks, as requested.   DISCHARGE MEDICATIONS:  1.  Augmentin 875 mg p.o. b.i.d. x 10 days.  2.  Aspirin 325 mg p.o. daily.  3.  Celexa 20 mg p.o. daily.  4.  Nasonex as directed.  5.  Protonix 40 mg p.o. daily or PPI of choice.  6.  Zocor 40 mg p.o. daily.  7.  Ativan 1 mg t.i.d. p.r.n.      JWJ/MEDQ  D:  01/15/2005  T:  01/15/2005  Job:  [id number]   cc:   Charlaine Dalton. Sherene Sires, M.D. [facility]   Wanda Plump, MD [facility]  [phone] [place], [place] [address]

## 2011-01-21 NOTE — Cardiovascular Report (Signed)
[place]  Patient:    Alex Higgins, Alex Higgins                MRN: [id number] Proc. Date: 10/13/99 Adm. Date:  [id number] Attending:  Corwin Levins CC:         Corwin Levins, M.D. [facility]             Lewayne Bunting, M.D.             Arturo Morton. Riley Kill, M.D. [facility]                        Cardiac Catheterization  REFERRING PHYSICIAN:  Corwin Levins, M.D.  CARDIOLOGIST:  Lewayne Bunting, M.D.  REFERRING CARDIOLOGIST:  Arturo Morton. Riley Kill, M.D.  HISTORY:  Mr. Garlitz is a 77 year old gentleman with history of peptic ulcer disease, as well as reflux.  The patient presented to the emergency room with no evidence of substernal chest pain, brought on by some exertion and at rest. Initial electrocardiographic records showed no changes.  The initial set of enzymes was negative.  Dr. Riley Kill referred the patient for a diagnostic catheterization to assess his coronary anatomy.  PROCEDURE: 1. Left heart catheterization. 2. Selective coronary angiography. 3. Left ventriculography.  DESCRIPTION OF PROCEDURE:  After informed consent was obtained, the patient was  brought to the cardiac catheterization laboratory.  The right groin was sterilely prepped and draped.  Xylocaine 1% was used to infiltrate the right groin.  A 6-French arterial sheath was placed using the modified Seldinger technique. Selective coronary angiography was done with 6-French JL4 and JR4 catheters, which were used to engage the left and right coronary arteries.  Selective coronary angiography was performed in various projections, using manual injections of contrast.  Pigtail catheter was brought into the descending aorta and then into the ascending aorta, and subsequently into the ventricle.  Appropriate left-sided hemodynamics were obtained.  Subsequently a left ventriculogram was performed using power injections of contrast.  The pigtail was then pulled back, and no gradient was observed  across the aortic valve.  At the termination of the case the catheters and sheath were removed, and manual pressure applied until adequate hemostasis as achieved.  The patient tolerated the procedure well and was transferred to floor in stable condition.  FINDINGS:  HEMODYNAMICS: 1. LV pressure:  124/90 mmHg. 2. Aortic pressure:  126/58 mmHg.  CORONARY ANGIOGRAPHY: 1. Left Main Coronary Artery: Normal and has no disease. 2. Left Anterior Descending Artery:  A large-sized vessel, that wraps around the    apex.  There is mild plaquing noted at the level of the mid vessel.  There is    no flow-limiting coronary disease. 3. Left Circumflex Coronary Artery:  A large vessel, with PDA making the coronary    system left-dominant.  There is a 70% proximal stenosis in the posterior    descending artery, coming off the circumflex coronary artery.  Intracoronary  nitroglycerin was given and there was essentially no change in the size of    stenosis of this vessel. 4. Right Coronary Artery:  A small vessel, but with no disease.  LEFT VENTRICULOGRAM:  Ventriculography obtained in single-plane RAO projection revealed 70% EF, with no wall motion abnormalities.  There were segmental wall motion abnormalities.  CONCLUSIONS: 1. Single-vessel coronary artery disease. 2. Normal left ventricular systolic function, with no wall motion abnormality.  RECOMMENDATIONS:  The findings of the study were discussed with Dr. Riley Kill, who interviewed  the patient in the emergency room.  It was felt that although the patient was amenable to angioplasty and possible stent placement, his symptoms re not necessarily correlating with this finding.  The plan will be to treat the patient medically initially with Plavix and calcium channel blockers (possibly or spasm).   However, if the patient has recurrent symptoms consideration should be given to angioplasty of the posterior descending artery. DD:   10/13/99 TD:  10/14/99 Job: [id number]

## 2011-01-21 NOTE — H&P (Signed)
NAME:  Alex Higgins, Alex Higgins NO.:  [id number]   MEDICAL RECORD NO.:  [id number]          PATIENT TYPE:  INP   LOCATION:  0165                         FACILITY:  [facility]   PHYSICIAN:  Georgina Quint. Plotnikov, M.D. Romualdo Bolk OF BIRTH:  10-02-1929   DATE OF ADMISSION:  01/13/2005  DATE OF DISCHARGE:                                HISTORY & PHYSICAL   CHIEF COMPLAINT:  Chest pain, passed out.   HISTORY OF PRESENT ILLNESS:  The patient is a 77 year old male with hiccups  of six days duration accompanied by some mild chest pain relieved by walking  who passed out three times, the first time in the morning, second during the  day and third time at night time. He felt dizzy and light headed prior to  passing out. No nausea or vomiting, no seizures or memory loss noted. He  denies significant head injury. Five days ago, he was given doxycycline and  chlorpromazine by Dr. Jonny Ruiz for the hiccups and presumably ear infection. Two  days ago, he stopped his other medicines.   PAST MEDICAL HISTORY:  Depression, GERD, dyslipidemia, coronary disease  status post stent placement.   SOCIAL HISTORY:  He is a retired Art gallery manager, married, no alcohol, no tobacco.   FAMILY HISTORY:  Positive for coronary disease.   REVIEW OF SYMPTOMS:  Passed out once three months ago. Denies exertional  chest pain, no double vision, no headaches or neurologic symptoms. The rest  is negative or as above.   PHYSICAL EXAMINATION:  VITAL SIGNS:  Temperature 98.5, blood pressure  128/76, respirations 28, heart rate 96, sats 98% on room air.  GENERAL:  He is in no acute distress, looks well.  HEENT:  Moist mucosa.  NECK:  Supple. No thyromegaly or bruit.  LUNGS:  Clear. No wheezes or rales.  HEART:  S1, S2, no murmur, no gallop.  ABDOMEN:  Soft, nontender, no organomegaly, no masses, felt.  EXTREMITIES:  Lower extremities without edema. Calves nontender.  NEUROLOGIC:  He is alert, oriented, cooperative. Cranial  nerves II-XII  nonfocal. __________ muscle strength within normal limits, no tremor.  SKIN:  Clear.   LABORATORY DATA:  White count 8.1, hemoglobin 13.9, sodium 137, potassium  4.9, glucose 146, BUN 13, creatinine 1.3. Calcium 9.0. ALT, AST normal.   Chest x-ray without acute changes. CT of the head without bleeding. MRI  several days ago with empty sella, no acute changes. Myoglobin 82, CK-MB  less than 1.0, troponin less than 0.05.   ASSESSMENT/PLAN:  1.  Hiccups of six days duration. Will use Baclofen and Reglan. Etiology is      unclear. Need to obtain chest and abdomen CT scan to rule out underlying      pathology.  2.  Syncope. Possible __________ related to problem #1. Need to rule out      myocardial infarction. Obtain serial CK's. Will admit to telemetry.      Discontinue chlorpromazine and doxycycline.  3.  Chest pain atypical. Obtain D-dimer.  4.  Coronary disease with a history of stent placement. Plan as above. Check      EKG.  5.  Elevated glucose. Obtain hemoglobin A1c.      AVP/MEDQ  D:  01/13/2005  T:  01/13/2005  Job:  [id number]   cc:   Corwin Levins, M.D. [facility]   Maisie Fus D. Riley Kill, M.D. [facility]

## 2011-01-26 ENCOUNTER — Encounter: Payer: Self-pay | Admitting: Gastroenterology

## 2011-01-26 ENCOUNTER — Ambulatory Visit: Payer: Self-pay | Admitting: Gastroenterology

## 2011-02-10 ENCOUNTER — Encounter: Payer: Self-pay | Admitting: Gastroenterology

## 2011-02-10 ENCOUNTER — Ambulatory Visit (INDEPENDENT_AMBULATORY_CARE_PROVIDER_SITE_OTHER): Payer: Medicare Other | Admitting: Gastroenterology

## 2011-02-10 VITALS — BP 110/48 | HR 56 | Ht 73.0 in | Wt 201.0 lb

## 2011-02-10 DIAGNOSIS — Z85038 Personal history of other malignant neoplasm of large intestine: Secondary | ICD-10-CM

## 2011-02-10 DIAGNOSIS — Z86010 Personal history of colonic polyps: Secondary | ICD-10-CM

## 2011-02-10 DIAGNOSIS — R1031 Right lower quadrant pain: Secondary | ICD-10-CM

## 2011-02-10 MED ORDER — PEG-KCL-NACL-NASULF-NA ASC-C 100 G PO SOLR: 1.0000 | Freq: Once | ORAL | Status: DC

## 2011-02-10 MED ORDER — OMEPRAZOLE 20 MG PO CPDR: 20.0000 mg | DELAYED_RELEASE_CAPSULE | Freq: Two times a day (BID) | ORAL | Status: DC

## 2011-02-10 MED ORDER — OMEPRAZOLE 40 MG PO CPDR: 40.0000 mg | DELAYED_RELEASE_CAPSULE | Freq: Two times a day (BID) | ORAL | Status: DC

## 2011-02-10 NOTE — Patient Instructions (Addendum)
You have been scheduled for a Colonoscopy. Separate instructions given to patient.  Increase your omeprazole to 40mg  one tablet by mouth twice daily. A prescription has been sent the pharmacy.  Patient advised to avoid spicy, acidic, citrus, chocolate, mints, fruit and fruit juices.  Limit the intake of caffeine, alcohol and Soda.  Don't exercise too soon after eating.  Don't lie down within 3-4 hours of eating.  Elevate the head of your bed.

## 2011-02-10 NOTE — Progress Notes (Signed)
History of Present Illness: This is a 77 year old man here today with his wife. He has chronic right lower quadrant pain which has been persistently bothersome. It is there constantly it does not change with activity, movement, eating or bowel habits. He underwent a CT scan of the abdomen and pelvis in March 2011 that did not reveal any cause for his pain. Colonoscopy in 2009 was also unremarkable.  Current Medications, Allergies, Past Medical History, Past Surgical History, Family History and Social History were reviewed in [employer] record.  Physical Exam: General: Well developed , well nourished, no acute distress Head: Normocephalic and atraumatic Eyes:  sclerae anicteric, EOMI Ears: Normal auditory acuity Mouth: No deformity or lesions Lungs: Clear throughout to auscultation Heart: Regular rate and rhythm; no murmurs, rubs or bruits Abdomen: Soft, mild right lower quadrant tenderness near the superior border of his right lower quadrant incision no rebound or guarding and non distended. No masses, hepatosplenomegaly or hernias noted. Normal Bowel sounds Rectal: Deferred to colonoscopy Musculoskeletal: Symmetrical with no gross deformities  Pulses:  Normal pulses noted Extremities: No clubbing, cyanosis, edema or deformities noted Neurological: Alert oriented x 4, grossly nonfocal Psychological:  Alert and cooperative. Normal mood and affect  Assessment and Recommendations:  1. Chronic right lower quadrant pain. I suspect he has adhesions leading to his symptoms. No need for repeat CT scan. He is due for surveillance colonoscopy as below. Consider surgical consultation.  2. Alternating diarrhea and constipation. He has underlying irritable bowel syndrome. His right lower quadrant pain has not responded to antispasmodics in the past and it does not vary with bowel habits so I believe the right lower quadrant pain is unrelated to irritable bowel syndrome.  3.  History of colon cancer arising in a colon polyp. He is 1 year overdue for colonoscopy. Schedule colonoscopy. The risks, benefits, and alternatives to colonoscopy with possible biopsy and possible polypectomy were discussed with the patient and they consent to proceed.

## 2011-03-16 ENCOUNTER — Telehealth: Payer: Self-pay | Admitting: Gastroenterology

## 2011-03-16 MED ORDER — PEG-KCL-NACL-NASULF-NA ASC-C 100 G PO SOLR: 1.0000 | Freq: Once | ORAL | Status: DC

## 2011-03-16 NOTE — Telephone Encounter (Signed)
Prep kit resent to pharmacy.

## 2011-03-22 ENCOUNTER — Ambulatory Visit (AMBULATORY_SURGERY_CENTER): Payer: Medicare Other | Admitting: Gastroenterology

## 2011-03-22 ENCOUNTER — Encounter: Payer: Self-pay | Admitting: Gastroenterology

## 2011-03-22 DIAGNOSIS — Z86010 Personal history of colonic polyps: Secondary | ICD-10-CM

## 2011-03-22 DIAGNOSIS — D129 Benign neoplasm of anus and anal canal: Secondary | ICD-10-CM

## 2011-03-22 DIAGNOSIS — D126 Benign neoplasm of colon, unspecified: Secondary | ICD-10-CM

## 2011-03-22 DIAGNOSIS — R1031 Right lower quadrant pain: Secondary | ICD-10-CM

## 2011-03-22 DIAGNOSIS — D128 Benign neoplasm of rectum: Secondary | ICD-10-CM

## 2011-03-22 DIAGNOSIS — Z1211 Encounter for screening for malignant neoplasm of colon: Secondary | ICD-10-CM

## 2011-03-22 DIAGNOSIS — K621 Rectal polyp: Secondary | ICD-10-CM

## 2011-03-22 DIAGNOSIS — Z85038 Personal history of other malignant neoplasm of large intestine: Secondary | ICD-10-CM

## 2011-03-22 MED ORDER — SODIUM CHLORIDE 0.9 % IV SOLN: 500.0000 mL | INTRAVENOUS | Status: DC

## 2011-03-22 NOTE — Patient Instructions (Signed)
See the picture page for your colonoscopy findings from today.  Please follow the green and blue discharge instructions sheets today.  Resume your prior medications today.  Call if any questions or concerns.

## 2011-03-22 NOTE — Progress Notes (Signed)
Pt very sleepy.  Cool wash cloth to his face and neck.  MAW  Pt awake enough to dress by me.  Pt's wife has a "bad shoulder" and she states she can not pull on him if needed.  I took the pt off the monitor VSS and pt Dressed and placed in the conference room to try to wake up some more before d/c to home.  Pt's wife felt better with this decision. MAW  15:33 per pt "I am ready to go home. "  Pt was awake and his wife was okay with him going home.  Pt's wife was very complimentary of our care.  MAW

## 2011-03-23 ENCOUNTER — Telehealth: Payer: Self-pay

## 2011-03-23 NOTE — Telephone Encounter (Signed)

## 2011-03-28 ENCOUNTER — Encounter: Payer: Self-pay | Admitting: Gastroenterology

## 2011-04-07 ENCOUNTER — Other Ambulatory Visit: Payer: Self-pay | Admitting: Cardiology

## 2011-04-07 DIAGNOSIS — I6529 Occlusion and stenosis of unspecified carotid artery: Secondary | ICD-10-CM

## 2011-04-11 ENCOUNTER — Encounter (INDEPENDENT_AMBULATORY_CARE_PROVIDER_SITE_OTHER): Payer: Medicare Other | Admitting: *Deleted

## 2011-04-11 DIAGNOSIS — I6529 Occlusion and stenosis of unspecified carotid artery: Secondary | ICD-10-CM

## 2011-04-14 ENCOUNTER — Encounter: Payer: Self-pay | Admitting: Cardiology

## 2011-05-11 ENCOUNTER — Ambulatory Visit (INDEPENDENT_AMBULATORY_CARE_PROVIDER_SITE_OTHER)
Admission: RE | Admit: 2011-05-11 | Discharge: 2011-05-11 | Disposition: A | Discharge status: Home / Self Care | Payer: Medicare Other | Source: Ambulatory Visit | Attending: Internal Medicine | Admitting: Internal Medicine

## 2011-05-11 ENCOUNTER — Ambulatory Visit (INDEPENDENT_AMBULATORY_CARE_PROVIDER_SITE_OTHER): Payer: Medicare Other | Admitting: Internal Medicine

## 2011-05-11 ENCOUNTER — Other Ambulatory Visit (INDEPENDENT_AMBULATORY_CARE_PROVIDER_SITE_OTHER): Payer: Medicare Other

## 2011-05-11 ENCOUNTER — Encounter: Payer: Self-pay | Admitting: Internal Medicine

## 2011-05-11 VITALS — BP 102/60 | HR 66 | Temp 98.1°F | Ht 73.0 in | Wt 202.1 lb

## 2011-05-11 DIAGNOSIS — E119 Type 2 diabetes mellitus without complications: Secondary | ICD-10-CM

## 2011-05-11 DIAGNOSIS — R05 Cough: Secondary | ICD-10-CM

## 2011-05-11 DIAGNOSIS — R269 Unspecified abnormalities of gait and mobility: Secondary | ICD-10-CM

## 2011-05-11 DIAGNOSIS — Z0000 Encounter for general adult medical examination without abnormal findings: Secondary | ICD-10-CM | POA: Insufficient documentation

## 2011-05-11 DIAGNOSIS — R42 Dizziness and giddiness: Secondary | ICD-10-CM

## 2011-05-11 LAB — CBC WITH DIFFERENTIAL/PLATELET
Basophils Absolute: 0 10*3/uL (ref 0.0–0.1)
Lymphocytes Relative: 35 % (ref 12.0–46.0)
Monocytes Relative: 7.9 % (ref 3.0–12.0)
Neutrophils Relative %: 52.2 % (ref 43.0–77.0)
Platelets: 143 10*3/uL — ABNORMAL LOW (ref 150.0–400.0)
RDW: 13.9 % (ref 11.5–14.6)

## 2011-05-11 LAB — BASIC METABOLIC PANEL
CO2: 31 mEq/L (ref 19–32)
Calcium: 9.1 mg/dL (ref 8.4–10.5)
Glucose, Bld: 114 mg/dL — ABNORMAL HIGH (ref 70–99)
Potassium: 4.3 mEq/L (ref 3.5–5.1)
Sodium: 140 mEq/L (ref 135–145)

## 2011-05-11 LAB — HEMOGLOBIN A1C: Hgb A1c MFr Bld: 6.6 % — ABNORMAL HIGH (ref 4.6–6.5)

## 2011-05-11 LAB — HEPATIC FUNCTION PANEL
AST: 22 U/L (ref 0–37)
Albumin: 4.2 g/dL (ref 3.5–5.2)
Alkaline Phosphatase: 69 U/L (ref 39–117)
Total Protein: 6.8 g/dL (ref 6.0–8.3)

## 2011-05-11 MED ORDER — MECLIZINE HCL 12.5 MG PO TABS: 12.5000 mg | ORAL_TABLET | Freq: Three times a day (TID) | ORAL | Status: AC | PRN

## 2011-05-11 NOTE — Patient Instructions (Signed)
Take all new medications as prescribed - meclizine for dizziness Please go to LAB in the Basement for the blood and/or urine tests to be done today Please go to XRAY in the Basement for the x-ray test Please call the phone number [phone] (the PhoneTree System) for results of testing in 2-3 days;  When calling, simply dial the number, and when prompted enter the MRN number above (the Medical Record Number) and the # key, then the message should start. You will be contacted regarding the referral for: neurology Please return in 3 mo with Lab testing done 3-5 days before

## 2011-05-11 NOTE — Assessment & Plan Note (Signed)
C/w some type of vertigo by hx, for meclizine prn, also for labs, and neuro referral as per emr

## 2011-05-15 ENCOUNTER — Encounter: Payer: Self-pay | Admitting: Internal Medicine

## 2011-05-15 NOTE — Assessment & Plan Note (Signed)
stable overall by hx and exam, most recent data reviewed with pt, and pt to continue medical treatment as before  Lab Results  Component Value Date   HGBA1C 6.6* 05/11/2011

## 2011-05-15 NOTE — Assessment & Plan Note (Signed)
Likely multifactorial, pt for neuro eval as per emr as well

## 2011-05-15 NOTE — Assessment & Plan Note (Signed)
Unclear etiology except liekly exac by recent allergy rhinitis flare;  For cxr today as for some reason this is the most important thing he is here for today and asks several times;  I also suggested claritin or allegra otc prn, and consider flonase as well

## 2011-05-15 NOTE — Progress Notes (Signed)
Subjective:    Patient ID: Alex Higgins, male    DOB: 1929/11/26, 77 y.o.   MRN: [id number]  HPI  Here to f/u; c/o dry cough ongoing for 1 yr, now worse assoc with congestion and Does have several wks ongoing nasal allergy symptoms with clear congestion, itch and sneeze, without fever, pain, ST,  or wheezing. OTC benadryl has helped somewhat and prefers to cont this, though I suggest other longer acting such as claritin OTC.  Quite animated, somewhat agitated today, also c/o ear fullness, crackling, popping. Also c/o more ongoing chronic but worsening it seems gait problem where "I cant walk down the hall straight." without other overt worsening symtpoms - Pt denies chest pain, increased sob or doe, wheezing, orthopnea, PND, increased LE swelling, palpitations,or syncope, except has had some dizziness - ? Vertigo vs orthostatic worsening in the past wks as well.  Pt denies new neurological symptoms such as new headache, or facial or extremity weakness or numbness .   Pt denies polydipsia, polyuria  Denies worsening depressive symptoms, suicidal ideation, or panic, though has ongoing anxiety, not increased recently per pt.  Past Medical History  Diagnosis Date  . CAD (coronary artery disease)   . Hypertension   . Hyperlipidemia   . Renal insufficiency   . Peripheral edema   . GERD (gastroesophageal reflux disease)   . Diabetes mellitus, type 2   . Nonspecific elevation of levels of transaminase or lactic acid dehydrogenase (LDH)   . Headache   . Incisional hernia   . Oral aphthae   . Anxiety and depression   . BPH (benign prostatic hyperplasia)   . IBS (irritable bowel syndrome)   . Hepatitis B, chronic   . Iron deficiency anemia   . Tubular adenoma of colon 10/1993  . Thrombocytopenia   . Hearing loss   . Diverticulosis   . Hemorrhoids   . Colon cancer 1985    arising from a rectal polyp   Past Surgical History  Procedure Date  . Ptca     stents  . Appendectomy   .  Tonsillectomy   . Hernia repair   . Cholecystectomy 01/2008  . Hand surgery     bilateral  . Intraocular lens insertion   . Neck surgery     gland removed    reports that he quit smoking about 52 years ago. His smoking use included Cigarettes. He has never used smokeless tobacco. He reports that he drinks alcohol. He reports that he does not use illicit drugs. family history includes Diabetes in an unspecified family member; Heart disease in his mother; and Hypertension in an unspecified family member. Allergies  Allergen Reactions  . Dutasteride   . Procaine Hcl    Current Outpatient Prescriptions on File Prior to Visit  Medication Sig Dispense Refill  . citalopram (CELEXA) 20 MG tablet Take 20 mg by mouth daily.        Marland Kitchen ezetimibe (ZETIA) 10 MG tablet Take 10 mg by mouth daily.        . finasteride (PROSCAR) 5 MG tablet Take 5 mg by mouth daily.        Marland Kitchen omeprazole (PRILOSEC) 40 MG capsule Take 1 capsule (40 mg total) by mouth 2 (two) times daily.  60 capsule  11  . pravastatin (PRAVACHOL) 80 MG tablet Take 80 mg by mouth daily.         Current Facility-Administered Medications on File Prior to Visit  Medication Dose Route Frequency Provider Last Rate  Last Dose  . 0.9 %  sodium chloride infusion  500 mL Intravenous Continuous Meryl Dare, MD,FACG       Review of Systems Review of Systems  Constitutional: Negative for diaphoresis and unexpected weight change.  HENT: Negative for drooling and tinnitus.   Eyes: Negative for photophobia and visual disturbance.  Respiratory: Negative for choking and stridor.   Gastrointestinal: Negative for vomiting and blood in stool.     Objective:   Physical Exam BP 102/60  Pulse 66  Temp(Src) 98.1 F (36.7 C) (Oral)  Ht 6\' 1"  (1.854 m)  Wt 202 lb 2 oz (91.683 kg)  BMI 26.67 kg/m2  SpO2 96% Physical Exam  VS noted, not ill appearing Constitutional: Pt appears well-developed and well-nourished.  HENT: Head: Normocephalic.  Right  Ear: External ear normal.  Left Ear: External ear normal.  Bilat tm's mild erythema.  Sinus nontender.  Pharynx mild erythema Eyes: Conjunctivae and EOM are normal. Pupils are equal, round, and reactive to light.  Neck: Normal range of motion. Neck supple.  Cardiovascular: Normal rate and regular rhythm.   Pulmonary/Chest: Effort normal and breath sounds normal.  Abd:  Soft, NT, non-distended, + BS Neurological: Pt is alert. No cranial nerve deficit. motor/dtr intact; gait somewhat wide based Skin: Skin is warm. No erythema.  Psychiatric: Pt behavior is normal. Thought content normal. 2+ nervous    Assessment & Plan:

## 2011-05-20 ENCOUNTER — Encounter: Payer: Self-pay | Admitting: Neurology

## 2011-05-20 ENCOUNTER — Ambulatory Visit (INDEPENDENT_AMBULATORY_CARE_PROVIDER_SITE_OTHER): Payer: Medicare Other | Admitting: Neurology

## 2011-05-20 ENCOUNTER — Other Ambulatory Visit (INDEPENDENT_AMBULATORY_CARE_PROVIDER_SITE_OTHER): Payer: Medicare Other

## 2011-05-20 DIAGNOSIS — G609 Hereditary and idiopathic neuropathy, unspecified: Secondary | ICD-10-CM

## 2011-05-20 NOTE — Progress Notes (Signed)
Dear Dr. Jonny Ruiz,  Thank you for having me see Alex Higgins in consultation today for his problems with spells of losing consciousness and as well as gait instability.  As you may recall he is a 77 year old man with a history of diabetes, who has had spells of "blacking out" for approximately 3 years.  These seem to occur only when he stands up and typically are precipitated by standing up too quickly.  He carries the diagnosis of orthostatic hypotension and apparently has had multiple measurements of his blood pressure where his SBP has dropped over 50 points upon standing.  He also complains of bladder urgency with small volume voiding.  He has some difficulty in his memory as well.  In addition, he has had increasing problems with walking, frequently having to grab the walls when he walks.  He cannot take a shower without leaning on a wall.  This is worse with his eyes closed.  However, his falls seem to be more with the "blacking out".  He has had 2 falls in the last year.  MedHx:  He has a history of diabetes, hepatitis B, HLD.  SurgHx:  No back or neck surgeries or brain surgery.  FamHx:  No history of dementia.  However, brother has walking difficulties as well.  ROS:  13 systems were reviewed and reveal no numbness or tingling in his legs.    Other ROS diffusely positive, but remarkable difficulty swallowing solids and liquids.  He does not get early satiety.   Exam:  Filed Vitals:   05/20/11 1004 05/20/11 1200 05/20/11 1201 05/20/11 1202  BP: 110/60 120/68 124/64 126/64  Pulse: 64 56 60 56  Height: 6\' 1"  (1.854 m)     Weight: 200 lb (90.719 kg)      In general, he is a well appearing older man in NAD.    Cardiovascular: The patient has a regular rate and rhythm and left sided carotid bruit.  Fundoscopy:  Disks are flat. Arterial caliber decreased.  Mental status:   The patient is oriented to person, place and time. Recent and remote memory are intact. Attention span and  concentration are normal. Language including repetition, naming, following commands are intact. Fund of knowledge of current and historical events, as well as vocabulary are normal.  Cranial Nerves: Pupils are equally round and reactive to light. Visual fields full to confrontation. Extraocular movements are intact without nystagmus. Vertical eye movements appear normal.  Facial sensation and muscles of mastication are intact. Muscles of facial expression are symmetric. Hearing intact to bilateral finger rub. Tongue protrusion, uvula, palate midline.  Shoulder shrug intact  Motor:  The patient has normal bulk and tone, no pronator drift and 5/5 strength bilaterally.  There are no adventitious movements.  There is no rigidity or bradykinesia.  Reflexes:  Absent in UE, 2+ at knees, absent at ankles.  Coordination:  Normal finger to nose.  No dysdiadokinesia. H2S impaired.  Sensation is decreased to temperature in trunk, feet and hands, decreased to vibration in feet, and normal position sense.  Gait and Station are ataxic.    Romberg is positive.  MRI Brain reviewed from 02/2009 and is largely unremarkable.  No venticulomegaly and no atrophy of the cerebellum.  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  Although we didn't measure an orthostatic pressure drop, likely has autonomic neuropathy causing the same.  Black out spells are likely from the same mechanism.  Recommendations 1.  Sensory ataxia -  I will do screening neuropathy labs as well as an EMG/NCS.  While I think it is very unlikely it is demyelinating this will help exclude this. 2.  Orthostatic hypotension - I have counseled them to elevate the head of the bed as this can help improve orthostatic hypotension during the day.  We can also consider using midodrine or fludrocortisone if necessary.  Jobst stockings can also be helpful. 3.  Memory decline, urinary incontinence, walking difficulties.  It  behooves [place] to repeat his MRI brain to look for NPH, but I do think this is unlikely.  Thank you for having [place] see this patient in consultation.  Feel free to contact me with any questions.  Lupita Raider Modesto Charon, MD [facility], [place] [address] [place], [place] [address] Phone: [phone] Fax: [phone].

## 2011-05-20 NOTE — Patient Instructions (Signed)
Go to the basement to have your labs drawn today.  We have scheduled your nerve conduction studies/electromyelogram for Monday, Oct. 8th at [facility] at [address]. [place].  [phone].

## 2011-05-21 LAB — C-REACTIVE PROTEIN: CRP: 0.08 mg/dL (ref <0.60)

## 2011-05-21 LAB — HOMOCYSTEINE: Homocysteine: 14.9 umol/L (ref 4.0–15.4)

## 2011-05-23 ENCOUNTER — Telehealth: Payer: Self-pay | Admitting: Neurology

## 2011-05-23 NOTE — Telephone Encounter (Signed)
Pt would like blood test results from Friday 05/20/2011.

## 2011-05-24 LAB — PROTEIN ELECTROPHORESIS, SERUM
Albumin ELP: 61.7 % (ref 55.8–66.1)
Beta 2: 3.2 % (ref 3.2–6.5)
Beta Globulin: 4.9 % (ref 4.7–7.2)

## 2011-05-26 ENCOUNTER — Other Ambulatory Visit: Payer: Self-pay

## 2011-05-26 DIAGNOSIS — E538 Deficiency of other specified B group vitamins: Secondary | ICD-10-CM

## 2011-05-26 MED ORDER — CYANOCOBALAMIN 2000 MCG PO TABS: 2000.0000 ug | ORAL_TABLET | Freq: Every day | ORAL | Status: DC

## 2011-05-30 LAB — CBC
HCT: 45.4
Hemoglobin: 15.5
MCV: 93
Platelets: 153
RDW: 13.3

## 2011-05-30 LAB — DIFFERENTIAL
Basophils Absolute: 0
Eosinophils Absolute: 0.3
Eosinophils Relative: 6 — ABNORMAL HIGH
Lymphocytes Relative: 29
Lymphs Abs: 1.3
Monocytes Absolute: 0.4

## 2011-05-30 LAB — HEPATIC FUNCTION PANEL
ALT: 203 — ABNORMAL HIGH
AST: 284 — ABNORMAL HIGH
Alkaline Phosphatase: 97
Bilirubin, Direct: 0.9 — ABNORMAL HIGH
Indirect Bilirubin: 1.1 — ABNORMAL HIGH
Total Bilirubin: 2 — ABNORMAL HIGH

## 2011-05-30 LAB — I-STAT 8, (EC8 V) (CONVERTED LAB)
BUN: 13
Chloride: 103
Glucose, Bld: 181 — ABNORMAL HIGH
HCT: 48
Operator id: 295021
pCO2, Ven: 56.4 — ABNORMAL HIGH
pH, Ven: 7.319 — ABNORMAL HIGH

## 2011-05-30 LAB — PROTIME-INR: INR: 1

## 2011-06-03 ENCOUNTER — Encounter: Payer: Self-pay | Admitting: Internal Medicine

## 2011-06-03 ENCOUNTER — Ambulatory Visit (INDEPENDENT_AMBULATORY_CARE_PROVIDER_SITE_OTHER): Payer: Medicare Other | Admitting: Internal Medicine

## 2011-06-03 VITALS — BP 90/58 | HR 64 | Temp 97.9°F | Ht 73.0 in | Wt 199.0 lb

## 2011-06-03 DIAGNOSIS — R55 Syncope and collapse: Secondary | ICD-10-CM

## 2011-06-03 DIAGNOSIS — J019 Acute sinusitis, unspecified: Secondary | ICD-10-CM | POA: Insufficient documentation

## 2011-06-03 DIAGNOSIS — E119 Type 2 diabetes mellitus without complications: Secondary | ICD-10-CM

## 2011-06-03 MED ORDER — AZITHROMYCIN 250 MG PO TABS: ORAL_TABLET | ORAL | Status: AC

## 2011-06-03 NOTE — Patient Instructions (Addendum)
Your EKG was OK today You will be contacted regarding the referral for: Dr Riley Kill - I contacted cardiology who agreed to call you with appt probably for next wk with Dr Fredrik Cove will be contacted regarding the referral for: echocardiogram Continue all other medications as before

## 2011-06-03 NOTE — Progress Notes (Signed)
Subjective:    Patient ID: Alex Higgins, male    DOB: 05-16-1930, 77 y.o.   MRN: [id number]  HPI  Here to f/u, c/o frank syncopal episode approx 2 wks ago with fall striking the head, with subsequent dizziness, mild HA's, somewhat slowed mentation since then, did not seek med eval but no other neuro symtpoms and symtpoms now improved, but also with c/o lightheadedness worse recently orthostatic type where he is forced to slow and stand still for up to 5 sec when standing up to "adjust' before beginning to move forward;;  Tends to eat more salt in his diet, states BP has been on the lower side such as today as well;  Came in today after he was standing in a line, felt some dizzy and seemed to "zone out" and was very slow to respond and told he was staring without responding for a few seconds, before responding more normally. No actual syncope today, and o/w denies injury, trauma.  Pt denies chest pain, increased sob or doe, wheezing, orthopnea, PND, increased LE swelling, palpitations, dizziness or syncope, except for the above.  Pt denies new neurological symptoms such as new headache, or facial or extremity weakness or numbness   Pt denies polydipsia, polyuria. Does have hx of recurrent episodes of dizziness quite signficant several yrs ago, followed per Dr Riley Kill that per pt seemed to spontaneously resolve. Incidentally -  Here with 3 days acute onset fever, facial pain, pressure, general weakness and malaise, and greenish d/c, with slight ST, but little to no cough. Past Medical History  Diagnosis Date  . CAD (coronary artery disease)   . Hypertension   . Hyperlipidemia   . Renal insufficiency   . Peripheral edema   . GERD (gastroesophageal reflux disease)   . Diabetes mellitus, type 2   . Nonspecific elevation of levels of transaminase or lactic acid dehydrogenase (LDH)   . Headache   . Incisional hernia   . Oral aphthae   . Anxiety and depression   . BPH (benign prostatic hyperplasia)    . IBS (irritable bowel syndrome)   . Hepatitis B, chronic   . Iron deficiency anemia   . Tubular adenoma of colon 10/1993  . Thrombocytopenia   . Hearing loss   . Diverticulosis   . Hemorrhoids   . Colon cancer 1985    arising from a rectal polyp   Past Surgical History  Procedure Date  . Ptca     stents  . Appendectomy   . Tonsillectomy   . Hernia repair   . Cholecystectomy 01/2008  . Hand surgery     bilateral  . Intraocular lens insertion   . Neck surgery     gland removed    reports that he quit smoking about 52 years ago. His smoking use included Cigarettes. He has never used smokeless tobacco. He reports that he drinks alcohol. He reports that he does not use illicit drugs. family history includes Diabetes in an unspecified family member; Heart disease in his mother; and Hypertension in an unspecified family member. Allergies  Allergen Reactions  . Dutasteride   . Procaine Hcl    Current Outpatient Prescriptions on File Prior to Visit  Medication Sig Dispense Refill  . citalopram (CELEXA) 20 MG tablet Take 20 mg by mouth daily.        . cyanocobalamin 2000 MCG tablet Take 1 tablet (2,000 mcg total) by mouth daily.  30 tablet  2  . ezetimibe (ZETIA) 10 MG tablet  Take 10 mg by mouth daily.        . finasteride (PROSCAR) 5 MG tablet Take 5 mg by mouth daily.        Marland Kitchen omeprazole (PRILOSEC) 40 MG capsule Take 1 capsule (40 mg total) by mouth 2 (two) times daily.  60 capsule  11  . pravastatin (PRAVACHOL) 80 MG tablet Take 80 mg by mouth daily.         Current Facility-Administered Medications on File Prior to Visit  Medication Dose Route Frequency Provider Last Rate Last Dose  . 0.9 %  sodium chloride infusion  500 mL Intravenous Continuous Meryl Dare, MD,FACG       Review of Systems Review of Systems  Constitutional: Negative for diaphoresis and unexpected weight change.  HENT: Negative for drooling and tinnitus.   Eyes: Negative for photophobia and visual  disturbance.  Respiratory: Negative for choking and stridor.   Gastrointestinal: Negative for vomiting and blood in stool.  Genitourinary: Negative for hematuria and decreased urine volume.  Musculoskeletal: Negative for gait problem.  Skin: Negative for color change and wound.  Neurological: Negative for tremors and numbness.    Objective:   Physical Exam BP 90/58  Pulse 64  Temp(Src) 97.9 F (36.6 C) (Oral)  Ht 6\' 1"  (1.854 m)  Wt 199 lb (90.266 kg)  BMI 26.25 kg/m2  SpO2 95% Physical Exam  VS noted, not ill appearing, Constitutional: Pt appears well-developed and well-nourished.  HENT: Head: Normocephalic.  Right Ear: External ear normal.  Left Ear: External ear normal.  Bilat tm's mild erythema.  Sinus tender bilat maxillary.  Pharynx mild erythema Eyes: Conjunctivae and EOM are normal. Pupils are equal, round, and reactive to light.  Neck: Normal range of motion. Neck supple.  Cardiovascular: Normal rate and regular rhythm.   Pulmonary/Chest: Effort normal and breath sounds normal.  Abd:  Soft, NT, non-distended, + BS Neurological: Pt is alert. No cranial nerve deficit. motor/dtr/gait intact Skin: Skin is warm. No erythema.  Psychiatric: Pt behavior is normal. Thought content normal. 2+ nervous, somewhat pressure speech   Assessment & Plan:

## 2011-06-03 NOTE — Assessment & Plan Note (Signed)
Mild to mod, for antibx course,  to f/u any worsening symptoms or concerns 

## 2011-06-03 NOTE — Assessment & Plan Note (Signed)
stable overall by hx and exam, most recent data reviewed with pt, and pt to continue medical treatment as before  Lab Results  Component Value Date   HGBA1C 6.6* 05/11/2011    

## 2011-06-03 NOTE — Assessment & Plan Note (Addendum)
ECG reviewed as per emr, etiology unclear, for liberal salt to diet, encouraged po intake including fluids, for echo, recent labs reviewed with pt, will ask if Dr Malka So would see sooner than planned f/u   Lab Results  Component Value Date   WBC 7.3 05/11/2011   HGB 15.2 05/11/2011   HCT 45.0 05/11/2011   PLT 143.0* 05/11/2011   CHOL 140 09/07/2010   TRIG 130.0 09/07/2010   HDL 30.50* 09/07/2010   LDLDIRECT 74.8 06/08/2010   ALT 17 05/11/2011   AST 22 05/11/2011   NA 140 05/11/2011   K 4.3 05/11/2011   CL 103 05/11/2011   CREATININE 1.7* 05/11/2011   BUN 18 05/11/2011   CO2 31 05/11/2011   TSH 0.24* 05/20/2011   PSA 1.12 09/07/2010   INR 1.0 ratio 11/30/2009   HGBA1C 6.6* 05/11/2011   MICROALBUR 4.3* 09/07/2010

## 2011-06-06 ENCOUNTER — Encounter: Payer: Self-pay | Admitting: Internal Medicine

## 2011-06-08 ENCOUNTER — Encounter: Payer: Self-pay | Admitting: *Deleted

## 2011-06-10 ENCOUNTER — Other Ambulatory Visit (HOSPITAL_COMMUNITY): Payer: Medicare Other | Admitting: Radiology

## 2011-06-13 ENCOUNTER — Other Ambulatory Visit: Payer: Self-pay

## 2011-06-15 ENCOUNTER — Ambulatory Visit (INDEPENDENT_AMBULATORY_CARE_PROVIDER_SITE_OTHER): Payer: Medicare Other | Admitting: Cardiology

## 2011-06-15 ENCOUNTER — Encounter: Payer: Self-pay | Admitting: Cardiology

## 2011-06-15 ENCOUNTER — Ambulatory Visit (HOSPITAL_COMMUNITY): Payer: Medicare Other | Attending: Internal Medicine | Admitting: Radiology

## 2011-06-15 VITALS — BP 120/73 | HR 71 | Resp 18 | Ht 73.0 in | Wt 202.0 lb

## 2011-06-15 DIAGNOSIS — R55 Syncope and collapse: Secondary | ICD-10-CM | POA: Insufficient documentation

## 2011-06-15 DIAGNOSIS — I2510 Atherosclerotic heart disease of native coronary artery without angina pectoris: Secondary | ICD-10-CM | POA: Insufficient documentation

## 2011-06-15 DIAGNOSIS — I10 Essential (primary) hypertension: Secondary | ICD-10-CM | POA: Insufficient documentation

## 2011-06-15 DIAGNOSIS — E119 Type 2 diabetes mellitus without complications: Secondary | ICD-10-CM | POA: Insufficient documentation

## 2011-06-15 DIAGNOSIS — E785 Hyperlipidemia, unspecified: Secondary | ICD-10-CM | POA: Insufficient documentation

## 2011-06-15 DIAGNOSIS — I951 Orthostatic hypotension: Secondary | ICD-10-CM

## 2011-06-15 DIAGNOSIS — I959 Hypotension, unspecified: Secondary | ICD-10-CM | POA: Insufficient documentation

## 2011-06-15 DIAGNOSIS — K219 Gastro-esophageal reflux disease without esophagitis: Secondary | ICD-10-CM | POA: Insufficient documentation

## 2011-06-15 DIAGNOSIS — Z87891 Personal history of nicotine dependence: Secondary | ICD-10-CM | POA: Insufficient documentation

## 2011-06-15 DIAGNOSIS — R609 Edema, unspecified: Secondary | ICD-10-CM | POA: Insufficient documentation

## 2011-06-15 NOTE — Assessment & Plan Note (Signed)
Last LDL was 84

## 2011-06-15 NOTE — Assessment & Plan Note (Signed)
Probably secondary to postural hypotension.  Has big drop.  Reviewed all of the principles with him.  Have rec Jobst stockings, knee length, 20-30 lb stockings, liberal use of water and and sodium, avoiding heat, careful getting up.  Suggested he not drive until seen back in follow up.  Etiology could be other.  Echo is pending.

## 2011-06-15 NOTE — Assessment & Plan Note (Signed)
Will accept a higher pressure if needed to avoid postural hypotension.  Discussed at length with patient.

## 2011-06-15 NOTE — Progress Notes (Signed)
HPI:  Patient was added on by Dr. Jonny Ruiz for evaluation of syncope.  The patient has had a history of this.  He was working with some plywood, and was pretty hot.  He had been working for about an hour.  He went to drink some water, and passed out.  It was unwitnessed.  He said he had a concussion, but there was no physical evidence of trauma.  He does say his head is still sore.  He is also being seen in neuro at [facility] for peripheral neuropathy.  His wife complains that he has not been drinking fluids like he should    He has a history of this.  He also has trouble with walking straight at times.  Denies chest pain.  Has seen Dr. Johney Frame in 2010 at the hospital with two prior episodes.  A 2D echo was ordered by Dr. Jonny Ruiz and is due today.  Carotids were recently completed, and findings as noted.    Current Outpatient Prescriptions  Medication Sig Dispense Refill  . citalopram (CELEXA) 20 MG tablet Take 20 mg by mouth daily.        . cyanocobalamin 2000 MCG tablet Take 1 tablet (2,000 mcg total) by mouth daily.  30 tablet  2  . ezetimibe (ZETIA) 10 MG tablet Take 10 mg by mouth daily.        . finasteride (PROSCAR) 5 MG tablet Take 5 mg by mouth daily.        Marland Kitchen omeprazole (PRILOSEC) 40 MG capsule Take 1 capsule (40 mg total) by mouth 2 (two) times daily.  60 capsule  11  . pravastatin (PRAVACHOL) 80 MG tablet Take 80 mg by mouth daily.         Current Facility-Administered Medications  Medication Dose Route Frequency Provider Last Rate Last Dose  . DISCONTD: 0.9 %  sodium chloride infusion  500 mL Intravenous Continuous Meryl Dare, MD,FACG        Allergies  Allergen Reactions  . Dutasteride   . Procaine Hcl     Past Medical History  Diagnosis Date  . CAD (coronary artery disease)   . Hypertension   . Hyperlipidemia   . Renal insufficiency   . Peripheral edema   . GERD (gastroesophageal reflux disease)   . Diabetes mellitus, type 2   . Nonspecific elevation of levels of transaminase  or lactic acid dehydrogenase (LDH)   . Headache   . Incisional hernia   . Oral aphthae   . Anxiety and depression   . BPH (benign prostatic hyperplasia)   . IBS (irritable bowel syndrome)   . Hepatitis B, chronic   . Iron deficiency anemia   . Tubular adenoma of colon 10/1993  . Thrombocytopenia   . Hearing loss   . Diverticulosis   . Hemorrhoids   . Colon cancer 1985    arising from a rectal polyp    Past Surgical History  Procedure Date  . Ptca     stents  . Appendectomy   . Tonsillectomy   . Hernia repair   . Cholecystectomy 01/2008  . Hand surgery     bilateral  . Intraocular lens insertion   . Neck surgery     gland removed    Family History  Problem Relation Age of Onset  . Heart disease Mother     Brothers x 2  . Diabetes    . Hypertension      History   Social History  . Marital Status:  Married    Spouse Name: N/A    Number of Children: 1  . Years of Education: N/A   Occupational History  . retired    Social History Main Topics  . Smoking status: Former Smoker    Types: Cigarettes    Quit date: 09/05/1958  . Smokeless tobacco: Never Used  . Alcohol Use: Yes     1 beer once a month  . Drug Use: No  . Sexually Active: Not on file   Other Topics Concern  . Not on file   Social History Narrative  . No narrative on file    ROS: Please see the HPI.  All other systems reviewed and negative.  PHYSICAL EXAM:  BP 120/73  Pulse 71  Resp 18  Ht 6\' 1"  (1.854 m)  Wt 202 lb (91.627 kg)  BMI 26.65 kg/m2  BP by me:  144/77 R=L, 110/70 in sitting position or standing.    General: Well developed, well nourished, in no acute distress. Head:  Normocephalic and atraumatic. Neck: no JVD Lungs: Clear to auscultation and percussion. Heart: Normal S1 and S2.  No murmur, rubs or gallops.  Abdomen:  Normal bowel sounds; soft; non tender; no organomegaly Pulses: Pulses normal in all 4 extremities. Extremities: No clubbing or cyanosis. No  edema. Neurologic: Alert and oriented x 3.  EKG:  NSR.  WNL.   ASSESSMENT AND PLAN:

## 2011-06-15 NOTE — Patient Instructions (Signed)
Order for compression stockings given, remain hydrated, avoid dehydration, liberalize salt in the diet.  NO DRIVING at this time.   Your physician recommends that you schedule a follow-up appointment in: 6 WEEKS

## 2011-06-15 NOTE — Assessment & Plan Note (Signed)
See above

## 2011-06-15 NOTE — Assessment & Plan Note (Signed)
No angina at present.  

## 2011-06-16 ENCOUNTER — Other Ambulatory Visit (HOSPITAL_COMMUNITY): Payer: Medicare Other | Admitting: Radiology

## 2011-06-17 ENCOUNTER — Encounter: Payer: Self-pay | Admitting: Neurology

## 2011-06-17 ENCOUNTER — Ambulatory Visit (INDEPENDENT_AMBULATORY_CARE_PROVIDER_SITE_OTHER): Payer: Medicare Other | Admitting: Neurology

## 2011-06-17 ENCOUNTER — Ambulatory Visit: Payer: Medicare Other | Admitting: Neurology

## 2011-06-17 VITALS — BP 120/80 | HR 64 | Wt 202.0 lb

## 2011-06-17 DIAGNOSIS — I798 Other disorders of arteries, arterioles and capillaries in diseases classified elsewhere: Secondary | ICD-10-CM

## 2011-06-17 DIAGNOSIS — E1159 Type 2 diabetes mellitus with other circulatory complications: Secondary | ICD-10-CM

## 2011-06-17 DIAGNOSIS — E1151 Type 2 diabetes mellitus with diabetic peripheral angiopathy without gangrene: Secondary | ICD-10-CM

## 2011-06-17 NOTE — Patient Instructions (Signed)
We will call you in about 5 months for your 6 month follow up.

## 2011-06-17 NOTE — Progress Notes (Signed)
Dear Dr. Jonny Ruiz,  I saw  Alex Higgins back in [facility] for his problem with gait problems.  As you may recall, he is a 77 y.o. year old male with a history of orthostatic hypotension of unknown cause and diabetes who also has a progressive gait disorder.  Has had multiple falls, most of these accompanied by a loss of consciousness from his orthostatic hypotension, but also without loss of consciousness.  I felt that he had a fairly severe peripheral neuropathy on exam, sent him for NCS which confirmed a mixed peripheral neuropathy.  B12 was slightly low, but MMA was normal.  His HbA1C was slightly elevated as well.  He has since started on oral B12 as he did not want to get IM B12.  He says he has had one other fall with LOC and hitting his head since I saw him last.  Dr. Riley Kill who is managing his orthostatic hypotension has recommended increasing salt intake, and jobst stockings.  The patient did not tolerate elevating his bed.   Medical history, social history, family history, medications and allergies were reviewed and have not changed since the last clinic vist.  ROS:  13 systems were reviewed and are notable for mild numbness in his feet with no pain .  He denies early satiety.  All other review of systems are unremarkable.  Exam: . Filed Vitals:   06/17/11 1424  BP: 120/80  Pulse: 64  Weight: 202 lb (91.627 kg)    In general, well appearing older man in NAD.  Mental status:   The patient is oriented to person, place and time. Recent and remote memory are intact. Attention span and concentration are normal. Language including repetition, naming, following commands are intact. Fund of knowledge of current and historical events, as well as vocabulary are normal.  Cranial Nerves: Pupils are equally round and reactive to light.  Extraocular movements are intact without mild end gaze bidirectional nystagmus.  Muscles of facial expression are symmetric.  Tongue  protrusion, uvula, palate midline.  Shoulder shrug intact  Motor:  Normal bulk and tone, no drift and 5/5 muscle strength bilaterally.  Reflexes:  Absent throughout.  Coordination:  Normal finger to nose.  Normal H2S.  Gait:  Mildly wide based station and gait.  Romberg he sways.  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  B12 may be contributing slightly.  I suspect his orthostatic hypotension and diabetic PN are related with their being an autonomic neuropathy as well.  Recommendations: 1. Peripheral neuropathy - will continue oral B12.  Also need to control his sugar.  He refuses to use a cane but this will become important if his condition progresses. 2. Orthostatic hypotension - will leave this up to Dr. Riley Kill to manage.  Will probably end up needing midodrine, fludro or pyridostigmine.  We will see the patient back in 6 months.  Lupita Raider Modesto Charon, MD [facility]

## 2011-06-20 ENCOUNTER — Encounter: Payer: Self-pay | Admitting: Internal Medicine

## 2011-07-07 ENCOUNTER — Encounter: Payer: Self-pay | Admitting: Neurology

## 2011-07-12 ENCOUNTER — Ambulatory Visit: Payer: Medicare Other | Admitting: Cardiology

## 2011-08-01 ENCOUNTER — Encounter: Payer: Self-pay | Admitting: Cardiology

## 2011-08-01 ENCOUNTER — Ambulatory Visit (INDEPENDENT_AMBULATORY_CARE_PROVIDER_SITE_OTHER): Payer: Medicare Other | Admitting: Cardiology

## 2011-08-01 DIAGNOSIS — I951 Orthostatic hypotension: Secondary | ICD-10-CM

## 2011-08-01 DIAGNOSIS — R55 Syncope and collapse: Secondary | ICD-10-CM

## 2011-08-01 DIAGNOSIS — D510 Vitamin B12 deficiency anemia due to intrinsic factor deficiency: Secondary | ICD-10-CM

## 2011-08-01 DIAGNOSIS — I2510 Atherosclerotic heart disease of native coronary artery without angina pectoris: Secondary | ICD-10-CM | POA: Insufficient documentation

## 2011-08-01 NOTE — Progress Notes (Signed)
Addended by: Iona Coach on: 08/01/2011 11:12 AM   Modules accepted: Orders

## 2011-08-01 NOTE — Patient Instructions (Signed)
You have been referred to Dr Johney Frame.  Your physician recommends that you continue on your current medications as directed. Please refer to the Current Medication list given to you today.  Your physician recommends that you schedule a follow-up appointment in: 4 MONTH with Dr Riley Kill

## 2011-08-01 NOTE — Assessment & Plan Note (Signed)
I stressed the need for this.

## 2011-08-01 NOTE — Progress Notes (Signed)
Patient ID: Alex Higgins, male   DOB: Dec 26, 1929, 77 y.o.   MRN: [id number]

## 2011-08-01 NOTE — Progress Notes (Signed)
HPI:  Patient is in for follow up.  He is generally stable.  He denies passing out, or anything close.  He says he has not passed out in a long time.  However, he refuses stockings.  He has had some diet treated diabetes, and he has lost about 5 pounds trying recently.  Denies chest pain.  He says he drinks fluids, but his wife says he does not.Alex Higgins  He clearly has an aura prior to all of this.  Overall, he has remained stable.  His episodes are pretty dramatic when they occur, but they are not predictable.    Current Outpatient Prescriptions  Medication Sig Dispense Refill  . citalopram (CELEXA) 20 MG tablet Take 20 mg by mouth daily.        Alex Higgins ezetimibe (ZETIA) 10 MG tablet Take 10 mg by mouth daily.        . finasteride (PROSCAR) 5 MG tablet Take 5 mg by mouth daily.        Alex Higgins omeprazole (PRILOSEC) 40 MG capsule Take 1 capsule (40 mg total) by mouth 2 (two) times daily.  60 capsule  11  . pravastatin (PRAVACHOL) 80 MG tablet Take 80 mg by mouth daily.          Allergies  Allergen Reactions  . Dutasteride   . Procaine Hcl     Past Medical History  Diagnosis Date  . CAD (coronary artery disease)   . Hypertension   . Hyperlipidemia   . Renal insufficiency   . Peripheral edema   . GERD (gastroesophageal reflux disease)   . Diabetes mellitus, type 2   . Nonspecific elevation of levels of transaminase or lactic acid dehydrogenase (LDH)   . Headache   . Incisional hernia   . Oral aphthae   . Anxiety and depression   . BPH (benign prostatic hyperplasia)   . IBS (irritable bowel syndrome)   . Hepatitis B, chronic   . Iron deficiency anemia   . Tubular adenoma of colon 10/1993  . Thrombocytopenia   . Hearing loss   . Diverticulosis   . Hemorrhoids   . Colon cancer 1985    arising from a rectal polyp    Past Surgical History  Procedure Date  . Ptca     stents  . Appendectomy   . Tonsillectomy   . Hernia repair   . Cholecystectomy 01/2008  . Hand surgery     bilateral  .  Intraocular lens insertion   . Neck surgery     gland removed    Family History  Problem Relation Age of Onset  . Heart disease Mother     Brothers x 2  . Diabetes    . Hypertension      History   Social History  . Marital Status: Married    Spouse Name: N/A    Number of Children: 1  . Years of Education: N/A   Occupational History  . retired    Social History Main Topics  . Smoking status: Former Smoker    Types: Cigarettes    Quit date: 09/05/1958  . Smokeless tobacco: Never Used  . Alcohol Use: Yes     1 beer once a month  . Drug Use: No  . Sexually Active: Not on file   Other Topics Concern  . Not on file   Social History Narrative  . No narrative on file    ROS: Please see the HPI.  All other systems reviewed and negative.  PHYSICAL EXAM:  BP 121/71  Pulse 70  Wt 88.451 kg (195 lb)  General: Well developed, well nourished, in no acute distress. Head:  Normocephalic and atraumatic. Neck: no JVD.  No definite carotid bruits.   Lungs: Clear to auscultation and percussion. Heart: Normal S1 and S2.  No murmur, rubs or gallops.  Abdomen:  Normal bowel sounds; soft; non tender; no organomegaly Pulses: Pulses normal in all 4 extremities. Extremities: No clubbing or cyanosis. No edema. Neurologic: Alert and oriented x 3.  EKG:    ASSESSMENT AND PLAN:

## 2011-08-01 NOTE — Assessment & Plan Note (Signed)
Doing well.  I have been over and over this with him in detail.  Given how dramatic the episodes, I will have him see Dr. Graciela Husbands to see if we should be doing anything else.  I have stressed hydration, liberal use of Na.  It is likely linked to his diabetes, and orthostatic hypotension.  I will try to continue to emphasize this.

## 2011-08-01 NOTE — Assessment & Plan Note (Signed)
Currently normal on statins.

## 2011-08-01 NOTE — Assessment & Plan Note (Signed)
No symptoms.  He stopped his ASA because of dyspepsia.  He has non DES, now out 11 years with known patency.  Not really a problem

## 2011-08-01 NOTE — Assessment & Plan Note (Signed)
Mild.  See results

## 2011-08-08 NOTE — Progress Notes (Signed)
Summary: EARS CLEANED OUT rm 4   Vital Signs:  Patient Profile:   77 Years Old Male CC:      cerumen impaction  Height:     73 inches Weight:      202.75 pounds O2 Sat:      97 % O2 treatment:    Room Air Temp:     97.7 degrees F oral Pulse rate:   59 / minute Resp:     16 per minute BP sitting:   121 / 69  (left arm) Cuff size:   regular  Vitals Entered By: Clemens Catholic LPN (Jan 03, 1609 12:15 PM)                  Updated Prior Medication List: CELEXA 20 MG  TABS (CITALOPRAM HYDROBROMIDE) 1 by mouth once daily ZETIA 10 MG  TABS (EZETIMIBE) Take 1 tablet by mouth once a day PRAVACHOL 80 MG  TABS (PRAVASTATIN SODIUM) Take 1 tablet by mouth once a day FINASTERIDE 5 MG  TABS (FINASTERIDE) Take 1 tablet by mouth once a day OMEPRAZOLE 20 MG CPDR (OMEPRAZOLE) 1po once two times a day ASPIRIN 81 MG TBEC (ASPIRIN) Take one tablet by mouth daily  Current Allergies (reviewed today): ! NOVOCAIN ! * UROXATROL ! * RAPAFLO ! * AVODARTHistory of Present Illness History from: patient & wife Chief Complaint: cerumen impaction  History of Present Illness: Here because he has earwax in both ears.  He usually gets it flushed out yearly but it's only been 6 months.  He usually goes to an ENT to have it done.  He is feeling pressure in his ears which is how he is aware that he has wax.  No URI symptoms, F/C.  REVIEW OF SYSTEMS Constitutional Symptoms      Denies fever, chills, night sweats, weight loss, weight gain, and fatigue.  Eyes       Complains of eye pain, eye drainage, and glasses.      Denies change in vision, contact lenses, and eye surgery. Ear/Nose/Throat/Mouth       Complains of change in hearing, ear pain, ear discharge, and frequent runny nose.      Denies hearing loss/aids, dizziness, frequent nose bleeds, sinus problems, sore throat, hoarseness, and tooth pain or bleeding.  Respiratory       Complains of shortness of breath and bronchitis.      Denies dry cough,  productive cough, wheezing, asthma, and emphysema/COPD.  Cardiovascular       Complains of chest pain and fanting.      Denies murmurs, tires easily with exhertion, and fainting.    Gastrointestinal       Complains of stomach pain.      Denies nausea/vomiting, diarrhea, constipation, blood in bowel movements, and indigestion. Genitourniary       Denies painful urination, kidney stones, and loss of urinary control. Neurological       Denies paralysis. Musculoskeletal       Denies muscle pain, joint pain, joint stiffness, decreased range of motion, redness, swelling, muscle weakness, and gout.  Skin       Complains of bruising.      Denies unusual mles/lumps or sores and hair/skin or nail changes.  Psych       Denies mood changes, temper/anger issues, anxiety/stress, speech problems, depression, and sleep problems. Other Comments: pt c/o cerumen impaction. he has seen Dr Pollyann Kennedy ENT for this in the past but he could not get an appt soon and  he is beginning to have pain.    Past History:  Past Medical History: Reviewed history from 11/30/2009 and no changes required. Current Problems:  CORONARY ARTERY DISEASE (ICD-414.00) POSTURAL HYPOTENSION (ICD-458.0) HYPERTENSION (ICD-401.9) HYPERLIPIDEMIA (ICD-272.4) RENAL INSUFFICIENCY, ACUTE (ICD-585.9) SYNCOPE (ICD-780.2) PERIPHERAL EDEMA (ICD-782.3) GERD (ICD-530.81) DIABETES MELLITUS, TYPE II (ICD-250.00)- ABDOMINAL PAIN, GENERALIZED (ICD-789.07) SHOULDER PAIN, LEFT (ICD-719.41) BRONCHITIS, ACUTE WITH MILD BRONCHOSPASM (ICD-466.0) UNSPECIFIED HEARING LOSS (ICD-389.9) OTITIS EXTERNA, RIGHT (ICD-380.10) LUMBAR RADICULOPATHY, LEFT (ICD-724.4) HEADACHE (ICD-784.0) PERSONAL HX COLON CANCER (ICD-V10.05) TRANSAMINASES, SERUM, ELEVATED (ICD-790.4) ABDOMINAL PAIN RIGHT LOWER QUADRANT (ICD-789.03) IMPACTED CERUMEN (ICD-380.4) OTITIS MEDIA, ACUTE, RIGHT (ICD-382.9) PREOPERATIVE EXAMINATION (ICD-V72.84) HERNIA, INCISIONAL (ICD-553.21) APHTHOUS  ULCERS (ICD-528.2) HEMATURIA UNSPECIFIED (ICD-599.70) LIVER FUNCTION TESTS, ABNORMAL (ICD-794.8) UTI (ICD-599.0) GLUCOSE INTOLERANCE, MINIMAL, HX OF (ICD-V12.2) DEPRESSION (ICD-311) ANXIETY (ICD-300.00) BENIGN PROSTATIC HYPERTROPHY (ICD-600.00) IBS (ICD-564.1) HEPATITIS B, HX OF (ICD-V12.09) GASTROINTESTINAL HEMORRHAGE, HX OF (ICD-V12.79) COLONIC POLYPS, HX OF (ICD-V12.72) IRON DEFICIENCY (ICD-280.9) BENIGN PROSTATIC HYPERTROPHY, HX OF (ICD-V13.8) THROMBOCYTOPENIA (ICD-287.5) HEARING LOSS, BILATERAL (ICD-389.9) glucose intolerance  Past Surgical History: Reviewed history from 05/09/2009 and no changes required. Appendectomy Tonsillectomy Hernia Repair Cholecystectomy 01/2008 Bilateral Hand surgery Bilateral eye lens implants  Family History: No FH of Colon Cancer Family History of Heart Disease: Mother, Brothers x 2 Family History Diabetes 1st degree relative Family History Hypertension  Social History: Reviewed history from 05/09/2009 and no changes required. Married Former Smoker Occupation: Retired Alcohol Use - yes-1 beer every 2 weeks Daily Caffeine Use-2 cups daily Illicit Drug Use - no Patient gets regular exercise. Physical Exam General appearance: well developed, well nourished, no acute distress Nasal: mucosa pink, nonedematous, no septal deviation, turbinates normal Oral/Pharynx: tongue normal, posterior pharynx without erythema or exudate MSE: oriented to time, place, and person [place]ash: both ears cerumen impaction R>L Post-wash: clear canals, no erythema Assessment New Problems: IMPACTED CERUMEN (ICD-380.4) FAMILY HISTORY DIABETES 1ST DEGREE RELATIVE (ICD-V18.0)   Plan New Orders: New Patient Level III [16109] Cerumen Impaction Removal [id number] Planning Comments:   Earwax is removed.  Gave him a earwax syringe/blue tip to use at home and instructed on use.  Can use Debrox instead if he wishes.  Follow up here or ENT Q6-12 months for wax removal.  Follow-up with your primary care physician if not improving or if getting worse in the next few days.   The patient and/or caregiver has been counseled thoroughly with regard to medications prescribed including dosage, schedule, interactions, rationale for use, and possible side effects and they verbalize understanding.  Diagnoses and expected course of recovery discussed and will return if not improved as expected or if the condition worsens. Patient and/or caregiver verbalized understanding.   Orders Added: 1)  New Patient Level III [60454] 2)  Cerumen Impaction Removal [69210]

## 2011-08-11 ENCOUNTER — Encounter: Payer: Self-pay | Admitting: Internal Medicine

## 2011-08-11 ENCOUNTER — Other Ambulatory Visit (INDEPENDENT_AMBULATORY_CARE_PROVIDER_SITE_OTHER): Payer: Medicare Other

## 2011-08-11 ENCOUNTER — Ambulatory Visit (INDEPENDENT_AMBULATORY_CARE_PROVIDER_SITE_OTHER): Payer: Medicare Other | Admitting: Internal Medicine

## 2011-08-11 VITALS — BP 112/60 | HR 61 | Temp 97.0°F | Ht 73.0 in | Wt 194.2 lb

## 2011-08-11 DIAGNOSIS — Z125 Encounter for screening for malignant neoplasm of prostate: Secondary | ICD-10-CM

## 2011-08-11 DIAGNOSIS — IMO0001 Reserved for inherently not codable concepts without codable children: Secondary | ICD-10-CM

## 2011-08-11 DIAGNOSIS — Z0000 Encounter for general adult medical examination without abnormal findings: Secondary | ICD-10-CM

## 2011-08-11 DIAGNOSIS — E119 Type 2 diabetes mellitus without complications: Secondary | ICD-10-CM

## 2011-08-11 DIAGNOSIS — G629 Polyneuropathy, unspecified: Secondary | ICD-10-CM

## 2011-08-11 DIAGNOSIS — G2581 Restless legs syndrome: Secondary | ICD-10-CM

## 2011-08-11 DIAGNOSIS — D510 Vitamin B12 deficiency anemia due to intrinsic factor deficiency: Secondary | ICD-10-CM

## 2011-08-11 DIAGNOSIS — G609 Hereditary and idiopathic neuropathy, unspecified: Secondary | ICD-10-CM

## 2011-08-11 HISTORY — DX: Restless legs syndrome: G25.81

## 2011-08-11 HISTORY — DX: Polyneuropathy, unspecified: G62.9

## 2011-08-11 LAB — URINALYSIS, ROUTINE W REFLEX MICROSCOPIC
Bilirubin Urine: NEGATIVE
Hgb urine dipstick: NEGATIVE
Leukocytes, UA: NEGATIVE
Nitrite: NEGATIVE

## 2011-08-11 LAB — CBC WITH DIFFERENTIAL/PLATELET
Basophils Absolute: 0 10*3/uL (ref 0.0–0.1)
Eosinophils Absolute: 0.3 10*3/uL (ref 0.0–0.7)
MCHC: 34.4 g/dL (ref 30.0–36.0)
MCV: 95.6 fl (ref 78.0–100.0)
Monocytes Absolute: 0.4 10*3/uL (ref 0.1–1.0)
Neutrophils Relative %: 51 % (ref 43.0–77.0)
Platelets: 125 10*3/uL — ABNORMAL LOW (ref 150.0–400.0)

## 2011-08-11 LAB — HEPATIC FUNCTION PANEL
ALT: 22 U/L (ref 0–53)
AST: 26 U/L (ref 0–37)
Bilirubin, Direct: 0.3 mg/dL (ref 0.0–0.3)
Total Bilirubin: 2.2 mg/dL — ABNORMAL HIGH (ref 0.3–1.2)

## 2011-08-11 LAB — LIPID PANEL
Cholesterol: 108 mg/dL (ref 0–200)
LDL Cholesterol: 53 mg/dL (ref 0–99)
Total CHOL/HDL Ratio: 4

## 2011-08-11 LAB — BASIC METABOLIC PANEL
BUN: 16 mg/dL (ref 6–23)
CO2: 28 mEq/L (ref 19–32)
Chloride: 104 mEq/L (ref 96–112)
Potassium: 4.4 mEq/L (ref 3.5–5.1)

## 2011-08-11 LAB — MICROALBUMIN / CREATININE URINE RATIO
Creatinine,U: 191.3 mg/dL
Microalb Creat Ratio: 0.4 mg/g (ref 0.0–30.0)
Microalb, Ur: 0.7 mg/dL (ref 0.0–1.9)

## 2011-08-11 LAB — TSH: TSH: 0.59 u[IU]/mL (ref 0.35–5.50)

## 2011-08-11 NOTE — Patient Instructions (Addendum)
You will be contacted regarding the referral for: podiatry - Dr Charlsie Merles Continue all other medications as before Please go to LAB in the Basement for the blood and/or urine tests to be done today Please call the phone number [phone] (the PhoneTree System) for results of testing in 2-3 days;  When calling, simply dial the number, and when prompted enter the MRN number above (the Medical Record Number) and the # key, then the message should start. Please return in 1 year for your yearly visit, or sooner if needed, with Lab testing done 3-5 days before

## 2011-08-11 NOTE — Assessment & Plan Note (Signed)

## 2011-08-14 ENCOUNTER — Encounter: Payer: Self-pay | Admitting: Internal Medicine

## 2011-08-14 NOTE — Assessment & Plan Note (Signed)
Needs regular podiatry f/u with hx of DM - for referral

## 2011-08-14 NOTE — Assessment & Plan Note (Signed)
stable overall by hx and exam, most recent data reviewed with pt, and pt to continue medical treatment as before  Lab Results  Component Value Date   HGB 14.2 08/11/2011

## 2011-08-14 NOTE — Progress Notes (Signed)
Subjective:    Patient ID: Alex Higgins, male    DOB: 04/17/1930, 77 y.o.   MRN: [id number]  HPI Here for wellness and f/u;  Overall doing ok;  Pt denies CP, worsening SOB, DOE, wheezing, orthopnea, PND, worsening LE edema, palpitations, dizziness or syncope.  Pt denies neurological change such as new Headache, facial or extremity weakness.  Pt denies polydipsia, polyuria, or low sugar symptoms. Pt states overall good compliance with treatment and medications, good tolerability, and trying to follow lower cholesterol diet.  Pt denies worsening depressive symptoms, suicidal ideation or panic. No fever, wt loss, night sweats, loss of appetite, or other constitutional symptoms.  Pt states good ability with ADL's, low fall risk, home safety reviewed and adequate, no significant changes in hearing or vision, and occasionally active with exercise.  Has known peripheral neuropathy, has not been seing podiatry yrly.  Has been more diligent with diet and has gotten a few lbs off. Past Medical History  Diagnosis Date  . CAD (coronary artery disease)   . Hypertension   . Hyperlipidemia   . Renal insufficiency   . Peripheral edema   . GERD (gastroesophageal reflux disease)   . Diabetes mellitus, type 2   . Nonspecific elevation of levels of transaminase or lactic acid dehydrogenase (LDH)   . Headache   . Incisional hernia   . Oral aphthae   . Anxiety and depression   . BPH (benign prostatic hyperplasia)   . IBS (irritable bowel syndrome)   . Hepatitis B, chronic   . Iron deficiency anemia   . Tubular adenoma of colon 10/1993  . Thrombocytopenia   . Hearing loss   . Diverticulosis   . Hemorrhoids   . Colon cancer 1985    arising from a rectal polyp  . Peripheral neuropathy 08/11/2011  . RLS (restless legs syndrome) 08/11/2011   Past Surgical History  Procedure Date  . Ptca     stents  . Appendectomy   . Tonsillectomy   . Hernia repair   . Cholecystectomy 01/2008  . Hand surgery    bilateral  . Intraocular lens insertion   . Neck surgery     gland removed    reports that he quit smoking about 52 years ago. His smoking use included Cigarettes. He has never used smokeless tobacco. He reports that he drinks alcohol. He reports that he does not use illicit drugs. family history includes Diabetes in an unspecified family member; Heart disease in his mother; and Hypertension in an unspecified family member. Allergies  Allergen Reactions  . Dutasteride   . Procaine Hcl    Current Outpatient Prescriptions on File Prior to Visit  Medication Sig Dispense Refill  . citalopram (CELEXA) 20 MG tablet Take 20 mg by mouth daily.        Marland Kitchen ezetimibe (ZETIA) 10 MG tablet Take 10 mg by mouth daily.        . finasteride (PROSCAR) 5 MG tablet Take 5 mg by mouth daily.        Marland Kitchen omeprazole (PRILOSEC) 40 MG capsule Take 1 capsule (40 mg total) by mouth 2 (two) times daily.  60 capsule  11  . pravastatin (PRAVACHOL) 80 MG tablet Take 80 mg by mouth daily.         Review of Systems Review of Systems  Constitutional: Negative for diaphoresis, activity change, appetite change and unexpected weight change.  HENT: Negative for hearing loss, ear pain, facial swelling, mouth sores and neck stiffness.  Eyes: Negative for pain, redness and visual disturbance.  Respiratory: Negative for shortness of breath and wheezing.   Cardiovascular: Negative for chest pain and palpitations.  Gastrointestinal: Negative for diarrhea, blood in stool, abdominal distention and rectal pain.  Genitourinary: Negative for hematuria, flank pain and decreased urine volume.  Musculoskeletal: Negative for myalgias and joint swelling.  Skin: Negative for color change and wound.  Neurological: Negative for syncope and numbness.  Hematological: Negative for adenopathy.  Psychiatric/Behavioral: Negative for hallucinations, self-injury, decreased concentration and agitation.      Objective:   Physical Exam BP 112/60   Pulse 61  Temp(Src) 97 F (36.1 C) (Oral)  Ht 6\' 1"  (1.854 m)  Wt 194 lb 4 oz (88.111 kg)  BMI 25.63 kg/m2  SpO2 96% Physical Exam  VS noted Constitutional: Pt is oriented to person, place, and time. Appears well-developed and well-nourished.  HENT:  Head: Normocephalic and atraumatic.  Right Ear: External ear normal.  Left Ear: External ear normal.  Nose: Nose normal.  Mouth/Throat: Oropharynx is clear and moist.  Eyes: Conjunctivae and EOM are normal. Pupils are equal, round, and reactive to light.  Neck: Normal range of motion. Neck supple. No JVD present. No tracheal deviation present.  Cardiovascular: Normal rate, regular rhythm, normal heart sounds and intact distal pulses.   Pulmonary/Chest: Effort normal and breath sounds normal.  Abdominal: Soft. Bowel sounds are normal. There is no tenderness.  Musculoskeletal: Normal range of motion. Exhibits no edema.  Lymphadenopathy:  Has no cervical adenopathy.  Neurological: Pt is alert and oriented to person, place, and time. Pt has normal reflexes. No cranial nerve deficit.  Skin: Skin is warm and dry. No rash noted.  Psychiatric:  Has  normal mood and affect. Behavior is normal. 1+ nervous    Assessment & Plan:

## 2011-08-14 NOTE — Assessment & Plan Note (Signed)
stable overall by hx and exam, most recent data reviewed with pt, and pt to continue medical treatment as before  Lab Results  Component Value Date   HGBA1C 5.9 08/11/2011

## 2011-09-29 ENCOUNTER — Ambulatory Visit (INDEPENDENT_AMBULATORY_CARE_PROVIDER_SITE_OTHER): Payer: Medicare Other | Admitting: Internal Medicine

## 2011-09-29 ENCOUNTER — Encounter: Payer: Self-pay | Admitting: Internal Medicine

## 2011-09-29 VITALS — BP 116/62 | HR 74 | Ht 73.0 in | Wt 196.0 lb

## 2011-09-29 DIAGNOSIS — R55 Syncope and collapse: Secondary | ICD-10-CM

## 2011-09-29 NOTE — Progress Notes (Signed)
PCP:  Oliver Barre, MD, MD  The patient presents today for electrophysiology followup.  He was previously seen by me 6/10 following an episode of postural syncope.  He states he has done reasonably well since that time.  He is frequently unsteady due to neuropathy but denies any presyncope or syncope over the past 6 months.  He previously had florinef prescribed but does not recall taking this.  In addition, he has not been wearing support hose as recommended by Dr Riley Kill.  Today, he denies symptoms of palpitations, chest pain, shortness of breath, orthopnea, PND, lower extremity edema, or other concerns.  He reports frequent dry skin and "itching" for which he is taking benadryl twice daily.  The patient feels that he is tolerating medications without difficulties and is otherwise without complaint today.   Past Medical History  Diagnosis Date  . CAD (coronary artery disease)   . Hypertension   . Hyperlipidemia   . Renal insufficiency   . Peripheral edema   . GERD (gastroesophageal reflux disease)   . Diabetes mellitus, type 2   . Nonspecific elevation of levels of transaminase or lactic acid dehydrogenase (LDH)   . Headache   . Incisional hernia   . Oral aphthae   . Anxiety and depression   . BPH (benign prostatic hyperplasia)   . IBS (irritable bowel syndrome)   . Hepatitis B, chronic   . Iron deficiency anemia   . Tubular adenoma of colon 10/1993  . Thrombocytopenia   . Hearing loss   . Diverticulosis   . Hemorrhoids   . Colon cancer 1985    arising from a rectal polyp  . Peripheral neuropathy 08/11/2011  . RLS (restless legs syndrome) 08/11/2011  . Postural dizziness    Past Surgical History  Procedure Date  . Ptca     stents  . Appendectomy   . Tonsillectomy   . Hernia repair   . Cholecystectomy 01/2008  . Hand surgery     bilateral  . Intraocular lens insertion   . Neck surgery     gland removed    Current Outpatient Prescriptions  Medication Sig Dispense Refill  .  citalopram (CELEXA) 20 MG tablet Take 20 mg by mouth daily.        . DiphenhydrAMINE HCl (BENADRYL ALLERGY PO) Take by mouth 2 (two) times daily.      Marland Kitchen ezetimibe (ZETIA) 10 MG tablet Take 10 mg by mouth daily.        . finasteride (PROSCAR) 5 MG tablet Take 5 mg by mouth daily.        Marland Kitchen omeprazole (PRILOSEC) 40 MG capsule Take 1 capsule (40 mg total) by mouth 2 (two) times daily.  60 capsule  11  . pravastatin (PRAVACHOL) 80 MG tablet Take 80 mg by mouth daily.        . vitamin B-12 (CYANOCOBALAMIN) 1000 MCG tablet Take 1,000 mcg by mouth daily.        Allergies  Allergen Reactions  . Dutasteride   . Procaine Hcl     History   Social History  . Marital Status: Married    Spouse Name: N/A    Number of Children: 1  . Years of Education: N/A   Occupational History  . retired    Social History Main Topics  . Smoking status: Former Smoker    Types: Cigarettes    Quit date: 09/05/1958  . Smokeless tobacco: Never Used  . Alcohol Use: Yes     1  beer once a month  . Drug Use: No  . Sexually Active: Not on file   Other Topics Concern  . Not on file   Social History Narrative  . No narrative on file    Family History  Problem Relation Age of Onset  . Heart disease Mother     Brothers x 2  . Diabetes    . Hypertension      ROS-  All systems are reviewed and are negative except as outlined in the HPI above   Physical Exam: Filed Vitals:   09/29/11 1622 09/29/11 1624 09/29/11 1625 09/29/11 1626  BP: 130/72 110/58 108/58 116/62  Pulse: 68 72 70 74  Height:      Weight:        GEN- The patient is well appearing, alert and oriented x 3 today.   Head- normocephalic, atraumatic Eyes-  Sclera clear, conjunctiva pink Ears- hearing intact Oropharynx- clear Neck- supple, no JVP Lymph- no cervical lymphadenopathy Lungs- Clear to ausculation bilaterally, normal work of breathing Heart- Regular rate and rhythm, no murmurs, rubs or gallops, PMI not laterally  displaced GI- soft, NT, ND, + BS Extremities- no clubbing, cyanosis, or edema MS- no significant deformity or atrophy Skin- + dry skin Psych- euthymic mood, full affect Neuro- strength and sensation are intact  ekg today reveals sinus rhythm 66 bpm, PR 224, otherwise normal ekg  Assessment and Plan:

## 2011-09-29 NOTE — Patient Instructions (Signed)
Your physician recommends that you schedule a follow-up appointment in: as needed  

## 2011-09-29 NOTE — Assessment & Plan Note (Signed)
The patient has a h/o postural syncope and orthostatic intolerance, which may be exacerbated by peripheral neuropathy.  He has done very well with lifestyle modification.  He denies any syncope over the past 6 months.  I have encouraged compliance with PO hydration and salt liberalization.  In addition, I have again encouraged support hose, which he refuses to wear. If his symptoms worsen, we could again consider florinef. He will follow-up with Dr Riley Kill and I will see him as needed.  Given unsteadiness, I have encouraged him to avoid benadryl.  I have encouraged him to consider seeing a Dermatologist if routine OTC skin moisturizers do not adequately treat his dry skin.  Given advanced age, prior syncope, unsteadiness, and neuropathy, benadryl is a very bad idea for him long term.

## 2011-10-06 ENCOUNTER — Ambulatory Visit: Payer: Medicare Other | Admitting: Internal Medicine

## 2011-11-03 ENCOUNTER — Other Ambulatory Visit: Payer: Self-pay

## 2011-11-03 MED ORDER — PRAVASTATIN SODIUM 80 MG PO TABS: 80.0000 mg | ORAL_TABLET | Freq: Every day | ORAL | Status: DC

## 2011-11-03 MED ORDER — EZETIMIBE 10 MG PO TABS: 10.0000 mg | ORAL_TABLET | Freq: Every day | ORAL | Status: DC

## 2011-11-03 MED ORDER — OMEPRAZOLE 40 MG PO CPDR: 40.0000 mg | DELAYED_RELEASE_CAPSULE | Freq: Two times a day (BID) | ORAL | Status: DC

## 2011-11-03 MED ORDER — FINASTERIDE 5 MG PO TABS: 5.0000 mg | ORAL_TABLET | Freq: Every day | ORAL | Status: DC

## 2011-11-03 MED ORDER — CITALOPRAM HYDROBROMIDE 20 MG PO TABS: 20.0000 mg | ORAL_TABLET | Freq: Every day | ORAL | Status: DC

## 2011-11-03 NOTE — Telephone Encounter (Signed)
Called patient informed prescriptions are ready for pickup at the front desk at his convenience.

## 2011-11-29 ENCOUNTER — Ambulatory Visit: Payer: Medicare Other | Admitting: Cardiology

## 2012-01-05 ENCOUNTER — Emergency Department (INDEPENDENT_AMBULATORY_CARE_PROVIDER_SITE_OTHER)
Admission: EM | Admit: 2012-01-05 | Discharge: 2012-01-05 | Disposition: A | Discharge status: Home / Self Care | Payer: Medicare Other | Source: Home / Self Care | Attending: Family Medicine | Admitting: Family Medicine

## 2012-01-05 ENCOUNTER — Encounter: Payer: Self-pay | Admitting: *Deleted

## 2012-01-05 DIAGNOSIS — L570 Actinic keratosis: Secondary | ICD-10-CM

## 2012-01-05 MED ORDER — TRETINOIN 0.05 % EX CREA: TOPICAL_CREAM | CUTANEOUS | Status: DC

## 2012-01-05 NOTE — ED Provider Notes (Signed)
History     CSN: [id number]  Arrival date & time 01/05/12  1548   None     Chief Complaint  Patient presents with  . Rash      HPI Comments: Patient complains of approximately 6 month history of rash on his left face and left neck.  He states that the rash burns after he takes a shower and towels dry.  He has been using his wife's "shingles cream" intermittently with improvement.  He states that he has an appointment with a dermatologist on 01/19/12 but decided to come in earlier for evaluation.  Patient is a 78 y.o. male presenting with rash. The history is provided by the patient and the spouse.  Rash  This is a chronic problem. Episode onset: 6 months ago. The problem has not changed since onset.The problem is associated with nothing. There has been no fever. The rash is present on the face and neck. The pain is at a severity of 0/10. The patient is experiencing no pain. Associated symptoms include itching. Pertinent negatives include no blisters, no pain and no weeping.    Past Medical History  Diagnosis Date  . CAD (coronary artery disease)   . Hypertension   . Hyperlipidemia   . Renal insufficiency   . Peripheral edema   . GERD (gastroesophageal reflux disease)   . Diabetes mellitus, type 2   . Nonspecific elevation of levels of transaminase or lactic acid dehydrogenase (LDH)   . Headache   . Incisional hernia   . Oral aphthae   . Anxiety and depression   . BPH (benign prostatic hyperplasia)   . IBS (irritable bowel syndrome)   . Hepatitis B, chronic   . Iron deficiency anemia   . Tubular adenoma of colon 10/1993  . Thrombocytopenia   . Hearing loss   . Diverticulosis   . Hemorrhoids   . Colon cancer 1985    arising from a rectal polyp  . Peripheral neuropathy 08/11/2011  . RLS (restless legs syndrome) 08/11/2011  . Postural dizziness     Past Surgical History  Procedure Date  . Ptca     stents  . Appendectomy   . Tonsillectomy   . Hernia repair   .  Cholecystectomy 01/2008  . Hand surgery     bilateral  . Intraocular lens insertion   . Neck surgery     gland removed    Family History  Problem Relation Age of Onset  . Heart disease Mother     Brothers x 2  . Diabetes    . Hypertension      History  Substance Use Topics  . Smoking status: Former Smoker    Types: Cigarettes    Quit date: 09/05/1958  . Smokeless tobacco: Never Used  . Alcohol Use: Yes     1 beer once a month      Review of Systems  Skin: Positive for itching and rash.  All other systems reviewed and are negative.    Allergies  Dutasteride and Procaine hcl  Home Medications   Current Outpatient Rx  Name Route Sig Dispense Refill  . CITALOPRAM HYDROBROMIDE 20 MG PO TABS Oral Take 1 tablet (20 mg total) by mouth daily. 90 tablet 3  . BENADRYL ALLERGY PO Oral Take by mouth 2 (two) times daily.    Marland Kitchen EZETIMIBE 10 MG PO TABS Oral Take 1 tablet (10 mg total) by mouth daily. 90 tablet 3  . FINASTERIDE 5 MG PO TABS Oral Take  1 tablet (5 mg total) by mouth daily. 90 tablet 3  . OMEPRAZOLE 40 MG PO CPDR Oral Take 1 capsule (40 mg total) by mouth 2 (two) times daily. 180 capsule 3  . PRAVASTATIN SODIUM 80 MG PO TABS Oral Take 1 tablet (80 mg total) by mouth daily. 90 tablet 3  . TRETINOIN 0.05 % EX CREA  Apply thin layer to affected areas at bedtime. 20 g 0  . VITAMIN B-12 1000 MCG PO TABS Oral Take 1,000 mcg by mouth daily.      BP 110/57  Pulse 54  Temp(Src) 98.3 F (36.8 C) (Oral)  Resp 16  Ht 6\' 1"  (1.854 m)  Wt 189 lb (85.73 kg)  BMI 24.94 kg/m2  SpO2 96%  Physical Exam  HENT:  Head:         Below the left temple is 2cm dia area of hyperkeratotic skin, slightly scaly, thickened, and erythematous.  There is a smaller area beneath ear as noted on diagram.  Behind the left ear skin is slightly erythematous but not thickened.    ED Course  Procedures none      1. Actinic keratoses       MDM  Begin tretinoin cream 0.05% apply thin  layer at bedtime.  If increased irritation occurs, decrease to every other night. Avoid sun exposure. Followup with dermatologist as scheduled        Lattie Haw, MD 01/05/12 [phone]

## 2012-01-05 NOTE — ED Notes (Signed)
Pt c/o rash on the LT side of his neck/ face x . He also c/o it itching, burning and HA off and on. Denies fever. He has applied hydrocortisone cream which relieves the itching.

## 2012-01-05 NOTE — Discharge Instructions (Signed)
Actinic Keratosis Actinic keratosis is a growth on the skin that is considered to be precancerous. That means it could develop into skin cancer if it is not treated. About 1% of such growths will turn into skin cancer, therefore, it is important that the skin growth is removed. An actinic keratosis might be as small as a pinhead or as big as a quarter. They appear most often on areas of skin that get a lot of sun exposure throughout your life. These include the bald scalp, face, ears, lips, upper back and the backs of hands and forearms. An actinic keratosis usually looks like a scaly, rough spot of skin. Sometimes there might be a little tag of pink or gray skin growing off them.  CAUSES  Sun damage causes abnormal growth of skin cells. Actinic keratoses (more than one growth) are the result of sun damage. You are more likely to develop them if you:  Have light-colored skin.   Are older (actinic keratoses increases with age).   Sunburn easily.   Have spent a lot of time in the sun.   Have had a job that involves a lot of outdoor work, such as a lifeguard or farmer.  SYMPTOMS   Blotchy, red and white patches of skin.   A skin patch that feels rough (like sandpaper), scaly or crusty.   Patches of dry, white skin on the lips.   A patch of skin that is thinner than normal.   Skin that is tender to the touch.   Although a rare symptom; an area of skin that bleeds.  DIAGNOSIS  To decide if you have actinic keratosis, your caregiver will probably:  Ask about symptoms you have noticed.   Ask about your history of exposure to the sun.   Ask about your overall health history.   Examine the skin that concerns you. The caregiver may want to look at skin on other parts of your body that have had a lot of sun exposure.   Order a biopsy. A biopsy is the removal of a small sample of tissue from the patch of skin. It is then examined for signs of cancer.  TREATMENT  Actinic keratosis can be  treated several ways. Most of the time, treatments can be done in a clinic or in your caregiver's office. Be sure to discuss the different options with your caregiver. They include:  Surgical removal (curettage). A special surgical instrument (a curette) is used to scrape the growth.   Cryosurgery. Liquid nitrogen is used to freeze the patch of skin. Often it is sprayed on the area. The growth will eventually fall off.   5-FU (5-fluorouracil) cream. The cream is applied several times a day for up to 4 weeks. The skin often becomes red and irritated, but the growths will go away. This method is often used if actinic keratoses or the skin is badly damaged.   Chemical peel. Chemicals are applied to the skin on a small spot. The outer layers of skin at that spot are then peeled off.   Photodynamic therapy. A drug (photosensitizing agent) is put on the skin before exposing the skin to a strong light. Together, the drug and strong light destroys the actinic keratoses.   Imiquimod cream. A medicine usually applied to growths on the face or scalp.  PROGNOSIS  Early treatment of actinic keratoses usually gets rid of the growths without further worries. RELATED COMPLICATIONS  Skin irritation or redness from the treatment.   Scarring where   the patch of skin was removed.   Development of skin cancer. This rarely happens if the growths are removed early on.  HOME CARE INSTRUCTIONS   An adhesive bandage, and possibly gauze, will cover the treated area.   Change and remove the bandage as directed by your caregiver.   Keep the treated area dry as directed by your caregiver.   Apply any creams that your caregiver prescribed. Follow the directions carefully.   To prevent future skin damage:   Wear sunscreen year-round, not just in the summer. The winter sun can damage skin, too.   Wear long-sleeved clothing and wide-brimmed hats.   When possible, avoid midday exposure to the sun.   If you want  to look tan, try sunless tanning products (lotions and sprays). Avoid tanning beds.   Commit to regularly checking your skin for new changes.   Visit a skin doctor (dermatologist ) every year for a skin exam.  SEEK MEDICAL CARE IF:   The treated area of skin does not heal and becomes more irritated, red or bloody.   You notice other patches of skin that are similar to the actinic keratoses that were treated.  Document Released: 11/18/2008 Document Revised: 08/11/2011 Document Reviewed: 11/18/2008 [employer] Patient Information 2012 [employer] 

## 2012-01-09 ENCOUNTER — Telehealth: Payer: Self-pay | Admitting: *Deleted

## 2012-01-23 ENCOUNTER — Encounter: Payer: Self-pay | Admitting: Emergency Medicine

## 2012-01-23 ENCOUNTER — Emergency Department (INDEPENDENT_AMBULATORY_CARE_PROVIDER_SITE_OTHER)
Admission: EM | Admit: 2012-01-23 | Discharge: 2012-01-23 | Disposition: A | Discharge status: Home / Self Care | Payer: Medicare Other | Source: Home / Self Care

## 2012-01-23 DIAGNOSIS — H902 Conductive hearing loss, unspecified: Secondary | ICD-10-CM

## 2012-01-23 DIAGNOSIS — H6120 Impacted cerumen, unspecified ear: Secondary | ICD-10-CM

## 2012-01-23 NOTE — ED Notes (Signed)
Cerumen impaction x 2 weeks

## 2012-01-23 NOTE — Discharge Instructions (Signed)
Cerumen Impaction  A cerumen impaction is when the wax in your ear forms a plug. This plug usually causes reduced hearing. Sometimes it also causes an earache or dizziness. Removing a cerumen impaction can be difficult and painful. The wax sticks to the ear canal. The canal is sensitive and bleeds easily. If you try to remove a heavy wax buildup with a cotton tipped swab, you may push it in further.  Irrigation with water, suction, and small ear curettes may be used to clear out the wax. If the impaction is fixed to the skin in the ear canal, ear drops may be needed for a few days to loosen the wax. People who build up a lot of wax frequently can use ear wax removal products available in your local drugstore.  SEEK MEDICAL CARE IF:    You develop an earache, increased hearing loss, or marked dizziness.  Document Released: 09/29/2004 Document Revised: 08/11/2011 Document Reviewed: 11/19/2009  [employer] Patient Information 2012 [employer]

## 2012-01-23 NOTE — ED Provider Notes (Signed)
History     CSN: [id number]  Arrival date & time 01/23/12  0957   None     Chief Complaint  Patient presents with  . Cerumen Impaction    (Consider location/radiation/quality/duration/timing/severity/associated sxs/prior treatment) The history is provided by the patient.  Patient complains of decreased hearing to right ear.  States ear feels like it is "plugged up" associated with mild pain since yesterday, but reports decreased hearing for greater than one week.   Attempts to clear with ear scoops at home and warm water irrigation with no relief.  History of same to bilateral ear; no history of chronic ear infections, hearing loss or ear tubes. Denies fever, upper respiratory symptoms and headache.  + drainage from right ear and denies neck pain.  Past Medical History  Diagnosis Date  . CAD (coronary artery disease)   . Hypertension   . Hyperlipidemia   . Renal insufficiency   . Peripheral edema   . GERD (gastroesophageal reflux disease)   . Diabetes mellitus, type 2   . Nonspecific elevation of levels of transaminase or lactic acid dehydrogenase (LDH)   . Headache   . Incisional hernia   . Oral aphthae   . Anxiety and depression   . BPH (benign prostatic hyperplasia)   . IBS (irritable bowel syndrome)   . Hepatitis B, chronic   . Iron deficiency anemia   . Tubular adenoma of colon 10/1993  . Thrombocytopenia   . Hearing loss   . Diverticulosis   . Hemorrhoids   . Colon cancer 1985    arising from a rectal polyp  . Peripheral neuropathy 08/11/2011  . RLS (restless legs syndrome) 08/11/2011  . Postural dizziness     Past Surgical History  Procedure Date  . Ptca     stents  . Appendectomy   . Tonsillectomy   . Hernia repair   . Cholecystectomy 01/2008  . Hand surgery     bilateral  . Intraocular lens insertion   . Neck surgery     gland removed    Family History  Problem Relation Age of Onset  . Heart disease Mother     Brothers x 2  . Diabetes    .  Hypertension      History  Substance Use Topics  . Smoking status: Former Smoker    Types: Cigarettes    Quit date: 09/05/1958  . Smokeless tobacco: Never Used  . Alcohol Use: Yes     1 beer once a month      Review of Systems  All other systems reviewed and are negative.    Allergies  Dutasteride and Procaine hcl  Home Medications   Current Outpatient Rx  Name Route Sig Dispense Refill  . CITALOPRAM HYDROBROMIDE 20 MG PO TABS Oral Take 1 tablet (20 mg total) by mouth daily. 90 tablet 3  . BENADRYL ALLERGY PO Oral Take by mouth 2 (two) times daily.    Marland Kitchen EZETIMIBE 10 MG PO TABS Oral Take 1 tablet (10 mg total) by mouth daily. 90 tablet 3  . FINASTERIDE 5 MG PO TABS Oral Take 1 tablet (5 mg total) by mouth daily. 90 tablet 3  . OMEPRAZOLE 40 MG PO CPDR Oral Take 1 capsule (40 mg total) by mouth 2 (two) times daily. 180 capsule 3  . PRAVASTATIN SODIUM 80 MG PO TABS Oral Take 1 tablet (80 mg total) by mouth daily. 90 tablet 3  . TRETINOIN 0.05 % EX CREA  Apply thin layer to  affected areas at bedtime. 20 g 0  . VITAMIN B-12 1000 MCG PO TABS Oral Take 1,000 mcg by mouth daily.      BP 100/58  Pulse 58  Temp(Src) 98.3 F (36.8 C) (Oral)  Resp 16  Ht 6\' 1"  (1.854 m)  Wt 197 lb (89.359 kg)  BMI 25.99 kg/m2  SpO2 97%  Physical Exam  Constitutional: He is oriented to person, place, and time. Vital signs are normal. He appears well-developed and well-nourished. He is active and cooperative.  HENT:  Head: Normocephalic.  Right Ear: External ear normal. Decreased hearing is noted.  Left Ear: External ear normal.  Nose: Nose normal.  Mouth/Throat: Uvula is midline, oropharynx is clear and moist and mucous membranes are normal.       Right TM completely obstructed with cerumen, left ear partially obstructed  Eyes: Conjunctivae are normal. Pupils are equal, round, and reactive to light. No scleral icterus.  Neck: Trachea normal. Neck supple.  Cardiovascular: Normal rate,  regular rhythm and normal heart sounds.   Pulmonary/Chest: Effort normal and breath sounds normal.  Lymphadenopathy:       Head (right side): No preauricular, no posterior auricular and no occipital adenopathy present.       Head (left side): No preauricular, no posterior auricular and no occipital adenopathy present.    He has no cervical adenopathy.  Neurological: He is alert and oriented to person, place, and time. No cranial nerve deficit or sensory deficit.  Skin: Skin is warm and dry.  Psychiatric: He has a normal mood and affect. His speech is normal and behavior is normal. Judgment and thought content normal. Cognition and memory are normal.    ED Course  EAR CERUMEN REMOVAL, bilateral Date/Time: 01/23/2012 10:33 AM Performed by: Nigel Mormon, Sary Bogie L Authorized by: Johnsie Kindred Consent: Verbal consent obtained. Risks and benefits: risks, benefits and alternatives were discussed Consent given by: patient Patient understanding: patient states understanding of the procedure being performed Patient consent: the patient's understanding of the procedure matches consent given Procedure consent: procedure consent matches procedure scheduled Relevant documents: relevant documents present and verified Patient identity confirmed: verbally with patient Local anesthetic: none Patient sedated: no Patient tolerance: Patient tolerated the procedure well with no immediate complications.   (including critical care time)  Labs Reviewed - No data to display No results found.   1. Cerumen impaction   2. Conductive hearing loss       MDM  Cerumen removed with irrigation procedure, reports improved hearing in right ear.  Discussed warm water irrigations frequently to prevent cerumen build up along with OTC ear wax removal products.  If hearing does not return to your normal, follow up with ENT.        Johnsie Kindred, NP 01/23/12 1043

## 2012-01-24 NOTE — ED Provider Notes (Signed)
Agree with exam, assessment, and plan.   Lattie Haw, MD 01/24/12 [phone]

## 2012-02-10 ENCOUNTER — Emergency Department
Admission: EM | Admit: 2012-02-10 | Discharge: 2012-02-10 | Discharge status: Absent without leave | Payer: Medicare Other | Source: Home / Self Care

## 2012-05-18 ENCOUNTER — Emergency Department (HOSPITAL_COMMUNITY)
Admission: EM | Admit: 2012-05-18 | Discharge: 2012-05-18 | Disposition: A | Discharge status: Home / Self Care | Payer: Medicare Other | Attending: Emergency Medicine | Admitting: Emergency Medicine

## 2012-05-18 ENCOUNTER — Encounter (HOSPITAL_COMMUNITY): Payer: Self-pay

## 2012-05-18 ENCOUNTER — Emergency Department (HOSPITAL_COMMUNITY): Payer: Medicare Other

## 2012-05-18 ENCOUNTER — Other Ambulatory Visit: Payer: Self-pay

## 2012-05-18 ENCOUNTER — Telehealth: Payer: Self-pay | Admitting: Internal Medicine

## 2012-05-18 DIAGNOSIS — E119 Type 2 diabetes mellitus without complications: Secondary | ICD-10-CM | POA: Insufficient documentation

## 2012-05-18 DIAGNOSIS — G609 Hereditary and idiopathic neuropathy, unspecified: Secondary | ICD-10-CM | POA: Insufficient documentation

## 2012-05-18 DIAGNOSIS — E785 Hyperlipidemia, unspecified: Secondary | ICD-10-CM | POA: Insufficient documentation

## 2012-05-18 DIAGNOSIS — Z9861 Coronary angioplasty status: Secondary | ICD-10-CM | POA: Insufficient documentation

## 2012-05-18 DIAGNOSIS — I10 Essential (primary) hypertension: Secondary | ICD-10-CM | POA: Insufficient documentation

## 2012-05-18 DIAGNOSIS — K589 Irritable bowel syndrome without diarrhea: Secondary | ICD-10-CM | POA: Insufficient documentation

## 2012-05-18 DIAGNOSIS — G2581 Restless legs syndrome: Secondary | ICD-10-CM | POA: Insufficient documentation

## 2012-05-18 DIAGNOSIS — R0602 Shortness of breath: Secondary | ICD-10-CM | POA: Insufficient documentation

## 2012-05-18 DIAGNOSIS — K219 Gastro-esophageal reflux disease without esophagitis: Secondary | ICD-10-CM | POA: Insufficient documentation

## 2012-05-18 DIAGNOSIS — I2510 Atherosclerotic heart disease of native coronary artery without angina pectoris: Secondary | ICD-10-CM | POA: Insufficient documentation

## 2012-05-18 DIAGNOSIS — F3289 Other specified depressive episodes: Secondary | ICD-10-CM | POA: Insufficient documentation

## 2012-05-18 DIAGNOSIS — Z87891 Personal history of nicotine dependence: Secondary | ICD-10-CM | POA: Insufficient documentation

## 2012-05-18 DIAGNOSIS — Z85038 Personal history of other malignant neoplasm of large intestine: Secondary | ICD-10-CM | POA: Insufficient documentation

## 2012-05-18 DIAGNOSIS — F411 Generalized anxiety disorder: Secondary | ICD-10-CM | POA: Insufficient documentation

## 2012-05-18 DIAGNOSIS — I440 Atrioventricular block, first degree: Secondary | ICD-10-CM | POA: Insufficient documentation

## 2012-05-18 DIAGNOSIS — F329 Major depressive disorder, single episode, unspecified: Secondary | ICD-10-CM | POA: Insufficient documentation

## 2012-05-18 DIAGNOSIS — R0789 Other chest pain: Secondary | ICD-10-CM | POA: Insufficient documentation

## 2012-05-18 DIAGNOSIS — R079 Chest pain, unspecified: Secondary | ICD-10-CM

## 2012-05-18 LAB — COMPREHENSIVE METABOLIC PANEL
ALT: 13 U/L (ref 0–53)
AST: 21 U/L (ref 0–37)
Calcium: 9.3 mg/dL (ref 8.4–10.5)
Creatinine, Ser: 1.51 mg/dL — ABNORMAL HIGH (ref 0.50–1.35)
GFR calc Af Amer: 49 mL/min — ABNORMAL LOW (ref >90)
Sodium: 140 mEq/L (ref 135–145)
Total Protein: 6.5 g/dL (ref 6.0–8.3)

## 2012-05-18 LAB — CBC WITH DIFFERENTIAL/PLATELET
Basophils Absolute: 0 10*3/uL (ref 0.0–0.1)
Eosinophils Absolute: 0.2 10*3/uL (ref 0.0–0.7)
Eosinophils Relative: 4 % (ref 0–5)
MCH: 32.2 pg (ref 26.0–34.0)
MCHC: 35.6 g/dL (ref 30.0–36.0)
MCV: 90.4 fL (ref 78.0–100.0)
Monocytes Absolute: 0.4 10*3/uL (ref 0.1–1.0)
Platelets: 108 10*3/uL — ABNORMAL LOW (ref 150–400)
RDW: 12.8 % (ref 11.5–15.5)

## 2012-05-18 LAB — POCT I-STAT TROPONIN I

## 2012-05-18 LAB — D-DIMER, QUANTITATIVE: D-Dimer, Quant: 0.33 ug/mL-FEU (ref 0.00–0.48)

## 2012-05-18 LAB — MAGNESIUM: Magnesium: 2 mg/dL (ref 1.5–2.5)

## 2012-05-18 MED ORDER — ASPIRIN 81 MG PO CHEW
162.0000 mg | CHEWABLE_TABLET | Freq: Once | ORAL | Status: AC
  Administered 2012-05-18: 162 mg via ORAL
  Filled 2012-05-18: qty 2

## 2012-05-18 MED ORDER — NITROGLYCERIN 0.4 MG SL SUBL: 0.4000 mg | SUBLINGUAL_TABLET | SUBLINGUAL | Status: DC | PRN

## 2012-05-18 NOTE — ED Notes (Signed)
Pt reports he has been feeling SOB x 2 days.

## 2012-05-18 NOTE — Telephone Encounter (Signed)
Called left message to call back 

## 2012-05-18 NOTE — ED Notes (Addendum)
Pt reports he took 2 81mg  ASA at home before he came in.

## 2012-05-18 NOTE — Telephone Encounter (Signed)
THE PATIENT REFUSED 911: Caller: [name]/Patient; Patient Name: Alex Higgins; PCP: Oliver Barre (Adults only); Best Callback Phone Number: [phone]; Reason for call: chest pain; symptoms started "since forever;" but this week much worse that he has had; on 05/14/12 and 05/15/12 "I didn't think I was going to make it;" did not call MD at this time;  rates 8/10 at the beginning of the week; changed diet and now chest pain is rated 1/10; having pain in left arm now that started since beginning of the week;  feeling tired but can get up and walk; Triaged per Chest Pain Guideline; Call 911 now due to pressure or pain anywhere in chest lasting 5 or more minutes; refusing to call 911; states that wife will drive him to [facility] ED now; can not take aspirin due to ulcer history; will bring list of his meds to ED now; OFFICE PLEASE REVIEW FOR HEADS UP OF 911 DISPOSITION AND PT SENT TO [place] PER PRACTICE ORDERS

## 2012-05-18 NOTE — ED Provider Notes (Signed)
History     CSN: [id number]  Arrival date & time 05/18/12  1353   First MD Initiated Contact with Patient 05/18/12 1540      Chief Complaint  Patient presents with  . Chest Pain  . Shortness of Breath    (Consider location/radiation/quality/duration/timing/severity/associated sxs/prior treatment) Patient is a 78 y.o. male presenting with chest pain and shortness of breath. The history is provided by the patient and the spouse. No language interpreter was used.  Chest Pain The chest pain began 2 days ago. Chest pain occurs constantly. The chest pain is improving. The pain is associated with coughing. At its most intense, the pain is at 8/10. The pain is currently at 0/10. The severity of the pain is mild. The quality of the pain is described as pressure-like. The pain radiates to the left arm. Primary symptoms include fatigue, shortness of breath, cough and nausea. Pertinent negatives for primary symptoms include no fever, no syncope, no wheezing, no palpitations, no abdominal pain, no vomiting, no dizziness and no altered mental status.  Associated symptoms include weakness.  Pertinent negatives for associated symptoms include no lower extremity edema, no near-syncope and no numbness. He tried NSAIDs for the symptoms. Risk factors include male gender.  His past medical history is significant for cancer, diabetes, hyperlipidemia and hypertension.  Pertinent negatives for past medical history include no COPD, no CHF, no DVT, no MI, no PE, no recent injury, no seizures, no sleep apnea, no strokes and no TIA.  Procedure history is positive for cardiac catheterization and echocardiogram.    Shortness of Breath  Associated symptoms include chest pain, cough and shortness of breath. Pertinent negatives include no fever and no wheezing.   78 year old male in today with complaint of chest pain and shortness of breath intermittent x5 days. States that on Monday he was having left upper extremity  pain and nausea, sob and dizziness with the pain located below his left breast into his left ribs. The pain was not reproducible. The pain was constant and lasted 2 days. The pain waxed and waned anywhere from a 2/10-8/10. States that he had shortness of breath and dizziness with this pain. Pain is 1/10 presently.  Feels like i cant catch my breath.  Wife states that he has been "overdoing it" with his wood split. Does not appear SOB.   Patient took Aleve with mild improvement. States that he's also had a cough. Denies calf pain or any long trips. Last 2-D echo was in 10/12 and had an EF of 60-65%. Patient goes to the [facility] cardiology. pmh of cardiac stent and list below.    Past Medical History  Diagnosis Date  . CAD (coronary artery disease)   . Hypertension   . Hyperlipidemia   . Renal insufficiency   . Peripheral edema   . GERD (gastroesophageal reflux disease)   . Diabetes mellitus, type 2   . Nonspecific elevation of levels of transaminase or lactic acid dehydrogenase (LDH)   . Headache   . Incisional hernia   . Oral aphthae   . Anxiety and depression   . BPH (benign prostatic hyperplasia)   . IBS (irritable bowel syndrome)   . Hepatitis B, chronic   . Iron deficiency anemia   . Tubular adenoma of colon 10/1993  . Thrombocytopenia   . Hearing loss   . Diverticulosis   . Hemorrhoids   . Colon cancer 1985    arising from a rectal polyp  . Peripheral neuropathy 08/11/2011  .  RLS (restless legs syndrome) 08/11/2011  . Postural dizziness     Past Surgical History  Procedure Date  . Ptca     stents  . Appendectomy   . Tonsillectomy   . Hernia repair   . Cholecystectomy 01/2008  . Hand surgery     bilateral  . Intraocular lens insertion   . Neck surgery     gland removed    Family History  Problem Relation Age of Onset  . Heart disease Mother     Brothers x 2  . Diabetes    . Hypertension      History  Substance Use Topics  . Smoking status: Former Smoker     Types: Cigarettes    Quit date: 09/05/1958  . Smokeless tobacco: Never Used  . Alcohol Use: Yes     1 beer once a month      Review of Systems  Constitutional: Positive for fatigue. Negative for fever.  HENT: Negative.   Eyes: Negative.   Respiratory: Positive for cough and shortness of breath. Negative for wheezing.   Cardiovascular: Positive for chest pain. Negative for palpitations, leg swelling, syncope and near-syncope.  Gastrointestinal: Positive for nausea. Negative for vomiting and abdominal pain.  Genitourinary: Negative for flank pain and difficulty urinating.  Musculoskeletal: Negative for back pain and gait problem.  Neurological: Positive for weakness. Negative for dizziness, seizures, syncope, numbness and headaches.       General weakness  Psychiatric/Behavioral: Negative.  Negative for altered mental status.  All other systems reviewed and are negative.    Allergies  Dutasteride and Procaine hcl  Home Medications   Current Outpatient Rx  Name Route Sig Dispense Refill  . CITALOPRAM HYDROBROMIDE 20 MG PO TABS Oral Take 1 tablet (20 mg total) by mouth daily. 90 tablet 3  . EZETIMIBE 10 MG PO TABS Oral Take 1 tablet (10 mg total) by mouth daily. 90 tablet 3  . FINASTERIDE 5 MG PO TABS Oral Take 1 tablet (5 mg total) by mouth daily. 90 tablet 3  . OMEPRAZOLE 40 MG PO CPDR Oral Take 1 capsule (40 mg total) by mouth 2 (two) times daily. 180 capsule 3  . PRAVASTATIN SODIUM 80 MG PO TABS Oral Take 1 tablet (80 mg total) by mouth daily. 90 tablet 3    BP 160/65  Pulse 58  Temp 97.3 F (36.3 C) (Oral)  Resp 16  SpO2 98%  Physical Exam  Nursing note and vitals reviewed. Constitutional: He is oriented to person, place, and time. He appears well-developed and well-nourished.  HENT:  Head: Normocephalic.  Eyes: Conjunctivae normal and EOM are normal. Pupils are equal, round, and reactive to light.  Neck: Normal range of motion. Neck supple.  Cardiovascular:  Normal rate, regular rhythm, normal heart sounds and intact distal pulses.  Exam reveals no gallop and no friction rub.   No murmur heard. Pulmonary/Chest: Effort normal and breath sounds normal. No respiratory distress. He has no wheezes.  Abdominal: Soft. Bowel sounds are normal. He exhibits no distension. There is no tenderness.  Musculoskeletal: Normal range of motion.  Neurological: He is alert and oriented to person, place, and time.  Skin: Skin is warm and dry.  Psychiatric: He has a normal mood and affect.    ED Course  Procedures (including critical care time)   Mr Pursell is wanting to go home.  Wife and myself asking him to be patient and wait for cardiology tos see.  He does have prolonged QT and  1st degree heart block new on his ekg.   Labs Reviewed  CBC WITH DIFFERENTIAL - Abnormal; Notable for the following:    Platelets 108 (*)  PLATELET COUNT CONFIRMED BY SMEAR   All other components within normal limits  COMPREHENSIVE METABOLIC PANEL - Abnormal; Notable for the following:    Glucose, Bld 178 (*)     Creatinine, Ser 1.51 (*)     Total Bilirubin 1.9 (*)     GFR calc non Af Amer 42 (*)     GFR calc Af Amer 49 (*)     All other components within normal limits  POCT I-STAT TROPONIN I   Dg Chest 2 View  05/18/2012  *RADIOLOGY REPORT*  Clinical Data: Difficulty breathing, weakness  CHEST - 2 VIEW  Comparison: Chest x-ray of 05/11/2011  Findings: The lungs remain clear and somewhat hyperaerated.  A granuloma into the right lung base is stable.  No infiltrate or effusion is seen.  Mediastinal contours are stable.  The heart is within normal limits in size.  No bony abnormality is seen.  IMPRESSION: Stable chest x-ray.  No active lung disease.   Original Report Authenticated By: Juline Patch, M.D.      No diagnosis found.    MDM   Date: 05/18/2012  Rate: 99  Rhythm: normal sinus rhythm  QRS Axis: normal  Intervals: QT prolonged  ST/T Wave abnormalities: normal   Conduction Disutrbances:first-degree A-V block   Narrative Interpretation:   Old EKG Reviewed: changes noted  Patient is on celexa which can prolong QT interval Atypical chest pain that has been constant x 5 days waxing and waning with SOB.  D dimer -, trop -.  EKG with changes.  Chest x-ray unremarkable.  Non reproducible pan under L breast and L rib cage.    Normal echocardiogram in 12/12.  Spoke with Dr. Daleen Squibb and patient will be called Monday am for  an appointment.  Patient was given rx for ntg and instructed on how to use it.  Educated about angina pain as well.  Patient and wife agree with plan and are ready for discharge.  He will return for any severe pain, SOB or any concerns.          Remi Haggard, NP 05/18/12 1818

## 2012-05-18 NOTE — Telephone Encounter (Signed)
Ok for Aspirin - take 1 x 325 mg - chew and swallow with small amount water (dont worry about hx of ulcer)  Agree with need for 911 or go to ER with transport per wife

## 2012-05-18 NOTE — ED Notes (Signed)
Complains of orthostatic hypotension

## 2012-05-18 NOTE — ED Notes (Signed)
Pt complains of sob and cp. Onset Monday, nauseated. Some dizziness. Some improvement since then.

## 2012-05-18 NOTE — Telephone Encounter (Signed)
Called the patient and spoke to his wife..  They are at [facility] ER at this time waiting to be seen.  Informed to take ASA at this time.  The patient had some with her and did agree to give to the patient as instructed per MD.   Received phone call from [facility].  They came out to see the patient today and did a urine test on the patient and he had plus 4 glucose in his urine.

## 2012-05-18 NOTE — Telephone Encounter (Signed)
Since they are at the ER, they are liekly to have a serum blood glucose done, but please ask the pt to mention this to his MD when seen

## 2012-05-18 NOTE — ED Notes (Signed)
Pt reports he is ready to go and no longer having CP and doesn't feel SOB.

## 2012-05-19 NOTE — ED Provider Notes (Signed)
Medical screening examination/treatment/procedure(s) were conducted as a shared visit with non-physician practitioner(s) and myself.  I personally evaluated the patient during the encounter  Derwood Kaplan, MD 05/19/12 [phone]

## 2012-05-23 ENCOUNTER — Ambulatory Visit (INDEPENDENT_AMBULATORY_CARE_PROVIDER_SITE_OTHER): Payer: Medicare Other | Admitting: Cardiology

## 2012-05-23 ENCOUNTER — Encounter: Payer: Self-pay | Admitting: Cardiology

## 2012-05-23 VITALS — BP 124/70 | HR 72 | Ht 73.0 in | Wt 203.0 lb

## 2012-05-23 DIAGNOSIS — I2510 Atherosclerotic heart disease of native coronary artery without angina pectoris: Secondary | ICD-10-CM

## 2012-05-23 DIAGNOSIS — E119 Type 2 diabetes mellitus without complications: Secondary | ICD-10-CM

## 2012-05-23 DIAGNOSIS — E785 Hyperlipidemia, unspecified: Secondary | ICD-10-CM

## 2012-05-23 LAB — HEMOGLOBIN A1C: Hgb A1c MFr Bld: 7.1 % — ABNORMAL HIGH (ref 4.6–6.5)

## 2012-05-23 NOTE — Progress Notes (Signed)
HPI:  The patient is seen in the office after having been in the emergency room last Friday. He presented with some chest pain. The pain was located in the left inframammary area, sharp, intermittent. He and his wife differ slightly on the description of the pain. He perhaps was worse with a little bit of inspiration. There radiate somewhat down the left arm. There is no diaphoresis or nausea. There is not dramatic shortness of breath. His d-dimer was normal. His EKG showed no acute changes, and a followup EKG today likewise does not ST depression. He definitely wants to go back to work immediately. He is concerned that he may have some ulcers, he's had problems in the past and apparently Dr. Russella Dar placed him on twice a day omeprazole he denies any exertional symptoms. His pain in the past has been somewhat atypical when he had his percutaneous intervention, and the last catheterization in 2010 did not demonstrate significant restenosis.  Current Outpatient Prescriptions  Medication Sig Dispense Refill  . citalopram (CELEXA) 20 MG tablet Take 1 tablet (20 mg total) by mouth daily.  90 tablet  3  . ezetimibe (ZETIA) 10 MG tablet Take 1 tablet (10 mg total) by mouth daily.  90 tablet  3  . finasteride (PROSCAR) 5 MG tablet Take 1 tablet (5 mg total) by mouth daily.  90 tablet  3  . nitroGLYCERIN (NITROSTAT) 0.4 MG SL tablet Place 1 tablet (0.4 mg total) under the tongue every 5 (five) minutes as needed for chest pain.  30 tablet  0  . omeprazole (PRILOSEC) 40 MG capsule Take 1 capsule (40 mg total) by mouth 2 (two) times daily.  180 capsule  3  . pravastatin (PRAVACHOL) 80 MG tablet Take 1 tablet (80 mg total) by mouth daily.  90 tablet  3    Allergies  Allergen Reactions  . Dutasteride Other (See Comments)    unknown  . Procaine Hcl Other (See Comments)    unknown    Past Medical History  Diagnosis Date  . CAD (coronary artery disease)   . Hypertension   . Hyperlipidemia   . Renal  insufficiency   . Peripheral edema   . GERD (gastroesophageal reflux disease)   . Diabetes mellitus, type 2   . Nonspecific elevation of levels of transaminase or lactic acid dehydrogenase (LDH)   . Headache   . Incisional hernia   . Oral aphthae   . Anxiety and depression   . BPH (benign prostatic hyperplasia)   . IBS (irritable bowel syndrome)   . Hepatitis B, chronic   . Iron deficiency anemia   . Tubular adenoma of colon 10/1993  . Thrombocytopenia   . Hearing loss   . Diverticulosis   . Hemorrhoids   . Colon cancer 1985    arising from a rectal polyp  . Peripheral neuropathy 08/11/2011  . RLS (restless legs syndrome) 08/11/2011  . Postural dizziness     Past Surgical History  Procedure Date  . Ptca     stents  . Appendectomy   . Tonsillectomy   . Hernia repair   . Cholecystectomy 01/2008  . Hand surgery     bilateral  . Intraocular lens insertion   . Neck surgery     gland removed    Family History  Problem Relation Age of Onset  . Heart disease Mother     Brothers x 2  . Diabetes    . Hypertension      History  Social History  . Marital Status: Married    Spouse Name: N/A    Number of Children: 1  . Years of Education: N/A   Occupational History  . retired    Social History Main Topics  . Smoking status: Former Smoker    Types: Cigarettes    Quit date: 09/05/1958  . Smokeless tobacco: Never Used  . Alcohol Use: Yes     1 beer once a month  . Drug Use: No  . Sexually Active: Not on file   Other Topics Concern  . Not on file   Social History Narrative  . No narrative on file    ROS: Please see the HPI.  All other systems reviewed and negative.  PHYSICAL EXAM:  BP 124/70  Pulse 72  Ht 6\' 1"  (1.854 m)  Wt 203 lb (92.08 kg)  BMI 26.78 kg/m2  General: Well developed, well nourished, in no acute distress. Head:  Normocephalic and atraumatic. Neck: no JVD Lungs: Clear to auscultation and percussion. Heart: Normal S1 and S2.  No  murmur, rubs or gallops.  Abdomen:  Normal bowel sounds; soft; non tender; no organomegaly Pulses: Pulses normal in all 4 extremities. Extremities: No clubbing or cyanosis. No edema. Neurologic: Alert and oriented x 3.  EKG:  NSR.  Delay in  R wave progression.  No def anterior MI  (old).,  No acute ST or T changes.     Cath DATA 2010  Left main was normal.  LAD was a long vessel wrapping the apex, gave off a single diagonal.  There was a long tubular 40-50% lesion in the proximal to mid section of  the LAD spanning the diagonal. This was non-flow limiting.  Left circumflex was a large dominant vessel, gave off small ramus  branch, a large OM-1, a large posterolateral, and a PDA. There was a 20-  30% lesion in the mid AV groove circumflex. The stent in the distal  left circumflex into the PD was widely patent.  Right coronary is a small nondominant vessel with no high-grade lesions.  ASSESSMENT: Nonobstructive coronary artery disease with patent left  circumflex stent. The left anterior descending looked good.  PLAN/DISCUSSION: Given the results of his coronary angiography, I do  not think his chest pain is cardiac in nature. We will continue current  regimen.  Bevelyn Buckles. Bensimhon, MD  Electronically Signed  DRB/MEDQ D: 02/12/2009 T: 02/13/2009 Job: [id number]   ASSESSMENT AND PLAN:

## 2012-05-23 NOTE — Assessment & Plan Note (Signed)
His symptoms are somewhat atypical. I think the best strategy would be to proceed with a nuclear scan. This would also there is significant ischemia. He is concern we'll try to get this done as quickly as possible and get him back to see one of our extenders early next week. I will see him in followup in 2 weeks. ed, and wants to keep working.

## 2012-05-23 NOTE — Assessment & Plan Note (Signed)
Sugars been chronically elevated. I think it would be worthwhile for him to see Dr. Jonny Ruiz, and consider whether or not treatment should be considered. We will go ahead and get a hemoglobin A1c today. We will add troponin to this.

## 2012-05-23 NOTE — Patient Instructions (Addendum)
Your physician has requested that you have en exercise stress myoview. For further information please visit [email or web address] Please follow instruction sheet, as given.  Your physician recommends that you schedule a follow-up appointment in: 1 WEEK with PA/NP and 2 WEEKS with Dr Riley Kill  Please schedule the patient to see Dr Jonny Ruiz ASAP for evaluation of elevated glucose.   Your physician recommends that you have lab work today: Troponin and HgbA1c

## 2012-05-24 ENCOUNTER — Encounter: Payer: Self-pay | Admitting: Internal Medicine

## 2012-05-24 ENCOUNTER — Ambulatory Visit (INDEPENDENT_AMBULATORY_CARE_PROVIDER_SITE_OTHER): Payer: Medicare Other | Admitting: Internal Medicine

## 2012-05-24 VITALS — BP 110/62 | HR 59 | Temp 97.0°F | Ht 73.0 in | Wt 203.0 lb

## 2012-05-24 DIAGNOSIS — E785 Hyperlipidemia, unspecified: Secondary | ICD-10-CM

## 2012-05-24 DIAGNOSIS — E119 Type 2 diabetes mellitus without complications: Secondary | ICD-10-CM

## 2012-05-24 DIAGNOSIS — I10 Essential (primary) hypertension: Secondary | ICD-10-CM

## 2012-05-24 MED ORDER — LINAGLIPTIN 5 MG PO TABS: 5.0000 mg | ORAL_TABLET | Freq: Every day | ORAL | Status: DC

## 2012-05-24 NOTE — Patient Instructions (Addendum)
You had the flu shot today You are given the results of your A1c today No need for further blood work Take all new medications as prescribed - the Tradjenta 5 mg per day Continue all other medications as before Please keep your appointments with your specialists as you have planned - the stress test Monday You will be contacted regarding the referral for: Diabetes class

## 2012-05-24 NOTE — Assessment & Plan Note (Addendum)
Mild increased a1c, not clear a significant factor in recent syncope,as was minimally increased,  For tradjenta 5 qd,  To avoid metformin for now due to increased cr, and avoid sulfonylurea to avoid risk of low sugar, for Dm education referral as well

## 2012-05-24 NOTE — Assessment & Plan Note (Signed)
stable overall by hx and exam, most recent data reviewed with pt, and pt to continue medical treatment as before Lab Results  Component Value Date   LDLCALC 53 08/11/2011

## 2012-05-24 NOTE — Assessment & Plan Note (Signed)
stable overall by hx and exam, most recent data reviewed with pt, and pt to continue medical treatment as before BP Readings from Last 3 Encounters:  05/24/12 110/62  05/23/12 124/70  05/18/12 157/75

## 2012-05-24 NOTE — Progress Notes (Signed)
Subjective:    Patient ID: Alex Higgins, male    DOB: 1930/08/03, 78 y.o.   MRN: [id number]  HPI  Here to f/u; overall doing ok,  Pt denies chest pain, increased sob or doe, wheezing, orthopnea, PND, increased LE swelling, palpitations, dizziness or syncope.  Pt denies new neurological symptoms such as new headache, or facial or extremity weakness or numbness   Pt denies polydipsia, polyuria, or low sugar symptoms such as weakness or confusion improved with po intake.  Pt states overall good compliance with meds, trying to follow lower cholesterol, diabetic diet, wt overall stable,  Did have recent weakness with ? Of mild increaed glc recently possibly contributing.  Recent a1c 7.1, cr 1.5. Has stress test for next monday Past Medical History  Diagnosis Date  . CAD (coronary artery disease)   . Hypertension   . Hyperlipidemia   . Renal insufficiency   . Peripheral edema   . GERD (gastroesophageal reflux disease)   . Diabetes mellitus, type 2   . Nonspecific elevation of levels of transaminase or lactic acid dehydrogenase (LDH)   . Headache   . Incisional hernia   . Oral aphthae   . Anxiety and depression   . BPH (benign prostatic hyperplasia)   . IBS (irritable bowel syndrome)   . Hepatitis B, chronic   . Iron deficiency anemia   . Tubular adenoma of colon 10/1993  . Thrombocytopenia   . Hearing loss   . Diverticulosis   . Hemorrhoids   . Colon cancer 1985    arising from a rectal polyp  . Peripheral neuropathy 08/11/2011  . RLS (restless legs syndrome) 08/11/2011  . Postural dizziness    Past Surgical History  Procedure Date  . Ptca     stents  . Appendectomy   . Tonsillectomy   . Hernia repair   . Cholecystectomy 01/2008  . Hand surgery     bilateral  . Intraocular lens insertion   . Neck surgery     gland removed    reports that he quit smoking about 53 years ago. His smoking use included Cigarettes. He has never used smokeless tobacco. He reports that he drinks  alcohol. He reports that he does not use illicit drugs. family history includes Diabetes in an unspecified family member; Heart disease in his mother; and Hypertension in an unspecified family member. Allergies  Allergen Reactions  . Dutasteride Other (See Comments)    unknown  . Procaine Hcl Other (See Comments)    unknown   Current Outpatient Prescriptions on File Prior to Visit  Medication Sig Dispense Refill  . citalopram (CELEXA) 20 MG tablet Take 1 tablet (20 mg total) by mouth daily.  90 tablet  3  . ezetimibe (ZETIA) 10 MG tablet Take 1 tablet (10 mg total) by mouth daily.  90 tablet  3  . finasteride (PROSCAR) 5 MG tablet Take 1 tablet (5 mg total) by mouth daily.  90 tablet  3  . nitroGLYCERIN (NITROSTAT) 0.4 MG SL tablet Place 1 tablet (0.4 mg total) under the tongue every 5 (five) minutes as needed for chest pain.  30 tablet  0  . omeprazole (PRILOSEC) 40 MG capsule Take 1 capsule (40 mg total) by mouth 2 (two) times daily.  180 capsule  3  . pravastatin (PRAVACHOL) 80 MG tablet Take 1 tablet (80 mg total) by mouth daily.  90 tablet  3  . linagliptin (TRADJENTA) 5 MG TABS tablet Take 1 tablet (5 mg total) by  mouth daily.  90 tablet  3   Review of Systems  Constitutional: Negative for diaphoresis and unexpected weight change.  HENT: Negative for tinnitus.   Eyes: Negative for photophobia and visual disturbance.  Respiratory: Negative for choking and stridor.   Gastrointestinal: Negative for vomiting and blood in stool.  Genitourinary: Negative for hematuria and decreased urine volume.  Musculoskeletal: Negative for gait problem.  Skin: Negative for color change and wound.  Neurological: Negative for tremors and numbness.  Psychiatric/Behavioral: Negative for decreased concentration. The patient is not hyperactive.       Objective:   Physical Exam BP 110/62  Pulse 59  Temp 97 F (36.1 C) (Oral)  Ht 6\' 1"  (1.854 m)  Wt 203 lb (92.08 kg)  BMI 26.78 kg/m2  SpO2  95% Physical Exam  VS noted Constitutional: Pt appears well-developed and well-nourished.  HENT: Head: Normocephalic.  Right Ear: External ear normal.  Left Ear: External ear normal.  Eyes: Conjunctivae and EOM are normal. Pupils are equal, round, and reactive to light.  Neck: Normal range of motion. Neck supple.  Cardiovascular: Normal rate and regular rhythm.   Pulmonary/Chest: Effort normal and breath sounds normal.  Abd:  Soft, NT, non-distended, + BS Neurological: Pt is alert. Not confused  Skin: Skin is warm. No erythema.  Psychiatric: Pt behavior is normal. Thought content normal. 1-2+ nervous    Assessment & Plan:

## 2012-05-27 NOTE — Assessment & Plan Note (Signed)
Last LDL was at target.   

## 2012-05-28 ENCOUNTER — Ambulatory Visit (HOSPITAL_COMMUNITY): Payer: Medicare Other | Attending: Cardiology | Admitting: Radiology

## 2012-05-28 VITALS — BP 114/79 | HR 60 | Ht 73.0 in | Wt 199.0 lb

## 2012-05-28 DIAGNOSIS — I10 Essential (primary) hypertension: Secondary | ICD-10-CM | POA: Insufficient documentation

## 2012-05-28 DIAGNOSIS — R0989 Other specified symptoms and signs involving the circulatory and respiratory systems: Secondary | ICD-10-CM | POA: Insufficient documentation

## 2012-05-28 DIAGNOSIS — R42 Dizziness and giddiness: Secondary | ICD-10-CM | POA: Insufficient documentation

## 2012-05-28 DIAGNOSIS — I2510 Atherosclerotic heart disease of native coronary artery without angina pectoris: Secondary | ICD-10-CM | POA: Insufficient documentation

## 2012-05-28 DIAGNOSIS — R0609 Other forms of dyspnea: Secondary | ICD-10-CM | POA: Insufficient documentation

## 2012-05-28 DIAGNOSIS — R0602 Shortness of breath: Secondary | ICD-10-CM

## 2012-05-28 DIAGNOSIS — R079 Chest pain, unspecified: Secondary | ICD-10-CM | POA: Insufficient documentation

## 2012-05-28 DIAGNOSIS — E119 Type 2 diabetes mellitus without complications: Secondary | ICD-10-CM

## 2012-05-28 MED ORDER — TECHNETIUM TC 99M SESTAMIBI GENERIC - CARDIOLITE
33.0000 | Freq: Once | INTRAVENOUS | Status: AC | PRN
  Administered 2012-05-28: 33 via INTRAVENOUS

## 2012-05-28 MED ORDER — TECHNETIUM TC 99M SESTAMIBI GENERIC - CARDIOLITE
11.0000 | Freq: Once | INTRAVENOUS | Status: AC | PRN
  Administered 2012-05-28: 11 via INTRAVENOUS

## 2012-05-28 NOTE — Progress Notes (Addendum)
[facility] SITE 3 NUCLEAR MED [address] [place] [address] [phone]  Cardiology Nuclear Med Study  Alex Higgins is a 78 y.o. male     MRN : [id number]     DOB: 10-Nov-1929  Procedure Date: 05/28/2012  Nuclear Med Background Indication for Stress Test:  Evaluation for Ischemia, Stent Patency and Post MCER with CP, (-) enzymes History:  '01 Stent-CFX; '10 Cath:Patent stent, n/o CAD, patient Stent; '12 Echo:EF=65% Cardiac Risk Factors: Carotid Disease, Family History - CAD, History of Smoking, Hypertension, Lipids and NIDDM  Symptoms:  Chest Pain with and without Exertion (last episode of chest discomfort was about 2-days ago), Dizziness, DOE/SOB, Fatigue with and without Exertion, Light-Headedness, Nausea and Syncope   Nuclear Pre-Procedure Caffeine/Decaff Intake:  None NPO After: 8:00pm   Lungs:  Clear. O2 Sat: 95% on room air. IV 0.9% NS with Angio Cath:  20g  IV Site: R Hand  IV Started by:  Cathlyn Parsons, RN  Chest Size (in):  44 Cup Size: n/a  Height: 6\' 1"  (1.854 m)  Weight:  199 lb (90.266 kg)  BMI:  Body mass index is 26.25 kg/(m^2). Tech Comments:  n/a    Nuclear Med Study 1 or 2 day study: 1 day  Stress Test Type:  Stress  Reading MD: Cassell Clement, MD  Order Authorizing Provider:  Shawnie Pons, MD  Resting Radionuclide: Technetium 47m Sestamibi  Resting Radionuclide Dose: 10.9 mCi   Stress Radionuclide:  Technetium 57m Sestamibi  Stress Radionuclide Dose: 33.0 mCi           Stress Protocol Rest HR: 60 Stress HR: 127  Rest BP: Sitting 114/79  Standing 87/58 Stress BP: 154/49  Exercise Time (min): 5:00 METS: 6.5   Predicted Max HR: 141 bpm % Max HR: 90.07 bpm Rate Pressure Product: 21308   Dose of Adenosine (mg):  n/a Dose of Lexiscan: n/a mg  Dose of Atropine (mg): n/a Dose of Dobutamine: n/a   Stress Test Technologist: Smiley Houseman, CMA-N  Nuclear Technologist:  Domenic Polite, CNMT     Rest Procedure:   Myocardial perfusion imaging was performed at rest 45 minutes following the intravenous administration of Technetium 55m Sestamibi.  Rest ECG: Possible prior AWMI.  Stress Procedure:  The patient exercised on the treadmill utilizing the Bruce protocol for five minutes. He then stopped due to fatigue.  He did c/o chest pain, 4/10, with exercise; that went away in recovery.  There were no diagnostic ST-T wave changes, occasional PVC's were noted.  Technetium 12m Sestamibi was injected at peak exercise and myocardial perfusion imaging was performed after a brief delay.  Stress ECG: No significant ST segment change suggestive of ischemia.  QPS Raw Data Images:  Normal; no motion artifact; normal heart/lung ratio. Stress Images:  Normal homogeneous uptake in all areas of the myocardium. Rest Images:  Comparison with the stress images reveals no significant change. Diaphragmatic attenuation of inferior wall. Subtraction (SDS):  No evidence of ischemia. Transient Ischemic Dilatation (Normal <1.22):  0.77 Lung/Heart Ratio (Normal <0.45):  0.29  Quantitative Gated Spect Images QGS EDV:  56 ml QGS ESV:  17 ml  Impression Exercise Capacity:  Fair exercise capacity. BP Response:  Normal blood pressure response. Clinical Symptoms:  Mild chest pain/dyspnea. ECG Impression:  No significant ST segment change suggestive of ischemia. Comparison with Prior Nuclear Study: No previous nuclear study performed  Overall Impression:  Normal stress nuclear study.  LV Ejection Fraction: 69%.  LV Wall Motion:  NL LV Function; NL Wall Motion  [employer]

## 2012-05-31 ENCOUNTER — Encounter: Payer: Self-pay | Admitting: Nurse Practitioner

## 2012-05-31 ENCOUNTER — Ambulatory Visit (INDEPENDENT_AMBULATORY_CARE_PROVIDER_SITE_OTHER): Payer: Medicare Other | Admitting: Nurse Practitioner

## 2012-05-31 VITALS — BP 117/65 | HR 64 | Ht 73.0 in | Wt 201.0 lb

## 2012-05-31 DIAGNOSIS — I951 Orthostatic hypotension: Secondary | ICD-10-CM

## 2012-05-31 NOTE — Patient Instructions (Signed)
I do recommend support stockings  Try to increase your salt - have Gatorade in the place of your [facility]  Try to check some blood pressures at home  Dr. Riley Kill will see you next week  Call the [facility] Care office at [phone] if you have any questions, problems or concerns.

## 2012-05-31 NOTE — Progress Notes (Signed)
Alex Higgins Date of Birth: 1930-06-29 Medical Record #[id number]  History of Present Illness: Alex Higgins is seen today for a follow up visit. He is seen for Dr. Riley Kill. He has a history of CAD with past PCI, last cath in 2010 and recent Myoview testing for atypical chest pain. He was seen by Dr. Riley Kill last week after being in the ER for atypical chest pain. His other problems include HLD, BPH and GERD.   He comes in today. He is here with his wife. He is doing ok. He had his Myoview which was ok. Was noted to be orthostatic and was brought back in for early follow up. He says he feels just fine. He will get a little "cloudy" in his head with standing up. No more chest pain. Seeing Dr. Russella Dar next week for his GI follow up. He is not on any blood pressure medicine. He says he is not interested in using support stockings, says that's "too confining". Does not use much salt. Says Dr. Riley Kill has suggested this in the past. He admits that he is pretty stubborn.  Current Outpatient Prescriptions on File Prior to Visit  Medication Sig Dispense Refill  . citalopram (CELEXA) 20 MG tablet Take 1 tablet (20 mg total) by mouth daily.  90 tablet  3  . ezetimibe (ZETIA) 10 MG tablet Take 1 tablet (10 mg total) by mouth daily.  90 tablet  3  . finasteride (PROSCAR) 5 MG tablet Take 1 tablet (5 mg total) by mouth daily.  90 tablet  3  . linagliptin (TRADJENTA) 5 MG TABS tablet Take 1 tablet (5 mg total) by mouth daily.  90 tablet  3  . nitroGLYCERIN (NITROSTAT) 0.4 MG SL tablet Place 1 tablet (0.4 mg total) under the tongue every 5 (five) minutes as needed for chest pain.  30 tablet  0  . omeprazole (PRILOSEC) 40 MG capsule Take 1 capsule (40 mg total) by mouth 2 (two) times daily.  180 capsule  3  . pravastatin (PRAVACHOL) 80 MG tablet Take 1 tablet (80 mg total) by mouth daily.  90 tablet  3    Allergies  Allergen Reactions  . Dutasteride Other (See Comments)    unknown  . Procaine Hcl Other  (See Comments)    unknown    Past Medical History  Diagnosis Date  . CAD (coronary artery disease)   . Hypertension   . Hyperlipidemia   . Renal insufficiency   . Peripheral edema   . GERD (gastroesophageal reflux disease)   . Diabetes mellitus, type 2   . Nonspecific elevation of levels of transaminase or lactic acid dehydrogenase (LDH)   . Headache   . Incisional hernia   . Oral aphthae   . Anxiety and depression   . BPH (benign prostatic hyperplasia)   . IBS (irritable bowel syndrome)   . Hepatitis B, chronic   . Iron deficiency anemia   . Tubular adenoma of colon 10/1993  . Thrombocytopenia   . Hearing loss   . Diverticulosis   . Hemorrhoids   . Colon cancer 1985    arising from a rectal polyp  . Peripheral neuropathy 08/11/2011  . RLS (restless legs syndrome) 08/11/2011  . Postural dizziness     Past Surgical History  Procedure Date  . Ptca     stents  . Appendectomy   . Tonsillectomy   . Hernia repair   . Cholecystectomy 01/2008  . Hand surgery     bilateral  .  Intraocular lens insertion   . Neck surgery     gland removed    History  Smoking status  . Former Smoker  . Types: Cigarettes  . Quit date: 09/05/1958  Smokeless tobacco  . Never Used    History  Alcohol Use  . Yes    1 beer once a month    Family History  Problem Relation Age of Onset  . Heart disease Mother     Brothers x 2  . Diabetes    . Hypertension      Review of Systems: The review of systems is per the HPI.  All other systems were reviewed and are negative.  Physical Exam: BP 117/65  Pulse 64  Ht 6\' 1"  (1.854 m)  Wt 201 lb (91.173 kg)  BMI 26.52 kg/m2  Lying BP was 119/68 HR 63; Sitting BP 112/62 HR 63; Standing BP 92/50 HR 74; 2 minutes standing BP 106/58 HR 74; 5 minutes standing BP 98/58 HR 74.  Patient is pleasant and in no acute distress. Skin is warm and dry. Color is normal.  HEENT is unremarkable. Normocephalic/atraumatic. PERRL. Sclera are nonicteric. Neck  is supple. No masses. No JVD. Lungs are clear. Cardiac exam shows a regular rate and rhythm. Abdomen is soft. Extremities are without edema. Gait and ROM are intact. No gross neurologic deficits noted.   LABORATORY DATA: Lab Results  Component Value Date   WBC 4.8 05/18/2012   HGB 14.4 05/18/2012   HCT 40.4 05/18/2012   PLT 108* 05/18/2012   GLUCOSE 178* 05/18/2012   CHOL 108 08/11/2011   TRIG 131.0 08/11/2011   HDL 28.40* 08/11/2011   LDLDIRECT 74.8 06/08/2010   LDLCALC 53 08/11/2011   ALT 13 05/18/2012   AST 21 05/18/2012   NA 140 05/18/2012   K 4.7 05/18/2012   CL 103 05/18/2012   CREATININE 1.51* 05/18/2012   BUN 17 05/18/2012   CO2 30 05/18/2012   TSH 0.59 08/11/2011   PSA 0.94 08/11/2011   INR 1.0 ratio 11/30/2009   HGBA1C 7.1* 05/23/2012   MICROALBUR 0.7 08/11/2011   Myoview Overall Impression:   Normal stress nuclear study.  LV Ejection Fraction: 69%. LV Wall Motion: NL LV Function; NL Wall Motion   Alex Higgins    Assessment / Plan: 1. Orthostatic hypotension - I have tried to emphasize the importance of treating this issue to prevent fall and possible injury. I have suggested that he increase his salt intake some. I have also suggested support stockings. His wife admits that he does not stay well hydrated. He is quite resistant to make changes but will "think about it".   2. Atypical CP - negative Myoview. No further symptoms and he is seeing GI next week.   He sees Dr. Riley Kill next week. Patient is agreeable to this plan and will call if any problems develop in the interim.

## 2012-06-06 ENCOUNTER — Encounter: Payer: Self-pay | Admitting: Cardiology

## 2012-06-06 ENCOUNTER — Ambulatory Visit (INDEPENDENT_AMBULATORY_CARE_PROVIDER_SITE_OTHER): Payer: Medicare Other | Admitting: Cardiology

## 2012-06-06 VITALS — BP 109/60 | HR 60 | Ht 73.0 in | Wt 204.0 lb

## 2012-06-06 DIAGNOSIS — I951 Orthostatic hypotension: Secondary | ICD-10-CM

## 2012-06-06 DIAGNOSIS — I10 Essential (primary) hypertension: Secondary | ICD-10-CM

## 2012-06-06 DIAGNOSIS — R55 Syncope and collapse: Secondary | ICD-10-CM

## 2012-06-06 DIAGNOSIS — I2510 Atherosclerotic heart disease of native coronary artery without angina pectoris: Secondary | ICD-10-CM

## 2012-06-06 DIAGNOSIS — E785 Hyperlipidemia, unspecified: Secondary | ICD-10-CM

## 2012-06-06 NOTE — Progress Notes (Signed)
HPI:  Patient seen in followup. Overall he is stable he still gets some upper left chest pain. His myocardial perfusion imaging study did not reveal a perfusion defect, and his EKG was not associated with ST depression he did have 4/10 chest pain. He does notice some of this. In addition, he saw his primary care physician who started him on diabetic medication. Importantly, the patient has a substantial drop in blood pressure when he stands, and is previously seeing electrophysiologist. They recommended consideration of Florinef if it became severe enough. His wife notes that occasionally when he stands up, he will become glassy eye and it will take a little bit of time for him to recover.    Current Outpatient Prescriptions  Medication Sig Dispense Refill  . citalopram (CELEXA) 20 MG tablet Take 1 tablet (20 mg total) by mouth daily.  90 tablet  3  . ezetimibe (ZETIA) 10 MG tablet Take 1 tablet (10 mg total) by mouth daily.  90 tablet  3  . finasteride (PROSCAR) 5 MG tablet Take 1 tablet (5 mg total) by mouth daily.  90 tablet  3  . linagliptin (TRADJENTA) 5 MG TABS tablet Take 1 tablet (5 mg total) by mouth daily.  90 tablet  3  . nitroGLYCERIN (NITROSTAT) 0.4 MG SL tablet Place 1 tablet (0.4 mg total) under the tongue every 5 (five) minutes as needed for chest pain.  30 tablet  0  . omeprazole (PRILOSEC) 40 MG capsule Take 1 capsule (40 mg total) by mouth 2 (two) times daily.  180 capsule  3  . pravastatin (PRAVACHOL) 80 MG tablet Take 1 tablet (80 mg total) by mouth daily.  90 tablet  3    Allergies  Allergen Reactions  . Dutasteride Other (See Comments)    unknown  . Procaine Hcl Other (See Comments)    unknown    Past Medical History  Diagnosis Date  . CAD (coronary artery disease)   . Hypertension   . Hyperlipidemia   . Renal insufficiency   . Peripheral edema   . GERD (gastroesophageal reflux disease)   . Diabetes mellitus, type 2   . Nonspecific elevation of levels of  transaminase or lactic acid dehydrogenase (LDH)   . Headache   . Incisional hernia   . Oral aphthae   . Anxiety and depression   . BPH (benign prostatic hyperplasia)   . IBS (irritable bowel syndrome)   . Hepatitis B, chronic   . Iron deficiency anemia   . Tubular adenoma of colon 10/1993  . Thrombocytopenia   . Hearing loss   . Diverticulosis   . Hemorrhoids   . Colon cancer 1985    arising from a rectal polyp  . Peripheral neuropathy 08/11/2011  . RLS (restless legs syndrome) 08/11/2011  . Postural dizziness     Past Surgical History  Procedure Date  . Ptca     stents  . Appendectomy   . Tonsillectomy   . Hernia repair   . Cholecystectomy 01/2008  . Hand surgery     bilateral  . Intraocular lens insertion   . Neck surgery     gland removed    Family History  Problem Relation Age of Onset  . Heart disease Mother     Brothers x 2  . Diabetes    . Hypertension      History   Social History  . Marital Status: Married    Spouse Name: N/A    Number of Children:  1  . Years of Education: N/A   Occupational History  . retired    Social History Main Topics  . Smoking status: Former Smoker    Types: Cigarettes    Quit date: 09/05/1958  . Smokeless tobacco: Never Used  . Alcohol Use: Yes     1 beer once a month  . Drug Use: No  . Sexually Active: Not Currently   Other Topics Concern  . Not on file   Social History Narrative  . No narrative on file    ROS: Please see the HPI.  All other systems reviewed and negative.  PHYSICAL EXAM:  BP 109/60  Pulse 60  Ht 6\' 1"  (1.854 m)  Wt 204 lb (92.534 kg)  BMI 26.91 kg/m2  140 supine and 100 with standing.    General: Well developed, well nourished, in no acute distress. Head:  Normocephalic and atraumatic. Neck: no JVD Lungs: Clear to auscultation and percussion. Heart: Normal S1 and S2.  No murmur, rubs or gallops.  Abdomen:  Normal bowel sounds; soft; non tender; no organomegaly Pulses: Pulses  normal in all 4 extremities. Extremities: No clubbing or cyanosis. No edema. Neurologic: Alert and oriented x 3.  EKG:  ASSESSMENT AND PLAN:

## 2012-06-06 NOTE — Assessment & Plan Note (Signed)
Patient's chest pain is somewhat atypical. His myocardial perfusion images were unremarkable. He had no exercise induced ST depression. He did however have some chest pain we will see him back in one month's time, and he continues to have symptoms it may be necessary to go ahead and do coronary angiogram to clear the air.

## 2012-06-06 NOTE — Patient Instructions (Addendum)
Your physician recommends that you schedule a follow-up appointment in: 4 WEEKS  Your physician recommends that you have lab work today: [facility]  Your physician recommends that you continue on your current medications as directed. Please refer to the Current Medication list given to you today.

## 2012-06-06 NOTE — Assessment & Plan Note (Signed)
This clearly is in part responsible for his presyncope. Importantly, the patient is a substantial drop with standing this is exacerbated by his diabetes, and also by his proclivity not to drink fluids. He also drinks some caffeinated beverages in the morning. I counseled against this, and we have tried to liberalize his salt as an alternative to giving him Florinef.

## 2012-06-07 LAB — BASIC METABOLIC PANEL
CO2: 29 mEq/L (ref 19–32)
Calcium: 9.1 mg/dL (ref 8.4–10.5)
Creatinine, Ser: 1.7 mg/dL — ABNORMAL HIGH (ref 0.4–1.5)
GFR: 40.87 mL/min — ABNORMAL LOW (ref >60.00)
Glucose, Bld: 134 mg/dL — ABNORMAL HIGH (ref 70–99)
Sodium: 141 mEq/L (ref 135–145)

## 2012-06-12 ENCOUNTER — Ambulatory Visit (INDEPENDENT_AMBULATORY_CARE_PROVIDER_SITE_OTHER): Payer: Medicare Other | Admitting: Gastroenterology

## 2012-06-12 ENCOUNTER — Encounter: Payer: Self-pay | Admitting: Gastroenterology

## 2012-06-12 VITALS — BP 90/44 | HR 60 | Ht 73.0 in | Wt 202.4 lb

## 2012-06-12 DIAGNOSIS — K589 Irritable bowel syndrome without diarrhea: Secondary | ICD-10-CM

## 2012-06-12 DIAGNOSIS — R109 Unspecified abdominal pain: Secondary | ICD-10-CM

## 2012-06-12 DIAGNOSIS — K219 Gastro-esophageal reflux disease without esophagitis: Secondary | ICD-10-CM

## 2012-06-12 MED ORDER — GLYCOPYRROLATE 2 MG PO TABS: 2.0000 mg | ORAL_TABLET | Freq: Two times a day (BID) | ORAL | Status: DC

## 2012-06-12 NOTE — Patient Instructions (Addendum)
You have been scheduled for a CT scan of the abdomen and pelvis at [facility] ([address]---this is in the same building as Architectural technologist).   You are scheduled on 06/13/12 at 9:00am. You should arrive 15 minutes prior to your appointment time for registration. Please follow the written instructions below on the day of your exam:  WARNING: IF YOU ARE ALLERGIC TO IODINE/X-RAY DYE, PLEASE NOTIFY RADIOLOGY IMMEDIATELY AT [phone]! YOU WILL BE GIVEN A 13 HOUR PREMEDICATION PREP.  1) Do not eat or drink anything after 5:00am (4 hours prior to your test) 2) You have been given 2 bottles of oral contrast to drink. The solution may taste better if refrigerated, but do NOT add ice or any other liquid to this solution. Shake well before drinking.    Drink 1 bottle of contrast @ 7:00am (2 hours prior to your exam)  Drink 1 bottle of contrast @ 8:00am (1 hour prior to your exam)  You may take any medications as prescribed with a small amount of water except for the following: Metformin, Glucophage, Glucovance, Avandamet, Riomet, Fortamet, Actoplus Met, Janumet, Glumetza or Metaglip. The above medications must be held the day of the exam AND 48 hours after the exam.  The purpose of you drinking the oral contrast is to aid in the visualization of your intestinal tract. The contrast solution may cause some diarrhea. Before your exam is started, you will be given a small amount of fluid to drink. Depending on your individual set of symptoms, you may also receive an intravenous injection of x-ray contrast/dye. Plan on being at [facility] for 30 minutes or long, depending on the type of exam you are having performed.  If you have any questions regarding your exam or if you need to reschedule, you may call the CT department at [phone] between the hours of 8:00 am and 5:00 pm, Monday-Friday.  ________________________________________________________________________  We have sent  the following medications to your pharmacy for you to pick up at your convenience: Robinul forte.  We have given you samples of Align. This puts good bacteria back into your colon. You should take 1 capsule by mouth once daily. If this works well for you, it can be purchased over the counter.   cc: Oliver Barre, MD

## 2012-06-12 NOTE — Progress Notes (Signed)
History of Present Illness: This is a 78 year old male with chronic abdominal pain secondary to right lower quadrant adhesions and irritable bowel syndrome. He also has chronic GERD with occasional breakthrough symptoms frequently leading to chest pain. He has alternating diarrhea and constipation which is irritable bowel syndrome which has also been a chronic problem. He  was previously treated with glycopyrrolate but has not been on this medication for several years. He takes omeprazole 40 mg twice daily for reflux. He has previously undergone upper endoscopies in 1989, 1997, 2000, 2005 and 2009. His last colonoscopy was in July 2012 and prior to that in 2009 and 2006. His last CT scan of the abdomen/pelvis was in March 2011 and that was unremarkable. He was recently evaluated by Dr. Riley Kill and by Dr. Jonny Ruiz for orthostatic hypotension and other chronic medical problems. I reviewed their notes. I reviewed his recent blood work. Denies weight loss, change in stool caliber, melena, hematochezia, nausea, vomiting, dysphagia.  Current Medications, Allergies, Past Medical History, Past Surgical History, Family History and Social History were reviewed in [employer] record.  Physical Exam: General: Well developed , well nourished, no acute distress Head: Normocephalic and atraumatic Eyes:  sclerae anicteric, EOMI Ears: Normal auditory acuity Mouth: No deformity or lesions Lungs: Clear throughout to auscultation Heart: Regular rate and rhythm; no murmurs, rubs or bruits Abdomen: Soft, mild lower abdominal tenderness to deep palpation without rebound or guarding moderate tenderness above his right lower quadrant incision and non distended. No masses, hepatosplenomegaly or hernias noted. Normal Bowel sounds Musculoskeletal: Symmetrical with no gross deformities  Pulses:  Normal pulses noted Extremities: No clubbing, cyanosis, edema or deformities noted Neurological: Alert oriented x 4,  grossly nonfocal Psychological:  Alert and cooperative. Normal mood and affect  Assessment and Recommendations:  1. Chronic abdominal pain secondary to right lower quadrant adhesions and irritable bowel syndrome. These complaints have been long-term problems and they have been thoroughly evaluated in the past. He is reassured that he has had a thorough evaluation and that these symptoms will likely be chronic and not fully treatable. He does not seem to recall that we had discussed these issues multiple times in the past. He does not seem to recall taking medications for irritable bowel syndrome. Schedule CT scan of the abdomen/pelvis to rule out other causes of abdominal pain. Dietary advice was discussed for long-term management of irritable bowel syndrome. Resume glycopyrrolate 2 mg twice a day and begin a daily probiotic.   2. GERD with occasional chest pain. Follow all standard antireflux instructions and continue omeprazole 40 mg twice. May use TUMS when necessary. I do not feel is necessary to repeat his upper endoscopy at this time.  3. Personal history of colon cancer arising in colon polyp. Last colonoscopy July 2012. No further plans for surveillance colonoscopy given his age.Marland Kitchen

## 2012-06-13 ENCOUNTER — Ambulatory Visit (INDEPENDENT_AMBULATORY_CARE_PROVIDER_SITE_OTHER)
Admission: RE | Admit: 2012-06-13 | Discharge: 2012-06-13 | Disposition: A | Discharge status: Home / Self Care | Payer: Medicare Other | Source: Ambulatory Visit | Attending: Gastroenterology | Admitting: Gastroenterology

## 2012-06-13 ENCOUNTER — Other Ambulatory Visit: Payer: Self-pay

## 2012-06-13 ENCOUNTER — Other Ambulatory Visit (INDEPENDENT_AMBULATORY_CARE_PROVIDER_SITE_OTHER): Payer: Medicare Other

## 2012-06-13 DIAGNOSIS — R109 Unspecified abdominal pain: Secondary | ICD-10-CM

## 2012-06-13 DIAGNOSIS — K7460 Unspecified cirrhosis of liver: Secondary | ICD-10-CM

## 2012-06-13 LAB — FERRITIN: Ferritin: 110.2 ng/mL (ref 22.0–322.0)

## 2012-06-13 LAB — IBC PANEL
Iron: 79 ug/dL (ref 42–165)
Saturation Ratios: 28.9 % (ref 20.0–50.0)
Transferrin: 195.5 mg/dL — ABNORMAL LOW (ref 212.0–360.0)

## 2012-06-13 MED ORDER — IOHEXOL 300 MG/ML  SOLN
80.0000 mL | Freq: Once | INTRAMUSCULAR | Status: AC | PRN
  Administered 2012-06-13: 80 mL via INTRAVENOUS

## 2012-06-14 LAB — CERULOPLASMIN: Ceruloplasmin: 16 mg/dL — ABNORMAL LOW (ref 20–60)

## 2012-06-14 LAB — ALPHA-1-ANTITRYPSIN: A-1 Antitrypsin, Ser: 127 mg/dL (ref 90–200)

## 2012-06-17 NOTE — Assessment & Plan Note (Signed)
The patient is at target on treatment standpoint.

## 2012-06-17 NOTE — Assessment & Plan Note (Signed)
His BP is modestly elevated when supine, but does drop with standing.

## 2012-06-20 ENCOUNTER — Encounter: Payer: Medicare Other | Attending: Internal Medicine | Admitting: Dietician

## 2012-06-20 ENCOUNTER — Encounter: Payer: Self-pay | Admitting: Dietician

## 2012-06-20 VITALS — Ht 73.0 in | Wt 202.9 lb

## 2012-06-20 DIAGNOSIS — Z713 Dietary counseling and surveillance: Secondary | ICD-10-CM | POA: Insufficient documentation

## 2012-06-20 DIAGNOSIS — E119 Type 2 diabetes mellitus without complications: Secondary | ICD-10-CM | POA: Insufficient documentation

## 2012-06-20 NOTE — Progress Notes (Signed)
Medical Nutrition Therapy:  Appt start time: 1050 end time:  1220.   Assessment:  Primary concerns today: Wants to know more about food, because I'm not going to take any more pills. He and his wife report that he has decreased his portion size of breads and biscuits and gravy, that he is exercising more instead of taking the pills.  He declares that he has more energy, has not had any dizzy spells and muscle aches/weakness since he stopped all of his pill.  Notes "I feel much better."  He has as a goal to control his blood glucose using diet, exercise and weight loss and not use medication.  BLOOD GLUCOSE MONITORING: Not currently monitoring blood glucose.  HYPERGLYCEMIA:  He pointed out that when he was on his medications, he had the blurred vision, the sleepiness, and had a loss of appetite.  HYPOGLYCEMIA:  He pointed out that on his medications, he often had the dizzy, nervous, shaky, tired, light headed feelings. (used the hypoglycemia/hyperglycemia  picture gram)  FOOT SELF-EXAMINATION:  Not currently doing.  Reviewed the process and the need for daily self-examination.  DILATED EYE EXAMINATION:  Has not had an exam in the last 2 years according to his wife.  Encouraged him to have a baseline exam in the next few weeks.  MEDICATIONS: Stopped all medications except Prilosec for the stomach.   DIETARY INTAKE:  Usual eating pattern includes 3 meals and no snacks per day.   24-hr recall:  B ( AM): 8:00 fruit cup ( 1/2 cup, no sugar added,)and slice of toast (white bread) or biscuit with butter on them, coffee with creammora, and no sugar. Cookies 2 chocolate chip.  (small) Snk ( AM): none  L (PM) :00 sandwich (2 breads with sliced chicken breast or [place], balogona and cheese with sliced onion and lettuce. This is toasted and has butter. Water and occasionally Gatorade (regular)   Snk ( PM): None D ( PM): 5-6 :00 Pork roast with potato cakes (large and small)and salad with dressing.   Water Snk ( PM): rare, pop corn or ice cream and cake on rare occasions.  Beverages: water and gatorade.  Usual physical activity: walking on treadmill for 6 minutes at this time.  Is increasing hie time.  Busy cutting the grass and weed eating, sawing and splitting wood and stacking the wood.   Estimated energy needs:  Ht: 73 in  WT: 202.9 lb  BMI: 26.8 kg/m2  Adj WT: 191 lb (87 kg) 1800 calories 200-205 g carbohydrates 130-135 g protein 48-50 g fat  Progress Towards Goal(s):  In progress.   Nutritional Diagnosis:  [place]-2.1 Inpaired nutrition utilization As related to blood glucose.  As evidenced by diagnosis of type 2 diabetes, A1C at 7.1%..    Intervention:  Nutrition Review of diabetes and the interventions for controlling blood glucose.  Review of the food groups and how each affects the blood glucose levels.  Review of the carbohydrates, reading the food label and starting to count carbohydrates.Rcommended continuing to limit and especially to measure carb servings/portions.  Recommended 45-60 gm of CHO for meals, lean meats, free servings of non-starchy veggies, and 2-3 added fat servings at each meal.  Handouts given during visit include:  Living Well with Diabetes  Carbohydrate Counting by [employer]  Controlling Blood Glucose  My Plate Poster  Yellow Card with Diet Prescription and Exchange List  Menu Suggestions for the 45 GM and 60 Gm CHO meals.  Snack List  Monitoring/Evaluation:  Dietary intake, exercise, and body weight as he feels is needed.  He is to call with questions.

## 2012-06-20 NOTE — Patient Instructions (Addendum)
   Try to stay active daily walking on the treadmill and doing your working with the wood.  Instead of the regular Gatorade, try the G2 with your next purchase.   Try to keep your carbohydrates to 3-4 servings or 45-60 gm per meal . Meals have a lean protein, and free veggies, and 2-3 servings of added fat and the carbohydrate at 45-60 carb bucks.

## 2012-06-28 ENCOUNTER — Encounter: Payer: Self-pay | Admitting: Gastroenterology

## 2012-06-28 ENCOUNTER — Other Ambulatory Visit (INDEPENDENT_AMBULATORY_CARE_PROVIDER_SITE_OTHER): Payer: Medicare Other

## 2012-06-28 ENCOUNTER — Ambulatory Visit (INDEPENDENT_AMBULATORY_CARE_PROVIDER_SITE_OTHER): Payer: Medicare Other | Admitting: Gastroenterology

## 2012-06-28 VITALS — BP 108/54 | HR 60 | Ht 73.0 in | Wt 203.0 lb

## 2012-06-28 DIAGNOSIS — K7460 Unspecified cirrhosis of liver: Secondary | ICD-10-CM

## 2012-06-28 DIAGNOSIS — R1031 Right lower quadrant pain: Secondary | ICD-10-CM

## 2012-06-28 LAB — PROTIME-INR
INR: 1 ratio (ref 0.8–1.0)
Prothrombin Time: 10.9 s (ref 10.2–12.4)

## 2012-06-28 LAB — HEPATITIS B SURFACE ANTIGEN: Hepatitis B Surface Ag: NEGATIVE

## 2012-06-28 NOTE — Patient Instructions (Addendum)
You have been scheduled for an endoscopy. Please follow written instructions given to you at your visit today. If you use inhalers (even only as needed), please bring them with you on the day of your procedure.  Your physician has requested that you go to the basement for the following lab work before leaving today: PT/INR, AFP, Hepatitis panel.

## 2012-06-28 NOTE — Progress Notes (Signed)
History of Present Illness: This is an 78-year-old male returning for followup of newly diagnosed cirrhosis accompanied by his wife. His serologic workup was unremarkable. His ceruloplasmin was slightly low however it was normal 4 years ago. This is likely nonspecific abnormality. 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 He states he has an alcoholic beverage perhaps once every 4-6 months for the past 10 years. Hepatitis A total antibody and anti-hepatitis B were positive in 1999. Hepatitis C antibody was negative in 1999. Findings from an abdominal/pelvic CT scan as below:   1. Findings consistent with hepatic cirrhosis and portal venous  hypertension. Mild splenomegaly.  2. No evidence of hepatic neoplasm or ascites.  3. No evidence of recurrent or metastatic carcinoma.  4. Stable small hemorrhagic cyst in upper pole of left kidney.  5. Diverticulosis. No radiographic evidence of diverticulitis.   Current Medications, Allergies, Past Medical History, Past Surgical History, Family History and Social History were reviewed in [facility] electronic medical record.  Physical Exam: General: Well developed , well nourished, no acute distress Head: Normocephalic and atraumatic Eyes:  sclerae anicteric, EOMI Ears: Normal auditory acuity Mouth: No deformity or lesions Lungs: Clear throughout to auscultation Heart: Regular rate and rhythm; no murmurs, rubs or bruits Abdomen: Soft, non tender and non distended. No masses, hepatosplenomegaly or hernias noted. Normal Bowel sounds Musculoskeletal: Symmetrical with no gross deformities  Pulses:  Normal pulses noted Extremities: No clubbing, cyanosis, edema or deformities noted Neurological: Alert oriented x 4, grossly nonfocal Psychological:  Alert and cooperative. Normal mood and affect  Assessment and Recommendations:  1. Cirrhosis,  etiology unclear, perhaps alcohol related. Schedule upper endoscopy to evaluate for varices. The risks, benefits, and alternatives to endoscopy with possible biopsy and possible dilation were discussed with the patient and they consent to proceed. Monitor with abdominal ultrasound imaging and an alpha-fetoprotein in 6 months. Recheck chronic viral hepatitis serologies. Consider hepatitis A and B vaccine protocols pending results of serologies. Obtain alpha-fetoprotein and PT/INR today. Advised to avoid any alcohol intake and to take no more than 1.5 g of acetaminophen on a daily basis.  2. Right lower quadrant pain, chronic, likely related to adhesions an IBS.  3. GERD. Continue omeprazole. 

## 2012-07-02 ENCOUNTER — Ambulatory Visit (HOSPITAL_COMMUNITY)
Admission: RE | Admit: 2012-07-02 | Discharge: 2012-07-02 | Disposition: A | Discharge status: Home / Self Care | Payer: Medicare Other | Source: Ambulatory Visit | Attending: Gastroenterology | Admitting: Gastroenterology

## 2012-07-02 ENCOUNTER — Encounter (HOSPITAL_COMMUNITY)
Admission: RE | Disposition: A | Discharge status: Home / Self Care | Payer: Self-pay | Source: Ambulatory Visit | Attending: Gastroenterology

## 2012-07-02 ENCOUNTER — Encounter (HOSPITAL_COMMUNITY): Payer: Self-pay | Admitting: Gastroenterology

## 2012-07-02 DIAGNOSIS — K7460 Unspecified cirrhosis of liver: Secondary | ICD-10-CM

## 2012-07-02 DIAGNOSIS — K219 Gastro-esophageal reflux disease without esophagitis: Secondary | ICD-10-CM

## 2012-07-02 DIAGNOSIS — K319 Disease of stomach and duodenum, unspecified: Secondary | ICD-10-CM | POA: Insufficient documentation

## 2012-07-02 DIAGNOSIS — R1031 Right lower quadrant pain: Secondary | ICD-10-CM

## 2012-07-02 DIAGNOSIS — K766 Portal hypertension: Secondary | ICD-10-CM | POA: Insufficient documentation

## 2012-07-02 HISTORY — PX: ESOPHAGOGASTRODUODENOSCOPY: SHX5428

## 2012-07-02 LAB — GLUCOSE, CAPILLARY: Glucose-Capillary: 141 mg/dL — ABNORMAL HIGH (ref 70–99)

## 2012-07-02 SURGERY — EGD (ESOPHAGOGASTRODUODENOSCOPY)
Anesthesia: Moderate Sedation

## 2012-07-02 MED ORDER — DIPHENHYDRAMINE HCL 50 MG/ML IJ SOLN
INTRAMUSCULAR | Status: AC
  Filled 2012-07-02: qty 1

## 2012-07-02 MED ORDER — FENTANYL CITRATE 0.05 MG/ML IJ SOLN
INTRAMUSCULAR | Status: DC | PRN
  Administered 2012-07-02 (×2): 25 ug via INTRAVENOUS

## 2012-07-02 MED ORDER — MIDAZOLAM HCL 10 MG/2ML IJ SOLN
INTRAMUSCULAR | Status: DC | PRN
  Administered 2012-07-02 (×2): 2 mg via INTRAVENOUS

## 2012-07-02 MED ORDER — FENTANYL CITRATE 0.05 MG/ML IJ SOLN
INTRAMUSCULAR | Status: AC
  Filled 2012-07-02: qty 4

## 2012-07-02 MED ORDER — MIDAZOLAM HCL 10 MG/2ML IJ SOLN
INTRAMUSCULAR | Status: AC
  Filled 2012-07-02: qty 4

## 2012-07-02 MED ORDER — SODIUM CHLORIDE 0.9 % IV SOLN: INTRAVENOUS | Status: DC

## 2012-07-02 NOTE — H&P (View-Only) (Signed)
History of Present Illness: This is an 77 year old male returning for followup of newly diagnosed cirrhosis accompanied by his wife. His serologic workup was unremarkable. His ceruloplasmin was slightly low however it was normal 4 years ago. This is likely nonspecific abnormality. He has a history of heavy alcohol use in the distant past while in the [employer] and relates a history of hepatitis and jaundice while in the [employer]. He states he has an alcoholic beverage perhaps once every 4-6 months for the past 10 years. Hepatitis A total antibody and anti-hepatitis B were positive in 1999. Hepatitis C antibody was negative in 1999. Findings from an abdominal/pelvic CT scan as below:   1. Findings consistent with hepatic cirrhosis and portal venous  hypertension. Mild splenomegaly.  2. No evidence of hepatic neoplasm or ascites.  3. No evidence of recurrent or metastatic carcinoma.  4. Stable small hemorrhagic cyst in upper pole of left kidney.  5. Diverticulosis. No radiographic evidence of diverticulitis.   Current Medications, Allergies, Past Medical History, Past Surgical History, Family History and Social History were reviewed in [employer] record.  Physical Exam: General: Well developed , well nourished, no acute distress Head: Normocephalic and atraumatic Eyes:  sclerae anicteric, EOMI Ears: Normal auditory acuity Mouth: No deformity or lesions Lungs: Clear throughout to auscultation Heart: Regular rate and rhythm; no murmurs, rubs or bruits Abdomen: Soft, non tender and non distended. No masses, hepatosplenomegaly or hernias noted. Normal Bowel sounds Musculoskeletal: Symmetrical with no gross deformities  Pulses:  Normal pulses noted Extremities: No clubbing, cyanosis, edema or deformities noted Neurological: Alert oriented x 4, grossly nonfocal Psychological:  Alert and cooperative. Normal mood and affect  Assessment and Recommendations:  1. Cirrhosis,  etiology unclear, perhaps alcohol related. Schedule upper endoscopy to evaluate for varices. The risks, benefits, and alternatives to endoscopy with possible biopsy and possible dilation were discussed with the patient and they consent to proceed. Monitor with abdominal ultrasound imaging and an alpha-fetoprotein in 6 months. Recheck chronic viral hepatitis serologies. Consider hepatitis A and B vaccine protocols pending results of serologies. Obtain alpha-fetoprotein and PT/INR today. Advised to avoid any alcohol intake and to take no more than 1.5 g of acetaminophen on a daily basis.  2. Right lower quadrant pain, chronic, likely related to adhesions an IBS.  3. GERD. Continue omeprazole.

## 2012-07-02 NOTE — Interval H&P Note (Signed)
History and Physical Interval Note:  07/02/2012 8:41 AM  Alex Higgins  has presented today for surgery, with the diagnosis of Esophageal varices [456.1]  The various methods of treatment have been discussed with the patient and family. After consideration of risks, benefits and other options for treatment, the patient has consented to  Procedure(s) (LRB) with comments: ESOPHAGOGASTRODUODENOSCOPY (EGD) (N/A) as a surgical intervention .  The patient's history has been reviewed, patient examined, no change in status, stable for surgery.  I have reviewed the patient's chart and labs.  Questions were answered to the patient's satisfaction.     Venita Lick. Russella Dar MD Clementeen Graham

## 2012-07-02 NOTE — Op Note (Addendum)
[facility] [address] [place], [address]   ENDOSCOPY PROCEDURE REPORT  PATIENT: Alex Higgins, Alex Higgins  MR#: [id number] BIRTHDATE: 07-15-1930 , 82  yrs. old GENDER: Male ENDOSCOPIST: Meryl Dare, MD, [facility] PROCEDURE DATE:  07/02/2012 PROCEDURE:  EGD, diagnostic ASA CLASS:     Class III INDICATIONS:  screening for varices, cirrhosis, RLQ pain, GERD MEDICATIONS: These medications were titrated to patient response per physician's verbal order, Fentanyl 50 mcg IV, and Versed 4 mg IV TOPICAL ANESTHETIC: none DESCRIPTION OF PROCEDURE: After the risks benefits and alternatives of the procedure were thoroughly explained, informed consent was obtained.  The Pentax Gastroscope [id number] endoscope was introduced through the mouth and advanced to the second portion of the duodenum. Without limitations.  The instrument was slowly withdrawn as the mucosa was fully examined.   STOMACH: Mild portal hypertensive gastropathy was found in the entire examined stomach. There was patchy erythema and mild congestion noted. The stomach otherwise appeared normal.  No gastric varices noted. ESOPHAGUS: The mucosa of the esophagus appeared normal.  No esophageal varices noted. DUODENUM: The duodenal mucosa showed no abnormalities in the 2nd part of the duodenum.  Retroflexed views revealed no abnormalities. The scope was then withdrawn from the patient and the procedure completed.  COMPLICATIONS: There were no complications.  ENDOSCOPIC IMPRESSION: 1.   Portal hypertensive gastropathy   RECOMMENDATIONS: 1.  Repeat endoscopy in 1-2 years   eSigned:  Meryl Dare, MD, [facility] 07/02/2012 9:42 AM Revised: 07/02/2012 9:42 AM

## 2012-07-03 ENCOUNTER — Encounter (HOSPITAL_COMMUNITY): Payer: Self-pay

## 2012-07-03 ENCOUNTER — Encounter (HOSPITAL_COMMUNITY): Payer: Self-pay | Admitting: Gastroenterology

## 2012-07-03 ENCOUNTER — Ambulatory Visit: Payer: Medicare Other | Admitting: Gastroenterology

## 2012-07-03 DIAGNOSIS — K7460 Unspecified cirrhosis of liver: Secondary | ICD-10-CM

## 2012-07-03 MED ORDER — HEPATITIS B VAC RECOMBINANT 5 MCG/0.5ML IJ SUSP: 0.5000 mL | Freq: Once | INTRAMUSCULAR | Status: DC

## 2012-07-09 ENCOUNTER — Ambulatory Visit (INDEPENDENT_AMBULATORY_CARE_PROVIDER_SITE_OTHER): Payer: Medicare Other | Admitting: Cardiology

## 2012-07-09 VITALS — BP 130/77 | HR 68 | Ht 73.0 in | Wt 205.0 lb

## 2012-07-09 DIAGNOSIS — R55 Syncope and collapse: Secondary | ICD-10-CM

## 2012-07-09 DIAGNOSIS — I2510 Atherosclerotic heart disease of native coronary artery without angina pectoris: Secondary | ICD-10-CM

## 2012-07-09 NOTE — Patient Instructions (Signed)
Your physician recommends that you schedule a follow-up appointment as needed.   Your physician recommends that you continue on your current medications as directed. Please refer to the Current Medication list given to you today.  

## 2012-07-09 NOTE — Assessment & Plan Note (Signed)
He's had no further chest pain. His myocardial perfusion imaging study was negative for ischemia

## 2012-07-09 NOTE — Assessment & Plan Note (Signed)
Has some syncope seems most likely related to a neurally mediated entity.  I have provided specific instructions.  His wife clearly understands.  Should he have another spell, he is to let us know.

## 2012-07-09 NOTE — Progress Notes (Signed)
HPI:  Patient seen today in a followup visit. Has recently been told he has cirrhosis. Interestingly, this was noted on CT scan he also had CT scans and 2010 2011, it was never mentioned that time although he did have some splenomegaly. The patient denies any chest pain. He has been drinking some water, but his wife insists not enough. He has some early mediated syncope. Has not had any further episodes not given him specific instructions on how to handle this. The patient also recently underwent nuclear imaging which was negative for significant ischemia. He is in the process of a workup for his underlying cirrhosis.  Current Outpatient Prescriptions  Medication Sig Dispense Refill  . citalopram (CELEXA) 20 MG tablet Take 1 tablet (20 mg total) by mouth daily.  90 tablet  3  . ezetimibe (ZETIA) 10 MG tablet Take 1 tablet (10 mg total) by mouth daily.  90 tablet  3  . finasteride (PROSCAR) 5 MG tablet Take 1 tablet (5 mg total) by mouth daily.  90 tablet  3  . glycopyrrolate (ROBINUL) 2 MG tablet Take 1 tablet (2 mg total) by mouth 2 (two) times daily.  60 tablet  11  . linagliptin (TRADJENTA) 5 MG TABS tablet Take 1 tablet (5 mg total) by mouth daily.  90 tablet  3  . omeprazole (PRILOSEC) 40 MG capsule Take 1 capsule (40 mg total) by mouth 2 (two) times daily.  180 capsule  3  . pravastatin (PRAVACHOL) 80 MG tablet Take 1 tablet (80 mg total) by mouth daily.  90 tablet  3   Current Facility-Administered Medications  Medication Dose Route Frequency Provider Last Rate Last Dose  . hepatitis B vac recombinant (RECOMBIVAX) injection 5 mcg  0.5 mL Intramuscular Once Meryl Dare, MD,FACG        Allergies  Allergen Reactions  . Dutasteride Other (See Comments)    unknown  . Procaine Hcl Other (See Comments)    unknown    Past Medical History  Diagnosis Date  . CAD (coronary artery disease)   . Hypertension   . Hyperlipidemia   . Renal insufficiency   . Peripheral edema   . GERD  (gastroesophageal reflux disease)   . Diabetes mellitus, type 2   . Nonspecific elevation of levels of transaminase or lactic acid dehydrogenase (LDH)   . Headache   . Incisional hernia   . Oral aphthae   . Anxiety and depression   . BPH (benign prostatic hyperplasia)   . IBS (irritable bowel syndrome)   . Iron deficiency anemia   . Tubular adenoma of colon 10/1993  . Thrombocytopenia   . Hearing loss   . Diverticulosis   . Hemorrhoids   . Colon cancer 1985    arising from a rectal polyp  . Peripheral neuropathy 08/11/2011  . RLS (restless legs syndrome) 08/11/2011  . Postural dizziness   . Cirrhosis   . Portal venous hypertension     Past Surgical History  Procedure Date  . Ptca     stents  . Appendectomy   . Tonsillectomy   . Hernia repair   . Cholecystectomy 01/2008  . Hand surgery     bilateral  . Intraocular lens insertion   . Neck surgery     gland removed  . Esophagogastroduodenoscopy 07/02/2012    Procedure: ESOPHAGOGASTRODUODENOSCOPY (EGD);  Surgeon: Meryl Dare, MD,FACG;  Location: [facility];  Service: Endoscopy;  Laterality: N/A;    Family History  Problem Relation Age of  Onset  . Heart disease Mother     Brothers x 2  . Diabetes    . Hypertension      History   Social History  . Marital Status: Married    Spouse Name: N/A    Number of Children: 1  . Years of Education: N/A   Occupational History  . retired    Social History Main Topics  . Smoking status: Former Smoker    Types: Cigarettes    Quit date: 09/05/1958  . Smokeless tobacco: Never Used  . Alcohol Use: 0.0 oz/week     Comment: 1 beer once a month, reports drank heavily about 40 years ago  . Drug Use: No  . Sexually Active: Not Currently   Other Topics Concern  . Not on file   Social History Narrative  . No narrative on file    ROS: Please see the HPI.  All other systems reviewed and negative.  PHYSICAL EXAM:  BP 130/77  Pulse 68  Ht 6\' 1"  (1.854 m)  Wt 205 lb  (92.987 kg)  BMI 27.05 kg/m2  General: Well developed, well nourished, in no acute distress. Head:  Normocephalic and atraumatic. Neck: no JVD Lungs: Clear to auscultation and percussion. Heart: Normal S1 and S2.  No murmur, rubs or gallops.  Abdomen:  Normal bowel sounds; soft; non tender; no organomegaly Pulses: Pulses normal in all 4 extremities. Extremities: No clubbing or cyanosis. No edema. Neurologic: Alert and oriented x 3.  EKG:  See NUC STUDY  Impression  Exercise Capacity: Fair exercise capacity.  BP Response: Normal blood pressure response.  Clinical Symptoms: Mild chest pain/dyspnea.  ECG Impression: No significant ST segment change suggestive of ischemia.  Comparison with Prior Nuclear Study: No previous nuclear study performed  Overall Impression: Normal stress nuclear study.  LV Ejection Fraction: 69%. LV Wall Motion: NL LV Function; NL Wall Motion  Cassell Clement      ASSESSMENT AND PLAN:

## 2012-07-12 ENCOUNTER — Encounter: Payer: Self-pay | Admitting: Internal Medicine

## 2012-07-12 ENCOUNTER — Ambulatory Visit (INDEPENDENT_AMBULATORY_CARE_PROVIDER_SITE_OTHER): Payer: Medicare Other | Admitting: Internal Medicine

## 2012-07-12 VITALS — BP 118/60 | HR 71 | Temp 97.0°F | Ht 73.0 in | Wt 205.4 lb

## 2012-07-12 DIAGNOSIS — Z0000 Encounter for general adult medical examination without abnormal findings: Secondary | ICD-10-CM

## 2012-07-12 DIAGNOSIS — M25511 Pain in right shoulder: Secondary | ICD-10-CM | POA: Insufficient documentation

## 2012-07-12 DIAGNOSIS — E119 Type 2 diabetes mellitus without complications: Secondary | ICD-10-CM

## 2012-07-12 DIAGNOSIS — M25519 Pain in unspecified shoulder: Secondary | ICD-10-CM

## 2012-07-12 DIAGNOSIS — M5412 Radiculopathy, cervical region: Secondary | ICD-10-CM

## 2012-07-12 MED ORDER — CYCLOBENZAPRINE HCL 5 MG PO TABS
5.0000 mg | ORAL_TABLET | Freq: Three times a day (TID) | ORAL | Status: DC | PRN
Start: 2012-07-12 — End: 2012-08-15

## 2012-07-12 MED ORDER — TRAMADOL HCL 50 MG PO TABS: 50.0000 mg | ORAL_TABLET | Freq: Four times a day (QID) | ORAL | Status: DC | PRN

## 2012-07-12 NOTE — Patient Instructions (Addendum)
Take all new medications as prescribed - the tramadol and muscle relaxer as needed for pain Continue all other medications as before Please have the pharmacy call with any refills you may need. Please keep your appointments with your specialists as you have planned - the follow up appt for the immunization, and Dr Russella Dar in 3 months You will be contacted regarding the referral for:  MRI for the neck, and Neurosurgury Please return in 3 mo with Lab testing done 3-5 days before

## 2012-07-14 ENCOUNTER — Encounter: Payer: Self-pay | Admitting: Internal Medicine

## 2012-07-14 NOTE — Assessment & Plan Note (Signed)
stable overall by hx and exam, most recent data reviewed with pt, and pt to continue medical treatment as before Lab Results  Component Value Date   HGBA1C 7.1* 05/23/2012

## 2012-07-14 NOTE — Progress Notes (Signed)
Subjective:    Patient ID: Alex Higgins, male    DOB: 29-Oct-1929, 78 y.o.   MRN: [id number]  HPI  Here with wife, c/o right shoudler and neck pain, moderate for 3 wks, worse at the right neck with radiation to the distal RUE with numbness and weakness, also with some right shoudler pain on ROM but has full ROM; has recent finding of cirrhosis with normal INR, portal HTN, portal gastropathy.  Pt denies chest pain, increased sob or doe, wheezing, orthopnea, PND, increased LE swelling, palpitations, dizziness or syncope.   Pt denies polydipsia, polyuria.  Pt denies new neurological symptoms such as new headache, or facial or extremity weakness or numbness except for the above.   Pt denies fever, wt loss, night sweats, loss of appetite, or other constitutional symptoms Past Medical History  Diagnosis Date  . CAD (coronary artery disease)   . Hypertension   . Hyperlipidemia   . Renal insufficiency   . Peripheral edema   . GERD (gastroesophageal reflux disease)   . Diabetes mellitus, type 2   . Nonspecific elevation of levels of transaminase or lactic acid dehydrogenase (LDH)   . Headache   . Incisional hernia   . Oral aphthae   . Anxiety and depression   . BPH (benign prostatic hyperplasia)   . IBS (irritable bowel syndrome)   . Iron deficiency anemia   . Tubular adenoma of colon 10/1993  . Thrombocytopenia   . Hearing loss   . Diverticulosis   . Hemorrhoids   . Colon cancer 1985    arising from a rectal polyp  . Peripheral neuropathy 08/11/2011  . RLS (restless legs syndrome) 08/11/2011  . Postural dizziness   . Cirrhosis   . Portal venous hypertension    Past Surgical History  Procedure Date  . Ptca     stents  . Appendectomy   . Tonsillectomy   . Hernia repair   . Cholecystectomy 01/2008  . Hand surgery     bilateral  . Intraocular lens insertion   . Neck surgery     gland removed  . Esophagogastroduodenoscopy 07/02/2012    Procedure: ESOPHAGOGASTRODUODENOSCOPY (EGD);   Surgeon: Meryl Dare, MD,FACG;  Location: [facility];  Service: Endoscopy;  Laterality: N/A;    reports that he quit smoking about 53 years ago. His smoking use included Cigarettes. He has never used smokeless tobacco. He reports that he drinks alcohol. He reports that he does not use illicit drugs. family history includes Diabetes in an unspecified family member; Heart disease in his mother; and Hypertension in an unspecified family member. Allergies  Allergen Reactions  . Dutasteride Other (See Comments)    unknown  . Procaine Hcl Other (See Comments)    unknown   Current Outpatient Prescriptions on File Prior to Visit  Medication Sig Dispense Refill  . citalopram (CELEXA) 20 MG tablet Take 1 tablet (20 mg total) by mouth daily.  90 tablet  3  . ezetimibe (ZETIA) 10 MG tablet Take 1 tablet (10 mg total) by mouth daily.  90 tablet  3  . finasteride (PROSCAR) 5 MG tablet Take 1 tablet (5 mg total) by mouth daily.  90 tablet  3  . glycopyrrolate (ROBINUL) 2 MG tablet Take 1 tablet (2 mg total) by mouth 2 (two) times daily.  60 tablet  11  . linagliptin (TRADJENTA) 5 MG TABS tablet Take 1 tablet (5 mg total) by mouth daily.  90 tablet  3  . omeprazole (PRILOSEC) 40 MG  capsule Take 1 capsule (40 mg total) by mouth 2 (two) times daily.  180 capsule  3  . pravastatin (PRAVACHOL) 80 MG tablet Take 1 tablet (80 mg total) by mouth daily.  90 tablet  3   Current Facility-Administered Medications on File Prior to Visit  Medication Dose Route Frequency Provider Last Rate Last Dose  . hepatitis B vac recombinant (RECOMBIVAX) injection 5 mcg  0.5 mL Intramuscular Once Meryl Dare, MD,FACG       Review of Systems  Constitutional: Negative for diaphoresis and unexpected weight change.  HENT: Negative for tinnitus.   Eyes: Negative for photophobia and visual disturbance.  Respiratory: Negative for choking and stridor.   Gastrointestinal: Negative for vomiting and blood in stool.    Genitourinary: Negative for hematuria and decreased urine volume.  Musculoskeletal: Negative for gait problem.  Skin: Negative for color change and wound.  Neurological: Negative for tremors and numbness.  Psychiatric/Behavioral: Negative for decreased concentration. The patient is not hyperactive.       Objective:   Physical Exam BP 118/60  Pulse 71  Temp 97 F (36.1 C) (Oral)  Ht 6\' 1"  (1.854 m)  Wt 205 lb 6 oz (93.157 kg)  BMI 27.10 kg/m2  SpO2 94% Physical Exam  VS noted, not ill appaering Constitutional: Pt appears well-developed and well-nourished.  HENT: Head: Normocephalic.  Right Ear: External ear normal.  Left Ear: External ear normal.  Eyes: Conjunctivae and EOM are normal. Pupils are equal, round, and reactive to light.  Neck: Normal range of motion. Neck supple.  Cardiovascular: Normal rate and regular rhythm.   Pulmonary/Chest: Effort normal and breath sounds normal.  Abd:  Soft, NT, non-distended, + BS Neurological: Pt is alert. Not confused , cn 2-12 intact, motor 4+/5 RUE o/w motor/dtr intact Right shoudler NT, but with mild pain on active ROM, has FROM Skin: Skin is warm. No erythema.  Psychiatric: Pt behavior is normal. Thought content normal.     Assessment & Plan:

## 2012-07-14 NOTE — Assessment & Plan Note (Signed)
liekly separate issue from the neck, but minor in comparison with mild pain with FROM only,  to f/u any worsening symptoms or concerns

## 2012-07-14 NOTE — Assessment & Plan Note (Signed)
New onset right neck pain with RUE involvement/weakness, for MRI and NS referral, flexeril and tramadol prn,  to f/u any worsening symptoms or concerns

## 2012-07-19 ENCOUNTER — Other Ambulatory Visit: Payer: Self-pay | Admitting: Internal Medicine

## 2012-07-19 DIAGNOSIS — M5412 Radiculopathy, cervical region: Secondary | ICD-10-CM

## 2012-08-12 ENCOUNTER — Emergency Department (INDEPENDENT_AMBULATORY_CARE_PROVIDER_SITE_OTHER)
Admission: EM | Admit: 2012-08-12 | Discharge: 2012-08-12 | Disposition: A | Discharge status: Home / Self Care | Payer: Medicare Other | Source: Home / Self Care | Attending: Emergency Medicine | Admitting: Emergency Medicine

## 2012-08-12 ENCOUNTER — Encounter: Payer: Self-pay | Admitting: Emergency Medicine

## 2012-08-12 DIAGNOSIS — S0501XA Injury of conjunctiva and corneal abrasion without foreign body, right eye, initial encounter: Secondary | ICD-10-CM

## 2012-08-12 DIAGNOSIS — S058X9A Other injuries of unspecified eye and orbit, initial encounter: Secondary | ICD-10-CM

## 2012-08-12 MED ORDER — POLYMYXIN B-TRIMETHOPRIM 10000-0.1 UNIT/ML-% OP SOLN: OPHTHALMIC | Status: DC

## 2012-08-12 NOTE — ED Provider Notes (Signed)
History     CSN: [id number]  Arrival date & time 08/12/12  1256   First MD Initiated Contact with Patient 08/12/12 1259      Chief Complaint  Patient presents with  . Eye Pain     Patient is a 78 y.o. male presenting with eye pain. The history is provided by the patient and the spouse.  Eye Pain This is a new problem. The current episode started yesterday. The problem occurs constantly. The problem has been gradually improving. Pertinent negatives include no chest pain, no abdominal pain, no headaches and no shortness of breath. Nothing aggravates the symptoms. Relieved by: After he irrigated his eye yesterday. The treatment provided mild relief.   He was carrying firewood yesterday, when noticed acute onset of right eye discomfort, felt like a foreign body such as wood chip or dust. He your gated his right eye and that improved it. The pain was 7/10 yesterday, now a 3/10. He denies any visual change. Past Medical History  Diagnosis Date  . CAD (coronary artery disease)   . Hypertension   . Hyperlipidemia   . Renal insufficiency   . Peripheral edema   . GERD (gastroesophageal reflux disease)   . Diabetes mellitus, type 2   . Nonspecific elevation of levels of transaminase or lactic acid dehydrogenase (LDH)   . Headache   . Incisional hernia   . Oral aphthae   . Anxiety and depression   . BPH (benign prostatic hyperplasia)   . IBS (irritable bowel syndrome)   . Iron deficiency anemia   . Tubular adenoma of colon 10/1993  . Thrombocytopenia   . Hearing loss   . Diverticulosis   . Hemorrhoids   . Colon cancer 1985    arising from a rectal polyp  . Peripheral neuropathy 08/11/2011  . RLS (restless legs syndrome) 08/11/2011  . Postural dizziness   . Cirrhosis   . Portal venous hypertension     Past Surgical History  Procedure Date  . Ptca     stents  . Appendectomy   . Tonsillectomy   . Hernia repair   . Cholecystectomy 01/2008  . Hand surgery     bilateral  .  Intraocular lens insertion   . Neck surgery     gland removed  . Esophagogastroduodenoscopy 07/02/2012    Procedure: ESOPHAGOGASTRODUODENOSCOPY (EGD);  Surgeon: Meryl Dare, MD,FACG;  Location: [facility];  Service: Endoscopy;  Laterality: N/A;    Family History  Problem Relation Age of Onset  . Heart disease Mother     Brothers x 2  . Diabetes    . Hypertension      History  Substance Use Topics  . Smoking status: Former Smoker    Types: Cigarettes    Quit date: 09/05/1958  . Smokeless tobacco: Never Used  . Alcohol Use: 0.0 oz/week     Comment: 1 beer once a month, reports drank heavily about 40 years ago      Review of Systems  Eyes: Positive for pain.  Respiratory: Negative for shortness of breath.   Cardiovascular: Negative for chest pain.  Gastrointestinal: Negative for abdominal pain.  Neurological: Negative for headaches.  All other systems reviewed and are negative.    Allergies  Dutasteride and Procaine hcl  Home Medications   Current Outpatient Rx  Name  Route  Sig  Dispense  Refill  . CITALOPRAM HYDROBROMIDE 20 MG PO TABS   Oral   Take 1 tablet (20 mg total) by mouth daily.  90 tablet   3   . CYCLOBENZAPRINE HCL 5 MG PO TABS   Oral   Take 1 tablet (5 mg total) by mouth 3 (three) times daily as needed for muscle spasms.   60 tablet   2   . EZETIMIBE 10 MG PO TABS   Oral   Take 1 tablet (10 mg total) by mouth daily.   90 tablet   3   . FINASTERIDE 5 MG PO TABS   Oral   Take 1 tablet (5 mg total) by mouth daily.   90 tablet   3   . GLYCOPYRROLATE 2 MG PO TABS   Oral   Take 1 tablet (2 mg total) by mouth 2 (two) times daily.   60 tablet   11   . LINAGLIPTIN 5 MG PO TABS   Oral   Take 1 tablet (5 mg total) by mouth daily.   90 tablet   3   . OMEPRAZOLE 40 MG PO CPDR   Oral   Take 1 capsule (40 mg total) by mouth 2 (two) times daily.   180 capsule   3   . PRAVASTATIN SODIUM 80 MG PO TABS   Oral   Take 1 tablet (80 mg  total) by mouth daily.   90 tablet   3   . TRAMADOL HCL 50 MG PO TABS   Oral   Take 1 tablet (50 mg total) by mouth every 6 (six) hours as needed for pain.   60 tablet   2   . POLYMYXIN B-TRIMETHOPRIM 10000-0.1 UNIT/ML-% OP SOLN      1-2 drops in affected eye(s) Q 4-6 hours for 1 week   10 mL   0     BP 123/68  Pulse 66  Temp 97.4 F (36.3 C) (Oral)  Resp 16  Ht 6\' 1"  (1.854 m)  Wt 205 lb (92.987 kg)  BMI 27.05 kg/m2  SpO2 98%  Physical Exam  Nursing note and vitals reviewed. Constitutional: He is oriented to person, place, and time. He appears well-developed and well-nourished. No distress.  HENT:  Head: Normocephalic and atraumatic.  Eyes: Conjunctivae normal and EOM are normal. Pupils are equal, round, and reactive to light. No scleral icterus.         Because he has allergy to Novocain, I did not instill tetracaine eyedrops.  During exam of right eye, lids everted without any foreign body seen. I carefully used a water-soaked Q-tip the upper and lower eyelids, without any foreign body noted. He tolerated this well.    Neck: Normal range of motion.  Cardiovascular: Normal rate.   Pulmonary/Chest: Effort normal.  Abdominal: He exhibits no distension.  Musculoskeletal: Normal range of motion.  Neurological: He is alert and oriented to person, place, and time.  Skin: Skin is warm.  Psychiatric: He has a normal mood and affect.    ED Course  Procedures (including critical care time)  Labs Reviewed - No data to display No results found.   1. Corneal abrasion, right       MDM  Likely has small corneal abrasion right eye. No evidence of any other acute abnormality of right eye. Visual acuity grossly intact and equal both eyes I advised that we do fluorescein staining to try to view the abrasion better, but he politely refused, even after I explained risks of not doing so. He politely refused any eye patch. We discussed symptomatic care. Red flags  discussed with patient and wife. Polytrim eyedrops prescribed.  Followup with his eye Dr. if no better in one day, sooner if worse or new symptoms. Patient and wife voiced understanding and agreement with treatment plan.        Lajean Manes, MD 08/12/12 [phone]

## 2012-08-12 NOTE — ED Notes (Signed)
Vision with corrective glasses: 20/30, bilateral and individually.

## 2012-08-12 NOTE — ED Notes (Signed)
Patient reports firewood chip getting into right eye yesterday; thought it was out but awoke began feeling discomfort in eye later in day.

## 2012-08-13 ENCOUNTER — Other Ambulatory Visit (INDEPENDENT_AMBULATORY_CARE_PROVIDER_SITE_OTHER): Payer: Medicare Other

## 2012-08-13 DIAGNOSIS — E119 Type 2 diabetes mellitus without complications: Secondary | ICD-10-CM

## 2012-08-13 DIAGNOSIS — Z0000 Encounter for general adult medical examination without abnormal findings: Secondary | ICD-10-CM

## 2012-08-13 LAB — CBC WITH DIFFERENTIAL/PLATELET
Basophils Absolute: 0 10*3/uL (ref 0.0–0.1)
Eosinophils Absolute: 0.2 10*3/uL (ref 0.0–0.7)
Lymphocytes Relative: 27.3 % (ref 12.0–46.0)
MCHC: 33.6 g/dL (ref 30.0–36.0)
Neutrophils Relative %: 60.7 % (ref 43.0–77.0)
Platelets: 122 10*3/uL — ABNORMAL LOW (ref 150.0–400.0)
RDW: 13.4 % (ref 11.5–14.6)

## 2012-08-13 LAB — HEPATIC FUNCTION PANEL
Alkaline Phosphatase: 68 U/L (ref 39–117)
Bilirubin, Direct: 0.3 mg/dL (ref 0.0–0.3)

## 2012-08-13 LAB — URINALYSIS, ROUTINE W REFLEX MICROSCOPIC
Leukocytes, UA: NEGATIVE
Nitrite: NEGATIVE
Specific Gravity, Urine: >=1.03 (ref 1.000–1.030)
pH: 6 (ref 5.0–8.0)

## 2012-08-13 LAB — BASIC METABOLIC PANEL
BUN: 16 mg/dL (ref 6–23)
CO2: 28 mEq/L (ref 19–32)
Chloride: 102 mEq/L (ref 96–112)
Creatinine, Ser: 1.7 mg/dL — ABNORMAL HIGH (ref 0.4–1.5)

## 2012-08-13 LAB — LDL CHOLESTEROL, DIRECT: Direct LDL: 139.9 mg/dL

## 2012-08-13 LAB — MICROALBUMIN / CREATININE URINE RATIO: Microalb Creat Ratio: 0.5 mg/g (ref 0.0–30.0)

## 2012-08-13 LAB — LIPID PANEL
Total CHOL/HDL Ratio: 7
Triglycerides: 337 mg/dL — ABNORMAL HIGH (ref 0.0–149.0)
VLDL: 67.4 mg/dL — ABNORMAL HIGH (ref 0.0–40.0)

## 2012-08-15 ENCOUNTER — Encounter: Payer: Self-pay | Admitting: Internal Medicine

## 2012-08-15 ENCOUNTER — Ambulatory Visit (INDEPENDENT_AMBULATORY_CARE_PROVIDER_SITE_OTHER): Payer: Medicare Other | Admitting: Internal Medicine

## 2012-08-15 VITALS — BP 109/62 | HR 69 | Temp 97.1°F | Ht 73.0 in | Wt 202.5 lb

## 2012-08-15 DIAGNOSIS — M545 Low back pain, unspecified: Secondary | ICD-10-CM

## 2012-08-15 DIAGNOSIS — E119 Type 2 diabetes mellitus without complications: Secondary | ICD-10-CM

## 2012-08-15 DIAGNOSIS — N189 Chronic kidney disease, unspecified: Secondary | ICD-10-CM

## 2012-08-15 DIAGNOSIS — Z23 Encounter for immunization: Secondary | ICD-10-CM

## 2012-08-15 DIAGNOSIS — K7460 Unspecified cirrhosis of liver: Secondary | ICD-10-CM

## 2012-08-15 DIAGNOSIS — M25511 Pain in right shoulder: Secondary | ICD-10-CM

## 2012-08-15 DIAGNOSIS — Z0000 Encounter for general adult medical examination without abnormal findings: Secondary | ICD-10-CM

## 2012-08-15 DIAGNOSIS — M25519 Pain in unspecified shoulder: Secondary | ICD-10-CM

## 2012-08-15 HISTORY — DX: Unspecified cirrhosis of liver: K74.60

## 2012-08-15 HISTORY — DX: Chronic kidney disease, unspecified: N18.9

## 2012-08-15 MED ORDER — CYCLOBENZAPRINE HCL 5 MG PO TABS: 5.0000 mg | ORAL_TABLET | Freq: Three times a day (TID) | ORAL | Status: DC | PRN

## 2012-08-15 MED ORDER — FINASTERIDE 5 MG PO TABS: 5.0000 mg | ORAL_TABLET | Freq: Every day | ORAL | Status: DC

## 2012-08-15 MED ORDER — EZETIMIBE 10 MG PO TABS: 10.0000 mg | ORAL_TABLET | Freq: Every day | ORAL | Status: DC

## 2012-08-15 MED ORDER — LINAGLIPTIN 5 MG PO TABS: 5.0000 mg | ORAL_TABLET | Freq: Every day | ORAL | Status: DC

## 2012-08-15 MED ORDER — OMEPRAZOLE 40 MG PO CPDR: 40.0000 mg | DELAYED_RELEASE_CAPSULE | Freq: Two times a day (BID) | ORAL | Status: DC

## 2012-08-15 MED ORDER — PRAVASTATIN SODIUM 80 MG PO TABS: 80.0000 mg | ORAL_TABLET | Freq: Every day | ORAL | Status: DC

## 2012-08-15 MED ORDER — CITALOPRAM HYDROBROMIDE 20 MG PO TABS: 20.0000 mg | ORAL_TABLET | Freq: Every day | ORAL | Status: DC

## 2012-08-15 NOTE — Assessment & Plan Note (Signed)
With mild increasing a1c, needs to follow better diet, declines change in oha today

## 2012-08-15 NOTE — Patient Instructions (Addendum)
You had the Hepatitis B shot #2 today Please remember to call for a nurse visit about January 01, 2013 for Hepatitis B shot #3 either with Dr Russella Dar or my office Please return if you change your mind about the flu, pneumonia and tetanus shots You are given the labs test results You are otherwise up to date with prevention Take all new medications as prescribed - the flexeril for muscle relaxer Continue all other medications as before You are given all the hardcopy refills today Please have the pharmacy call with any other refills you may need. Thank you for enrolling in MyChart. Please follow the instructions below to securely access your online medical record. MyChart allows you to send messages to your doctor, view your test results, renew your prescriptions, schedule appointments, and more. To Log into MyChart, please go to [email or web address], and your Username is: [email or web address] Please keep your appointments with your specialists as you have planned -Dr Rochele Pages, and Dr Russella Dar, and Dr Laverle Patter Your form was filled out today Please return in 6 mo with Lab testing done 3-5 days before

## 2012-08-15 NOTE — Addendum Note (Signed)
Addended by: Scharlene Gloss B on: 08/15/2012 10:14 AM   Modules accepted: Orders

## 2012-08-15 NOTE — Assessment & Plan Note (Signed)
Overall doing well, age appropriate education and counseling updated, referrals for preventative services and immunizations addressed, dietary and smoking counseling addressed, most recent labs and ECG reviewed.  I have personally reviewed and have noted: 1) the patient's medical and social history 2) The pt's use of alcohol, tobacco, and illicit drugs 3) The patient's current medications and supplements 4) Functional ability including ADL's, fall risk, home safety risk, hearing and visual impairment 5) Diet and physical activities 6) Evidence for depression or mood disorder 7) The patient's height, weight, and BMI have been recorded in the chart I have made referrals, and provided counseling and education based on review of the above Delc[facility] other immunizations today

## 2012-08-15 NOTE — Assessment & Plan Note (Signed)
Suspect msk strain vs underlying spine deg dz - for trial flexeril 5 mg prn,  to f/u any worsening symptoms or concerns

## 2012-08-15 NOTE — Progress Notes (Signed)
Subjective:    Patient ID: Alex Higgins, male    DOB: 1929-12-01, 78 y.o.   MRN: [id number]  HPI  Here for wellness and f/u;  Overall doing ok;  Pt denies CP, worsening SOB, DOE, wheezing, orthopnea, PND, worsening LE edema, palpitations, dizziness or syncope.  Pt denies neurological change such as new Headache, facial or extremity weakness.  Pt denies polydipsia, polyuria, or low sugar symptoms. Pt states overall good compliance with treatment and medications, good tolerability, and trying to follow lower cholesterol diet.  Pt denies worsening depressive symptoms, suicidal ideation or panic. No fever, wt loss, night sweats, loss of appetite, or other constitutional symptoms.  Pt states good ability with ADL's, low fall risk, home safety reviewed and adequate, no significant changes in hearing or vision, and occasionally active with exercise.  Still with right neck and shoulder pain with radiation to the distal extremity and hand with tingling; no weakness of the hand or arm, but also with shoulder and elbow pain and shoulder blade pain with abduction.  Saw Dr Leandrew Koyanagi- [facility], had cortisone to right shoulder, but no improvement.  Pt continues to have recurring LBP without change in severity but recurrent since the date of his recent colonoscopy, but no bowel or bladder change, fever, wt loss,  worsening LE pain/numbness/weakness, gait change or falls except for pain to inner thigh that will catch with stadning, cant move for a second until seems to relax.   Past Medical History  Diagnosis Date  . CAD (coronary artery disease)   . Hypertension   . Hyperlipidemia   . Renal insufficiency   . Peripheral edema   . GERD (gastroesophageal reflux disease)   . Diabetes mellitus, type 2   . Nonspecific elevation of levels of transaminase or lactic acid dehydrogenase (LDH)   . Headache   . Incisional hernia   . Oral aphthae   . Anxiety and depression   . BPH (benign prostatic  hyperplasia)   . IBS (irritable bowel syndrome)   . Iron deficiency anemia   . Tubular adenoma of colon 10/1993  . Thrombocytopenia   . Hearing loss   . Diverticulosis   . Hemorrhoids   . Colon cancer 1985    arising from a rectal polyp  . Peripheral neuropathy 08/11/2011  . RLS (restless legs syndrome) 08/11/2011  . Postural dizziness   . Cirrhosis   . Portal venous hypertension   . CKD (chronic kidney disease) 08/15/2012   Past Surgical History  Procedure Date  . Ptca     stents  . Appendectomy   . Tonsillectomy   . Hernia repair   . Cholecystectomy 01/2008  . Hand surgery     bilateral  . Intraocular lens insertion   . Neck surgery     gland removed  . Esophagogastroduodenoscopy 07/02/2012    Procedure: ESOPHAGOGASTRODUODENOSCOPY (EGD);  Surgeon: Meryl Dare, MD,FACG;  Location: [facility];  Service: Endoscopy;  Laterality: N/A;    reports that he quit smoking about 53 years ago. His smoking use included Cigarettes. He has never used smokeless tobacco. He reports that he drinks alcohol. He reports that he does not use illicit drugs. family history includes Diabetes in an unspecified family member; Heart disease in his mother; and Hypertension in an unspecified family member. Allergies  Allergen Reactions  . Dutasteride Other (See Comments)    unknown  . Procaine Hcl Other (See Comments)    unknown   Current Outpatient Prescriptions on File Prior to  Visit  Medication Sig Dispense Refill  . citalopram (CELEXA) 20 MG tablet Take 1 tablet (20 mg total) by mouth daily.  90 tablet  3  . cyclobenzaprine (FLEXERIL) 5 MG tablet Take 1 tablet (5 mg total) by mouth 3 (three) times daily as needed for muscle spasms.  60 tablet  2  . ezetimibe (ZETIA) 10 MG tablet Take 1 tablet (10 mg total) by mouth daily.  90 tablet  3  . finasteride (PROSCAR) 5 MG tablet Take 1 tablet (5 mg total) by mouth daily.  90 tablet  3  . glycopyrrolate (ROBINUL) 2 MG tablet Take 1 tablet (2 mg  total) by mouth 2 (two) times daily.  60 tablet  11  . linagliptin (TRADJENTA) 5 MG TABS tablet Take 1 tablet (5 mg total) by mouth daily.  90 tablet  3  . omeprazole (PRILOSEC) 40 MG capsule Take 1 capsule (40 mg total) by mouth 2 (two) times daily.  180 capsule  3  . pravastatin (PRAVACHOL) 80 MG tablet Take 1 tablet (80 mg total) by mouth daily.  90 tablet  3  . traMADol (ULTRAM) 50 MG tablet Take 1 tablet (50 mg total) by mouth every 6 (six) hours as needed for pain.  60 tablet  2  . trimethoprim-polymyxin b (POLYTRIM) ophthalmic solution 1-2 drops in affected eye(s) Q 4-6 hours for 1 week  10 mL  0   Current Facility-Administered Medications on File Prior to Visit  Medication Dose Route Frequency Provider Last Rate Last Dose  . hepatitis B vac recombinant (RECOMBIVAX) injection 5 mcg  0.5 mL Intramuscular Once Meryl Dare, MD,FACG       Review of Systems Review of Systems  Constitutional: Negative for diaphoresis, activity change, appetite change and unexpected weight change.  HENT: Negative for hearing loss, ear pain, facial swelling, mouth sores and neck stiffness.   Eyes: Negative for pain, redness and visual disturbance.  Respiratory: Negative for shortness of breath and wheezing.   Cardiovascular: Negative for chest pain and palpitations.  Gastrointestinal: Negative for diarrhea, blood in stool, abdominal distention and rectal pain.  Genitourinary: Negative for hematuria, and decreased urine volume.  Musculoskeletal: Negative for myalgias and joint swelling.  Skin: Negative for color change and wound.  Neurological: Negative for syncope and numbness.  Hematological: Negative for adenopathy.  Psychiatric/Behavioral: Negative for hallucinations, self-injury, decreased concentration and agitation.     Objective:   Physical Exam BP 109/62  Pulse 69  Temp 97.1 F (36.2 C) (Oral)  Ht 6\' 1"  (1.854 m)  Wt 202 lb 8 oz (91.853 kg)  BMI 26.72 kg/m2  SpO2 94% Physical Exam  VS  noted Constitutional: Pt is oriented to person, place, and time. Appears well-developed and well-nourished.  HENT:  Head: Normocephalic and atraumatic.  Right Ear: External ear normal.  Left Ear: External ear normal.  Nose: Nose normal.  Mouth/Throat: Oropharynx is clear and moist.  Eyes: Conjunctivae and EOM are normal. Pupils are equal, round, and reactive to light.  Neck: Normal range of motion. Neck supple. No JVD present. No tracheal deviation present.  Cardiovascular: Normal rate, regular rhythm, normal heart sounds and intact distal pulses.   Pulmonary/Chest: Effort normal and breath sounds normal.  Abdominal: Soft. Bowel sounds are normal. There is no tenderness.  Musculoskeletal: Normal range of motion. Exhibits no edema.  Lymphadenopathy:  Has no cervical adenopathy.  Neurological: Pt is alert and oriented to person, place, and time. Pt has normal reflexes. No cranial nerve deficit.  Skin:  Skin is warm and dry. No rash noted.  Psychiatric:  Has  normal mood and affect. Behavior is normal. 1+ nervous/chronic Spine nontender;  Lumbar paravertebral right mild tender Bilat with mild decrased ROM left > right, but no pain on passive motion Right shoulder with pain on active and passive abduction to 90 degrees only    Assessment & Plan:

## 2012-08-15 NOTE — Assessment & Plan Note (Signed)
Persistent despite fairly recent tx per dr Rochele Pages, I encourage pt to f/u back with Dr Sol Passer - ? Need MRI, cont current pain med

## 2012-08-15 NOTE — Assessment & Plan Note (Signed)
?   etoh related; for hep B shot #2 as he is due, then f/u with #3 at 6 mo with me or Dr Russella Dar, has f/u for OV with GI at 6 mo planned

## 2012-09-06 ENCOUNTER — Ambulatory Visit (INDEPENDENT_AMBULATORY_CARE_PROVIDER_SITE_OTHER): Payer: Medicare Other | Admitting: Internal Medicine

## 2012-09-06 ENCOUNTER — Encounter: Payer: Self-pay | Admitting: Internal Medicine

## 2012-09-06 VITALS — BP 120/62 | HR 64 | Temp 97.3°F | Ht 73.0 in | Wt 199.0 lb

## 2012-09-06 DIAGNOSIS — R1011 Right upper quadrant pain: Secondary | ICD-10-CM

## 2012-09-06 MED ORDER — HYDROCODONE-IBUPROFEN 5-200 MG PO TABS: 1.0000 | ORAL_TABLET | ORAL | Status: DC | PRN

## 2012-09-06 NOTE — Patient Instructions (Addendum)
Cirrhosis  Cirrhosis is a condition of scarring of the liver which is caused when the liver has tried repairing itself following damage. This damage may come from a previous infection such as one of the forms of hepatitis (usually hepatitis C), or the damage may come from being injured by toxins. The main toxin that causes this damage is alcohol. The scarring of the liver from use of alcohol is irreversible. That means the liver cannot return to normal even though alcohol is not used any more. The main danger of hepatitis C infection is that it may cause long-lasting (chronic) liver disease, and this also may lead to cirrhosis. This complication is progressive and irreversible.  CAUSES   Prior to available blood tests, hepatitis C could be contracted by blood transfusions. Since testing of blood has improved, this is now unlikely. This infection can also be contracted through intravenous drug use and the sharing of needles. It can also be contracted through sexual relationships. The injury caused by alcohol comes from too much use. It is not a few drinks that poison the liver, but years of misuse. Usually there will be some signs and symptoms early with scarring of the liver that suggest the development of better habits. Alcohol should never be used while using acetaminophen. A small dose of both taken together may cause irreversible damage to the liver.  HOME CARE INSTRUCTIONS   There is no specific treatment for cirrhosis. However, there are things you can do to avoid making the condition worse.   Rest as needed.   Eat a well-balanced diet. Your caregiver can help you with suggestions.   Vitamin supplements including vitamins A, K, D, and thiamine can help.   A low-salt diet, water restriction, or diuretic medicine may be needed to reduce fluid retention.   Avoid alcohol. This can be extremely toxic if combined with acetaminophen.   Avoid drugs which are toxic to the liver. Some of these include isoniazid,  methyldopa, acetaminophen, anabolic steroids (muscle-building drugs), erythromycin, and oral contraceptives (birth control pills). Check with your caregiver to make sure medicines you are presently taking will not be harmful.   Periodic blood tests may be required. Follow your caregiver's advice regarding the timing of these.   Milk thistle is an herbal remedy which does protect the liver against toxins. However, it will not help once the liver has been scarred.  SEEK MEDICAL CARE IF:   You have increasing fatigue or weakness.   You develop swelling of the hands, feet, legs, or face.   You vomit bright red blood, or a coffee ground appearing material.   You have blood in your stools, or the stools turn black and tarry.   You have a fever.   You develop loss of appetite, or have nausea and vomiting.   You develop jaundice.   You develop easy bruising or bleeding.   You have worsening of any of the problems you are concerned about.  Document Released: 08/22/2005 Document Revised: 11/14/2011 Document Reviewed: 04/09/2008  [employer] Patient Information 2013 [employer].

## 2012-09-06 NOTE — Progress Notes (Signed)
Subjective:    Patient ID: Alex Higgins, male    DOB: 16-Jun-1930, 79 y.o.   MRN: [id number]  HPI  Pt presents to the clinic today with c/o right side pain. This started approximately 1 month ago when he was seen for a similar type of  pain. CT of the abdomen showed hepatic cirrhosis with portal hypertension. He did have Hep A multiple times whiles in the [employer]. He did used to drink heavily. The pain is constant and it used to be relieved by aleve, but that has stopped helping. Nothing makes it worse, it just stays the same. He is requesting referral to a specialist. He denies changes in his skin color, itching, swelling of his abdomen, altered mental status or dark colored urine. He has not had any bleeding.  Review of Systems      Past Medical History  Diagnosis Date  . CAD (coronary artery disease)   . Hypertension   . Hyperlipidemia   . Renal insufficiency   . Peripheral edema   . GERD (gastroesophageal reflux disease)   . Diabetes mellitus, type 2   . Nonspecific elevation of levels of transaminase or lactic acid dehydrogenase (LDH)   . Headache   . Incisional hernia   . Oral aphthae   . Anxiety and depression   . BPH (benign prostatic hyperplasia)   . IBS (irritable bowel syndrome)   . Iron deficiency anemia   . Tubular adenoma of colon 10/1993  . Thrombocytopenia   . Hearing loss   . Diverticulosis   . Hemorrhoids   . Colon cancer 1985    arising from a rectal polyp  . Peripheral neuropathy 08/11/2011  . RLS (restless legs syndrome) 08/11/2011  . Postural dizziness   . Cirrhosis   . Portal venous hypertension   . CKD (chronic kidney disease) 08/15/2012  . Hepatic cirrhosis 08/15/2012    Current Outpatient Prescriptions  Medication Sig Dispense Refill  . citalopram (CELEXA) 20 MG tablet Take 1 tablet (20 mg total) by mouth daily.  90 tablet  3  . cyclobenzaprine (FLEXERIL) 5 MG tablet Take 1 tablet (5 mg total) by mouth 3 (three) times daily as needed for  muscle spasms.  60 tablet  2  . ezetimibe (ZETIA) 10 MG tablet Take 1 tablet (10 mg total) by mouth daily.  90 tablet  3  . finasteride (PROSCAR) 5 MG tablet Take 1 tablet (5 mg total) by mouth daily.  90 tablet  3  . glycopyrrolate (ROBINUL) 2 MG tablet Take 1 tablet (2 mg total) by mouth 2 (two) times daily.  60 tablet  11  . linagliptin (TRADJENTA) 5 MG TABS tablet Take 1 tablet (5 mg total) by mouth daily.  90 tablet  3  . omeprazole (PRILOSEC) 40 MG capsule Take 1 capsule (40 mg total) by mouth 2 (two) times daily.  180 capsule  3  . pravastatin (PRAVACHOL) 80 MG tablet Take 1 tablet (80 mg total) by mouth daily.  90 tablet  3  . traMADol (ULTRAM) 50 MG tablet Take 1 tablet (50 mg total) by mouth every 6 (six) hours as needed for pain.  60 tablet  2   Current Facility-Administered Medications  Medication Dose Route Frequency Provider Last Rate Last Dose  . hepatitis B vac recombinant (RECOMBIVAX) injection 5 mcg  0.5 mL Intramuscular Once Meryl Dare, MD,FACG        Allergies  Allergen Reactions  . Dutasteride Other (See Comments)    unknown  .  Procaine Hcl Other (See Comments)    unknown    Family History  Problem Relation Age of Onset  . Heart disease Mother     Brothers x 2  . Diabetes    . Hypertension      History   Social History  . Marital Status: Married    Spouse Name: N/A    Number of Children: 1  . Years of Education: N/A   Occupational History  . retired    Social History Main Topics  . Smoking status: Former Smoker    Types: Cigarettes    Quit date: 09/05/1958  . Smokeless tobacco: Never Used  . Alcohol Use: 0.0 oz/week     Comment: 1 beer once a month, reports drank heavily about 40 years ago  . Drug Use: No  . Sexually Active: Not Currently   Other Topics Concern  . Not on file   Social History Narrative  . No narrative on file     Constitutional: Denies fever, malaise, fatigue, headache or abrupt weight changes.  Respiratory: Denies  difficulty breathing, shortness of breath, cough or sputum production.   Cardiovascular: Denies chest pain, chest tightness, palpitations or swelling in the hands or feet.  Gastrointestinal: Pt reports abdominal pain ine the RUQ. Denies bloating, constipation, diarrhea or blood in the stool.    No other specific complaints in a complete review of systems (except as listed in HPI above).  Objective:   Physical Exam   BP 120/62  Pulse 64  Temp 97.3 F (36.3 C) (Oral)  Ht 6\' 1"  (1.854 m)  Wt 199 lb (90.266 kg)  BMI 26.25 kg/m2  SpO2 96% Wt Readings from Last 3 Encounters:  09/06/12 199 lb (90.266 kg)  08/15/12 202 lb 8 oz (91.853 kg)  08/12/12 205 lb (92.987 kg)    General: Appears his stated age, well developed, well nourished in NAD.  Cardiovascular: Normal rate and rhythm. S1,S2 noted.  No murmur, rubs or gallops noted. No JVD or BLE edema. No carotid bruits noted. Pulmonary/Chest: Normal effort and positive vesicular breath sounds. No respiratory distress. No wheezes, rales or ronchi noted.  Abdomen: Soft. Tender to palpation in the RUQ. Normal bowel sounds, no bruits noted. Mild distention. No masses noted. Liver enlarged. Spleen and kidneys non palpable.   BMET    Component Value Date/Time   NA 137 08/13/2012 1525   K 3.8 08/13/2012 1525   CL 102 08/13/2012 1525   CO2 28 08/13/2012 1525   GLUCOSE 159* 08/13/2012 1525   BUN 16 08/13/2012 1525   CREATININE 1.7* 08/13/2012 1525   CALCIUM 8.8 08/13/2012 1525   CALCIUM 7.3* 02/18/2009 0450   GFRNONAA 42* 05/18/2012 1407   GFRAA 49* 05/18/2012 1407    Lipid Panel     Component Value Date/Time   CHOL 196 08/13/2012 1525   TRIG 337.0* 08/13/2012 1525   HDL 28.10* 08/13/2012 1525   CHOLHDL 7 08/13/2012 1525   VLDL 67.4* 08/13/2012 1525   LDLCALC 53 08/11/2011 1204    CBC    Component Value Date/Time   WBC 5.1 08/13/2012 1525   RBC 4.73 08/13/2012 1525   HGB 15.1 08/13/2012 1525   HCT 45.1 08/13/2012 1525   PLT 122.0* 08/13/2012  1525   MCV 95.4 08/13/2012 1525   MCH 32.2 05/18/2012 1407   MCHC 33.6 08/13/2012 1525   RDW 13.4 08/13/2012 1525   LYMPHSABS 1.4 08/13/2012 1525   MONOABS 0.4 08/13/2012 1525   EOSABS 0.2 08/13/2012 1525  BASOSABS 0.0 08/13/2012 1525    Hgb A1C Lab Results  Component Value Date   HGBA1C 7.4* 08/13/2012        Assessment & Plan:   RUQ abdominal pain, new onset with additional workup required:  Avoid alcohol and tylenol containing products Per pt request, will refer pt to Hepatology for further evaluation eRx for Vicoprofen  RTC as needed or if symptoms persist

## 2012-09-10 NOTE — Addendum Note (Signed)
Addended by: Lorre Munroe on: 09/10/2012 08:46 AM   Modules accepted: Orders

## 2012-09-18 ENCOUNTER — Emergency Department (HOSPITAL_COMMUNITY)
Admission: EM | Admit: 2012-09-18 | Discharge: 2012-09-19 | Disposition: A | Discharge status: Home / Self Care | Payer: Medicare Other | Attending: Emergency Medicine | Admitting: Emergency Medicine

## 2012-09-18 ENCOUNTER — Encounter (HOSPITAL_COMMUNITY): Payer: Self-pay | Admitting: *Deleted

## 2012-09-18 DIAGNOSIS — Z8659 Personal history of other mental and behavioral disorders: Secondary | ICD-10-CM | POA: Insufficient documentation

## 2012-09-18 DIAGNOSIS — K921 Melena: Secondary | ICD-10-CM | POA: Insufficient documentation

## 2012-09-18 DIAGNOSIS — R300 Dysuria: Secondary | ICD-10-CM | POA: Insufficient documentation

## 2012-09-18 DIAGNOSIS — Z9089 Acquired absence of other organs: Secondary | ICD-10-CM | POA: Insufficient documentation

## 2012-09-18 DIAGNOSIS — Z87891 Personal history of nicotine dependence: Secondary | ICD-10-CM | POA: Insufficient documentation

## 2012-09-18 DIAGNOSIS — R7402 Elevation of levels of lactic acid dehydrogenase (LDH): Secondary | ICD-10-CM | POA: Insufficient documentation

## 2012-09-18 DIAGNOSIS — Z8719 Personal history of other diseases of the digestive system: Secondary | ICD-10-CM | POA: Insufficient documentation

## 2012-09-18 DIAGNOSIS — IMO0001 Reserved for inherently not codable concepts without codable children: Secondary | ICD-10-CM | POA: Insufficient documentation

## 2012-09-18 DIAGNOSIS — R142 Eructation: Secondary | ICD-10-CM | POA: Insufficient documentation

## 2012-09-18 DIAGNOSIS — M549 Dorsalgia, unspecified: Secondary | ICD-10-CM | POA: Insufficient documentation

## 2012-09-18 DIAGNOSIS — K219 Gastro-esophageal reflux disease without esophagitis: Secondary | ICD-10-CM | POA: Insufficient documentation

## 2012-09-18 DIAGNOSIS — K769 Liver disease, unspecified: Secondary | ICD-10-CM | POA: Insufficient documentation

## 2012-09-18 DIAGNOSIS — Z87448 Personal history of other diseases of urinary system: Secondary | ICD-10-CM | POA: Insufficient documentation

## 2012-09-18 DIAGNOSIS — Z8739 Personal history of other diseases of the musculoskeletal system and connective tissue: Secondary | ICD-10-CM | POA: Insufficient documentation

## 2012-09-18 DIAGNOSIS — Z8669 Personal history of other diseases of the nervous system and sense organs: Secondary | ICD-10-CM | POA: Insufficient documentation

## 2012-09-18 DIAGNOSIS — R141 Gas pain: Secondary | ICD-10-CM | POA: Insufficient documentation

## 2012-09-18 DIAGNOSIS — N189 Chronic kidney disease, unspecified: Secondary | ICD-10-CM | POA: Insufficient documentation

## 2012-09-18 DIAGNOSIS — R630 Anorexia: Secondary | ICD-10-CM | POA: Insufficient documentation

## 2012-09-18 DIAGNOSIS — I129 Hypertensive chronic kidney disease with stage 1 through stage 4 chronic kidney disease, or unspecified chronic kidney disease: Secondary | ICD-10-CM | POA: Insufficient documentation

## 2012-09-18 DIAGNOSIS — R197 Diarrhea, unspecified: Secondary | ICD-10-CM | POA: Insufficient documentation

## 2012-09-18 DIAGNOSIS — R143 Flatulence: Secondary | ICD-10-CM | POA: Insufficient documentation

## 2012-09-18 DIAGNOSIS — E785 Hyperlipidemia, unspecified: Secondary | ICD-10-CM | POA: Insufficient documentation

## 2012-09-18 DIAGNOSIS — R7401 Elevation of levels of liver transaminase levels: Secondary | ICD-10-CM | POA: Insufficient documentation

## 2012-09-18 DIAGNOSIS — E119 Type 2 diabetes mellitus without complications: Secondary | ICD-10-CM | POA: Insufficient documentation

## 2012-09-18 DIAGNOSIS — Z85038 Personal history of other malignant neoplasm of large intestine: Secondary | ICD-10-CM | POA: Insufficient documentation

## 2012-09-18 DIAGNOSIS — Z862 Personal history of diseases of the blood and blood-forming organs and certain disorders involving the immune mechanism: Secondary | ICD-10-CM | POA: Insufficient documentation

## 2012-09-18 DIAGNOSIS — R110 Nausea: Secondary | ICD-10-CM | POA: Insufficient documentation

## 2012-09-18 DIAGNOSIS — R109 Unspecified abdominal pain: Secondary | ICD-10-CM | POA: Insufficient documentation

## 2012-09-18 DIAGNOSIS — I2510 Atherosclerotic heart disease of native coronary artery without angina pectoris: Secondary | ICD-10-CM | POA: Insufficient documentation

## 2012-09-18 LAB — COMPREHENSIVE METABOLIC PANEL
BUN: 24 mg/dL — ABNORMAL HIGH (ref 6–23)
Calcium: 9.6 mg/dL (ref 8.4–10.5)
Creatinine, Ser: 1.56 mg/dL — ABNORMAL HIGH (ref 0.50–1.35)
GFR calc Af Amer: 47 mL/min — ABNORMAL LOW (ref >90)
Glucose, Bld: 139 mg/dL — ABNORMAL HIGH (ref 70–99)
Total Protein: 7 g/dL (ref 6.0–8.3)

## 2012-09-18 LAB — CBC WITH DIFFERENTIAL/PLATELET
Eosinophils Absolute: 0.3 10*3/uL (ref 0.0–0.7)
Eosinophils Relative: 4 % (ref 0–5)
Hemoglobin: 15.9 g/dL (ref 13.0–17.0)
Lymphs Abs: 2.5 10*3/uL (ref 0.7–4.0)
MCH: 32.2 pg (ref 26.0–34.0)
MCV: 91.1 fL (ref 78.0–100.0)
Monocytes Absolute: 0.7 10*3/uL (ref 0.1–1.0)
Monocytes Relative: 11 % (ref 3–12)
Platelets: 128 10*3/uL — ABNORMAL LOW (ref 150–400)
RBC: 4.94 MIL/uL (ref 4.22–5.81)

## 2012-09-18 LAB — URINALYSIS, ROUTINE W REFLEX MICROSCOPIC
Bilirubin Urine: NEGATIVE
Glucose, UA: 250 mg/dL — AB
Ketones, ur: NEGATIVE mg/dL
pH: 6.5 (ref 5.0–8.0)

## 2012-09-18 LAB — LIPASE, BLOOD: Lipase: 51 U/L (ref 11–59)

## 2012-09-18 LAB — OCCULT BLOOD, POC DEVICE: Fecal Occult Bld: NEGATIVE

## 2012-09-18 NOTE — ED Notes (Signed)
Pt from home with reports of intermittent, black, loose stool as well as right, lower abdominal pain that radiates to right flank area for about 1 1/2 weeks. Pt also reports nausea (wife reports that pt ate 12 inch sub for lunch) and headache. Pt reports that he was recently diagnosed with cirrhosis of the liver and was having the same type of abdominal pain but reports that he is now bleeding internally.

## 2012-09-18 NOTE — ED Provider Notes (Signed)
History     CSN: [id number]  Arrival date & time 09/18/12  1755   First MD Initiated Contact with Patient 09/18/12 2227      Chief Complaint  Patient presents with  . Nausea  . Diarrhea  . Abdominal Pain    right, lower abdomen that radiates to right flank area    (Consider location/radiation/quality/duration/timing/severity/associated sxs/prior treatment) HPI Comments: Pt complains of sudden onset diarrhea today that was black (which he interpreted to be bloody). He said it was followed by cramping of arms and legs. He came to the ER seeking treatment after that.  Patient is recently diagnosed with cirrhosis.  Patient is a 79 y.o. male presenting with diarrhea and abdominal pain. The history is provided by the patient.  Diarrhea The primary symptoms include abdominal pain, nausea, diarrhea, hematochezia and myalgias. Primary symptoms do not include fever, weight loss, fatigue, vomiting, melena, hematemesis, jaundice, dysuria or arthralgias. The illness began today. The onset was sudden. The problem has not changed since onset. The abdominal pain began today. The abdominal pain has been unchanged since its onset. The abdominal pain is generalized. The abdominal pain is relieved by nothing.  Nausea began today.  The diarrhea began today. The diarrhea is black and tarry.  The hematochezia began today. The hematochezia is a new problem.  The illness is also significant for anorexia, bloating and back pain. The illness does not include chills, dysphagia, odynophagia, constipation or tenesmus. Significant associated medical issues include liver disease. Associated medical issues do not include alcohol abuse or PUD.  Abdominal Pain The primary symptoms of the illness include abdominal pain, nausea, diarrhea and hematochezia. The primary symptoms of the illness do not include fever, fatigue, vomiting, hematemesis or dysuria.  Additional symptoms associated with the illness include anorexia and  back pain. Symptoms associated with the illness do not include chills, diaphoresis or constipation. Significant associated medical issues include liver disease. Significant associated medical issues do not include PUD.    Past Medical History  Diagnosis Date  . CAD (coronary artery disease)   . Hypertension   . Hyperlipidemia   . Renal insufficiency   . Peripheral edema   . GERD (gastroesophageal reflux disease)   . Diabetes mellitus, type 2   . Nonspecific elevation of levels of transaminase or lactic acid dehydrogenase (LDH)   . Headache   . Incisional hernia   . Oral aphthae   . Anxiety and depression   . BPH (benign prostatic hyperplasia)   . IBS (irritable bowel syndrome)   . Iron deficiency anemia   . Tubular adenoma of colon 10/1993  . Thrombocytopenia   . Hearing loss   . Diverticulosis   . Hemorrhoids   . Colon cancer 1985    arising from a rectal polyp  . Peripheral neuropathy 08/11/2011  . RLS (restless legs syndrome) 08/11/2011  . Postural dizziness   . Cirrhosis   . Portal venous hypertension   . CKD (chronic kidney disease) 08/15/2012  . Hepatic cirrhosis 08/15/2012    Past Surgical History  Procedure Date  . Ptca     stents  . Appendectomy   . Tonsillectomy   . Hernia repair   . Cholecystectomy 01/2008  . Hand surgery     bilateral  . Intraocular lens insertion   . Neck surgery     gland removed  . Esophagogastroduodenoscopy 07/02/2012    Procedure: ESOPHAGOGASTRODUODENOSCOPY (EGD);  Surgeon: Meryl Dare, MD,FACG;  Location: [facility];  Service: Endoscopy;  Laterality: N/A;  Family History  Problem Relation Age of Onset  . Heart disease Mother     Brothers x 2  . Diabetes    . Hypertension      History  Substance Use Topics  . Smoking status: Former Smoker    Types: Cigarettes    Quit date: 09/05/1958  . Smokeless tobacco: Never Used  . Alcohol Use: 0.0 oz/week     Comment: 1 beer once a month, reports drank heavily about 40  years ago      Review of Systems  Constitutional: Positive for appetite change. Negative for fever, chills, weight loss, diaphoresis and fatigue.  HENT: Negative.   Eyes: Negative.   Respiratory: Negative.   Cardiovascular: Negative.   Gastrointestinal: Positive for nausea, abdominal pain, diarrhea, blood in stool, hematochezia, abdominal distention, bloating and anorexia. Negative for dysphagia, vomiting, constipation, melena, anal bleeding, rectal pain, hematemesis and jaundice.       Pt complains of blood in stool but none was found on stool guaiac card.  Pt states that he also felt nausea earlier and that his bowel movements were loose.  Genitourinary: Positive for decreased urine volume and difficulty urinating. Negative for dysuria.       Pt states that he feels like "the right side isn't draining as much as the left", with the onset being immediately after diagnosis with cirrhosis.  Musculoskeletal: Positive for myalgias and back pain. Negative for arthralgias.       Pt states that his back hurts on the right side and states that he has been told it is due to the enlargement of his cirrhotic liver "pushing on his back muscles"  Skin: Negative for pallor.  Neurological: Negative.   Psychiatric/Behavioral: Negative.   All other systems reviewed and are negative.    Allergies  Dutasteride and Procaine hcl  Home Medications   Current Outpatient Rx  Name  Route  Sig  Dispense  Refill  . HYDROCODONE-IBUPROFEN 5-200 MG PO TABS   Oral   Take 1 tablet by mouth every 4 (four) hours as needed. Pain           BP 137/66  Pulse 72  Temp 98 F (36.7 C) (Oral)  SpO2 96%  Physical Exam  Nursing note and vitals reviewed. Constitutional: He is oriented to person, place, and time. He appears well-developed and well-nourished. No distress.  HENT:  Head: Normocephalic and atraumatic.  Eyes: Conjunctivae normal and EOM are normal. Pupils are equal, round, and reactive to light. No  scleral icterus.  Neck: Normal range of motion.  Cardiovascular: Normal rate, regular rhythm and normal heart sounds.   Pulmonary/Chest: Effort normal and breath sounds normal.  Abdominal: Bowel sounds are normal. He exhibits distension. There is tenderness.       Liver borders were appreciable and the patient was tender to palpation throughout abdomen.  Abdomen is distended.  Genitourinary: Rectum normal and prostate normal. Guaiac negative stool.       Stool guaiac performed with rectal exam; found to be negative although patient complains of blood in stool as of today.  Musculoskeletal: Normal range of motion. He exhibits no edema and no tenderness.  Neurological: He is alert and oriented to person, place, and time. No cranial nerve deficit.  Skin: Skin is warm and dry. No rash noted. He is not diaphoretic. No erythema.  Psychiatric: He has a normal mood and affect. His behavior is normal.    ED Course  Procedures (including critical care time)  Labs Reviewed  CBC WITH DIFFERENTIAL - Abnormal; Notable for the following:    Platelets 128 (*)     All other components within normal limits  COMPREHENSIVE METABOLIC PANEL - Abnormal; Notable for the following:    Glucose, Bld 139 (*)     BUN 24 (*)     Creatinine, Ser 1.56 (*)     Total Bilirubin 2.6 (*)     GFR calc non Af Amer 40 (*)     GFR calc Af Amer 47 (*)     All other components within normal limits  URINALYSIS, ROUTINE W REFLEX MICROSCOPIC - Abnormal; Notable for the following:    Glucose, UA 250 (*)     All other components within normal limits  LIPASE, BLOOD  OCCULT BLOOD, POC DEVICE  PROTIME-INR   No results found.   No diagnosis found.    MDM   Pts labs reviewed and within his baseline.  DC'd home with instructions to follow up with his primary care physician by phone tomorrow.      Arman Filter, NP 09/19/12 [phone]

## 2012-09-20 ENCOUNTER — Encounter (HOSPITAL_COMMUNITY): Payer: Self-pay | Admitting: *Deleted

## 2012-09-20 ENCOUNTER — Observation Stay (HOSPITAL_COMMUNITY)
Admission: EM | Admit: 2012-09-20 | Discharge: 2012-09-21 | Disposition: A | Discharge status: Home / Self Care | Payer: Medicare Other | Attending: Internal Medicine | Admitting: Internal Medicine

## 2012-09-20 DIAGNOSIS — B1910 Unspecified viral hepatitis B without hepatic coma: Secondary | ICD-10-CM | POA: Insufficient documentation

## 2012-09-20 DIAGNOSIS — R109 Unspecified abdominal pain: Secondary | ICD-10-CM

## 2012-09-20 DIAGNOSIS — Z8619 Personal history of other infectious and parasitic diseases: Secondary | ICD-10-CM | POA: Diagnosis present

## 2012-09-20 DIAGNOSIS — K922 Gastrointestinal hemorrhage, unspecified: Secondary | ICD-10-CM

## 2012-09-20 DIAGNOSIS — F411 Generalized anxiety disorder: Secondary | ICD-10-CM | POA: Insufficient documentation

## 2012-09-20 DIAGNOSIS — K921 Melena: Secondary | ICD-10-CM | POA: Diagnosis present

## 2012-09-20 DIAGNOSIS — Z85038 Personal history of other malignant neoplasm of large intestine: Secondary | ICD-10-CM | POA: Insufficient documentation

## 2012-09-20 DIAGNOSIS — I129 Hypertensive chronic kidney disease with stage 1 through stage 4 chronic kidney disease, or unspecified chronic kidney disease: Secondary | ICD-10-CM | POA: Insufficient documentation

## 2012-09-20 DIAGNOSIS — I2510 Atherosclerotic heart disease of native coronary artery without angina pectoris: Secondary | ICD-10-CM | POA: Insufficient documentation

## 2012-09-20 DIAGNOSIS — R195 Other fecal abnormalities: Principal | ICD-10-CM | POA: Insufficient documentation

## 2012-09-20 DIAGNOSIS — E785 Hyperlipidemia, unspecified: Secondary | ICD-10-CM | POA: Insufficient documentation

## 2012-09-20 DIAGNOSIS — K7460 Unspecified cirrhosis of liver: Secondary | ICD-10-CM | POA: Insufficient documentation

## 2012-09-20 DIAGNOSIS — N189 Chronic kidney disease, unspecified: Secondary | ICD-10-CM | POA: Insufficient documentation

## 2012-09-20 DIAGNOSIS — E119 Type 2 diabetes mellitus without complications: Secondary | ICD-10-CM | POA: Insufficient documentation

## 2012-09-20 DIAGNOSIS — K219 Gastro-esophageal reflux disease without esophagitis: Secondary | ICD-10-CM | POA: Insufficient documentation

## 2012-09-20 MED ORDER — ONDANSETRON HCL 4 MG/2ML IJ SOLN
4.0000 mg | Freq: Once | INTRAMUSCULAR | Status: AC
  Administered 2012-09-21: 4 mg via INTRAVENOUS
  Filled 2012-09-20: qty 2

## 2012-09-20 MED ORDER — PANTOPRAZOLE SODIUM 40 MG IV SOLR
40.0000 mg | INTRAVENOUS | Status: AC
  Administered 2012-09-21: 40 mg via INTRAVENOUS
  Filled 2012-09-20: qty 40

## 2012-09-20 NOTE — ED Provider Notes (Signed)
History     CSN: [id number]  Arrival date & time 09/20/12  2320   First MD Initiated Contact with Patient 09/20/12 2345      Chief Complaint  Patient presents with  . Nausea  . Rectal Bleeding    (Consider location/radiation/quality/duration/timing/severity/associated sxs/prior treatment) HPI Comments: Alex Higgins was seen in the ER on 1/14 for evaluation of abdominal pain.  He reports the discomfort has not resolved.  Today, he developed loose, black stools.  He states he is bleeding from his rectum/.  He denies using any blood thinners or anticoagulants.  He denies any hx of GI bleeding or usage of pepto bismol.  He reports feeling nauseated with persistent intermittent cramping.  Patient is a 79 y.o. male presenting with abdominal pain. The history is provided by the patient. No language interpreter was used.  Abdominal Pain The primary symptoms of the illness include abdominal pain, fatigue, nausea and diarrhea. The primary symptoms of the illness do not include fever, shortness of breath or vomiting. The current episode started 6 to 12 hours ago. The onset of the illness was sudden. The problem has not changed since onset. The patient states that she believes she is currently not pregnant. The patient has had a change in bowel habit (today, notes loose, black stools). Risk factors for an acute abdominal problem include being elderly and a history of abdominal surgery (appendectomy many years ago). Additional symptoms associated with the illness include back pain (chronic). Symptoms associated with the illness do not include chills, anorexia, diaphoresis, heartburn, constipation, urgency, hematuria or frequency. Significant associated medical issues include liver disease.    Past Medical History  Diagnosis Date  . CAD (coronary artery disease)   . Hypertension   . Hyperlipidemia   . Renal insufficiency   . Peripheral edema   . GERD (gastroesophageal reflux disease)   . Diabetes  mellitus, type 2   . Nonspecific elevation of levels of transaminase or lactic acid dehydrogenase (LDH)   . Headache   . Incisional hernia   . Oral aphthae   . Anxiety and depression   . BPH (benign prostatic hyperplasia)   . IBS (irritable bowel syndrome)   . Iron deficiency anemia   . Tubular adenoma of colon 10/1993  . Thrombocytopenia   . Hearing loss   . Diverticulosis   . Hemorrhoids   . Colon cancer 1985    arising from a rectal polyp  . Peripheral neuropathy 08/11/2011  . RLS (restless legs syndrome) 08/11/2011  . Postural dizziness   . Cirrhosis   . Portal venous hypertension   . CKD (chronic kidney disease) 08/15/2012  . Hepatic cirrhosis 08/15/2012    Past Surgical History  Procedure Date  . Ptca     stents  . Appendectomy   . Tonsillectomy   . Hernia repair   . Cholecystectomy 01/2008  . Hand surgery     bilateral  . Intraocular lens insertion   . Neck surgery     gland removed  . Esophagogastroduodenoscopy 07/02/2012    Procedure: ESOPHAGOGASTRODUODENOSCOPY (EGD);  Surgeon: Meryl Dare, MD,FACG;  Location: [facility];  Service: Endoscopy;  Laterality: N/A;    Family History  Problem Relation Age of Onset  . Heart disease Mother     Brothers x 2  . Diabetes    . Hypertension      History  Substance Use Topics  . Smoking status: Former Smoker    Types: Cigarettes    Quit date: 09/05/1958  .  Smokeless tobacco: Never Used  . Alcohol Use: 0.0 oz/week     Comment: 1 beer once a month, reports drank heavily about 40 years ago      Review of Systems  Constitutional: Positive for fatigue. Negative for fever, chills and diaphoresis.  HENT: Negative.   Eyes: Negative for visual disturbance.  Respiratory: Negative for cough, chest tightness and shortness of breath.   Cardiovascular: Negative for chest pain, palpitations and leg swelling.  Gastrointestinal: Positive for nausea, abdominal pain, diarrhea and blood in stool. Negative for heartburn,  vomiting, constipation and anorexia.  Genitourinary: Negative for urgency, frequency and hematuria.  Musculoskeletal: Positive for back pain (chronic).  Skin: Negative for pallor and wound.  Neurological: Negative for dizziness, speech difficulty, light-headedness and headaches.  Hematological: Does not bruise/bleed easily.    Allergies  Dutasteride and Novocain  Home Medications   Current Outpatient Rx  Name  Route  Sig  Dispense  Refill  . HYDROCODONE-IBUPROFEN 5-200 MG PO TABS   Oral   Take 1 tablet by mouth every 4 (four) hours as needed. Pain         . OMEPRAZOLE 20 MG PO CPDR   Oral   Take 20 mg by mouth daily as needed. For acid reflux           There were no vitals taken for this visit.  Physical Exam  Nursing note and vitals reviewed. Constitutional: He is oriented to person, place, and time. He appears well-developed and well-nourished. No distress.  HENT:  Head: Normocephalic and atraumatic.  Right Ear: External ear normal.  Nose: Nose normal.  Mouth/Throat: Oropharynx is clear and moist. No oropharyngeal exudate.  Eyes: Conjunctivae normal are normal. Pupils are equal, round, and reactive to light. Right eye exhibits no discharge. Left eye exhibits no discharge. No scleral icterus.  Neck: Normal range of motion. Neck supple. No JVD present. No tracheal deviation present.  Cardiovascular: Normal rate, regular rhythm, normal heart sounds and intact distal pulses.  Exam reveals no gallop and no friction rub.   No murmur heard. Pulmonary/Chest: Breath sounds normal. No stridor. No respiratory distress. He has no wheezes. He has no rales. He exhibits no tenderness.  Abdominal: Soft. Bowel sounds are normal. He exhibits no distension, no abdominal bruit, no ascites, no pulsatile midline mass and no mass. There is no hepatosplenomegaly. There is generalized tenderness (very mild diffuse). There is no rigidity, no rebound, no guarding, no CVA tenderness, no tenderness  at McBurney's point and negative Murphy's sign. No hernia.  Musculoskeletal: Normal range of motion. He exhibits edema (trace). He exhibits no tenderness.  Lymphadenopathy:    He has no cervical adenopathy.  Neurological: He is alert and oriented to person, place, and time. No cranial nerve deficit.  Skin: Skin is warm and dry. No rash noted. He is not diaphoretic. No erythema. No pallor.  Psychiatric: He has a normal mood and affect. His behavior is normal.    ED Course  Procedures (including critical care time)   Labs Reviewed  CBC  COMPREHENSIVE METABOLIC PANEL  PROTIME-INR  APTT  TYPE AND SCREEN   No results found.   No diagnosis found.    MDM  Pt presents for evaluation of black stools, abdominal discomfort, and nausea.  He was seen in the ER 2 days ago for evaluation of abdominal pain.  His work-up however did not reveal any source/cause.  He currently appears comfortable, note no evidence of peritonitis on exam, NAD.  Will perform a  rectal exam and obtain a stool sample for fecal occult blood testing.  Will also obtain basic labs, LFT's, and coags.  Will administer zofran, for treatment of ongoing nausea, and protonix.  Will reassess as results become available.  He reports recently being diagnosed with cirrhosis.  0240.  Pt stable, NAD.  Stool is positive for occult blood.  He appears comfortable.  There have been no bloody or melanotic stools herein the ER.  He is not anemic.  Discussed his evaluation with Dr. Onalee Hua (hospitalist).  Plan admit for further evaluation.      Tobin Chad, MD 09/21/12 [phone]

## 2012-09-20 NOTE — ED Notes (Signed)
Patient is alert and oriented x3.  He was seen in [facility] 2 days ago.   He has returned for similar complaints of nausea with additional complaints of Rectal bleeding.

## 2012-09-21 ENCOUNTER — Emergency Department (HOSPITAL_COMMUNITY): Payer: Medicare Other

## 2012-09-21 DIAGNOSIS — R195 Other fecal abnormalities: Principal | ICD-10-CM

## 2012-09-21 DIAGNOSIS — R109 Unspecified abdominal pain: Secondary | ICD-10-CM

## 2012-09-21 DIAGNOSIS — F411 Generalized anxiety disorder: Secondary | ICD-10-CM

## 2012-09-21 DIAGNOSIS — K7460 Unspecified cirrhosis of liver: Secondary | ICD-10-CM

## 2012-09-21 DIAGNOSIS — K921 Melena: Secondary | ICD-10-CM

## 2012-09-21 LAB — CBC
HCT: 44.2 % (ref 39.0–52.0)
Hemoglobin: 16.1 g/dL (ref 13.0–17.0)
WBC: 7.3 10*3/uL (ref 4.0–10.5)

## 2012-09-21 LAB — HEMOGLOBIN AND HEMATOCRIT, BLOOD
HCT: 41.6 % (ref 39.0–52.0)
HCT: 41.1 % (ref 39.0–52.0)
Hemoglobin: 14.6 g/dL (ref 13.0–17.0)
Hemoglobin: 14.5 g/dL (ref 13.0–17.0)

## 2012-09-21 LAB — COMPREHENSIVE METABOLIC PANEL
Alkaline Phosphatase: 86 U/L (ref 39–117)
BUN: 15 mg/dL (ref 6–23)
CO2: 27 mEq/L (ref 19–32)
Chloride: 104 mEq/L (ref 96–112)
GFR calc Af Amer: 47 mL/min — ABNORMAL LOW (ref >90)
Glucose, Bld: 152 mg/dL — ABNORMAL HIGH (ref 70–99)
Potassium: 4.4 mEq/L (ref 3.5–5.1)
Total Bilirubin: 2 mg/dL — ABNORMAL HIGH (ref 0.3–1.2)

## 2012-09-21 LAB — PROTIME-INR: Prothrombin Time: 13.2 seconds (ref 11.6–15.2)

## 2012-09-21 LAB — TYPE AND SCREEN: Antibody Screen: NEGATIVE

## 2012-09-21 MED ORDER — ONDANSETRON 4 MG PO TBDP: 4.0000 mg | ORAL_TABLET | Freq: Three times a day (TID) | ORAL | Status: DC | PRN

## 2012-09-21 MED ORDER — SODIUM CHLORIDE 0.9 % IJ SOLN: 3.0000 mL | Freq: Two times a day (BID) | INTRAMUSCULAR | Status: DC

## 2012-09-21 MED ORDER — SODIUM CHLORIDE 0.9 % IJ SOLN: 3.0000 mL | INTRAMUSCULAR | Status: DC | PRN

## 2012-09-21 MED ORDER — ONDANSETRON HCL 4 MG/2ML IJ SOLN
4.0000 mg | Freq: Four times a day (QID) | INTRAMUSCULAR | Status: DC | PRN
  Administered 2012-09-21 (×2): 4 mg via INTRAVENOUS
  Filled 2012-09-21 (×2): qty 2

## 2012-09-21 MED ORDER — ONDANSETRON HCL 4 MG PO TABS: 4.0000 mg | ORAL_TABLET | Freq: Four times a day (QID) | ORAL | Status: DC | PRN

## 2012-09-21 MED ORDER — SODIUM CHLORIDE 0.9 % IV SOLN
250.0000 mL | INTRAVENOUS | Status: DC | PRN
  Administered 2012-09-21: 250 mL via INTRAVENOUS

## 2012-09-21 NOTE — ED Provider Notes (Signed)
Medical screening examination/treatment/procedure(s) were conducted as a shared visit with non-physician practitioner(s) and myself.  I personally evaluated the patient during the encounter.  Available data set reviewed and disposition determined according to results of workup and examination.  Gilda Crease, MD 09/21/12 1505

## 2012-09-21 NOTE — Progress Notes (Signed)
DC instructions gone over with patient and wife.  Patient verbalized understanding, no questions or concerns at this time.  Patient walked to car accompanied by wife and nurse tech.  Rx for Zofran given.

## 2012-09-21 NOTE — Progress Notes (Signed)
Pt admitted from the ED with nausea and loose stools. Pt voided prior to arrival. Pt orientated to the unit and viewed the safety video. Vital signs stable. Pt does not complain of pain but does complain of nausea. Pt given Zofran 4mg  IV per MD order. Pt up with assist bed alarm on. Pt has no questions or concerns.

## 2012-09-21 NOTE — H&P (Signed)
PCP:   Oliver Barre, MD   Chief Complaint:  Dark stool  HPI: 79 yo male with chronic abd pain for months who is undergoing outpt w/u for cirrhosis of the liver as etiology of his pain comes in with report of melena and one episode of nonbloody vomit.  No fevers.  Not usually constipated.  No brbpr.  On rectal exam by edp minimal amt of brown stool but was heme positive.  hgb is above 15.  Asked to admit for possible gib.  Pt symtpoms have resolved since in the ED and is ready to go home.  Review of Systems:  O/w neg  Past Medical History: Past Medical History  Diagnosis Date  . CAD (coronary artery disease)   . Hypertension   . Hyperlipidemia   . Renal insufficiency   . Peripheral edema   . GERD (gastroesophageal reflux disease)   . Diabetes mellitus, type 2   . Nonspecific elevation of levels of transaminase or lactic acid dehydrogenase (LDH)   . Headache   . Incisional hernia   . Oral aphthae   . Anxiety and depression   . BPH (benign prostatic hyperplasia)   . IBS (irritable bowel syndrome)   . Iron deficiency anemia   . Tubular adenoma of colon 10/1993  . Thrombocytopenia   . Hearing loss   . Diverticulosis   . Hemorrhoids   . Colon cancer 1985    arising from a rectal polyp  . Peripheral neuropathy 08/11/2011  . RLS (restless legs syndrome) 08/11/2011  . Postural dizziness   . Cirrhosis   . Portal venous hypertension   . CKD (chronic kidney disease) 08/15/2012  . Hepatic cirrhosis 08/15/2012   Past Surgical History  Procedure Date  . Ptca     stents  . Appendectomy   . Tonsillectomy   . Hernia repair   . Cholecystectomy 01/2008  . Hand surgery     bilateral  . Intraocular lens insertion   . Neck surgery     gland removed  . Esophagogastroduodenoscopy 07/02/2012    Procedure: ESOPHAGOGASTRODUODENOSCOPY (EGD);  Surgeon: Meryl Dare, MD,FACG;  Location: [facility];  Service: Endoscopy;  Laterality: N/A;    Medications: Prior to Admission medications    Medication Sig Start Date End Date Taking? Authorizing Provider  hydrocodone-ibuprofen (VICOPROFEN) 5-200 MG per tablet Take 1 tablet by mouth every 4 (four) hours as needed. Pain 09/06/12  Yes Nicki Reaper, NP  omeprazole (PRILOSEC) 20 MG capsule Take 20 mg by mouth daily as needed. For acid reflux   Yes Historical Provider, MD    Allergies:   Allergies  Allergen Reactions  . Dutasteride Other (See Comments)    unknown  . Novocain (Procaine) Other (See Comments)    Unknown reaction    Social History:  reports that he quit smoking about 54 years ago. His smoking use included Cigarettes. He has never used smokeless tobacco. He reports that he drinks alcohol. He reports that he does not use illicit drugs.  Family History: Family History  Problem Relation Age of Onset  . Heart disease Mother     Brothers x 2  . Diabetes    . Hypertension      Physical Exam: Filed Vitals:   09/21/12 0016 09/21/12 0017 09/21/12 0018  BP: 142/81 135/69 131/61  Pulse: 70 72 73  Temp: 99.3 F (37.4 C)    TempSrc: Oral    Resp: 20    SpO2: 97%     General appearance: alert,  cooperative and no distress Neck: no JVD and supple, symmetrical, trachea midline Lungs: clear to auscultation bilaterally Heart: regular rate and rhythm, S1, S2 normal, no murmur, click, rub or gallop Abdomen: soft, non-tender; bowel sounds normal; no masses,  no organomegaly Extremities: extremities normal, atraumatic, no cyanosis or edema Pulses: 2+ and symmetric Skin: Skin color, texture, turgor normal. No rashes or lesions Neurologic: Grossly normal    Labs on Admission:   [facility] 09/20/12 0005 09/18/12 1915  NA 140 139  K 4.4 4.1  CL 104 102  CO2 27 27  GLUCOSE 152* 139*  BUN 15 24*  CREATININE 1.56* 1.56*  CALCIUM 9.5 9.6  MG -- --  PHOS -- --    Basename 09/20/12 0005 09/18/12 1915  AST 29 31  ALT 26 23  ALKPHOS 86 87  BILITOT 2.0* 2.6*  PROT 6.7 7.0  ALBUMIN 3.7 3.8    Basename 09/18/12  1915  LIPASE 51  AMYLASE --    Basename 09/20/12 0005 09/18/12 1915  WBC 7.3 6.1  NEUTROABS -- 2.7  HGB 16.1 15.9  HCT 44.2 45.0  MCV 90.9 91.1  PLT 122* 128*   Radiological Exams on Admission: No results found.  Assessment/Plan 79 yo male with hem pos stool and report of melena with hbg over 16  Principal Problem:  *Heme positive stool Active Problems:  ANXIETY  PERSONAL HX COLON CANCER s/p polypectomy only  HEPATITIS B, HX OF  CKD (chronic kidney disease)  Hepatic cirrhosis  Melena  Repeat h/h in 6 hours or so.  If pt remains asymptomatic and his hgb remains stable and there is no further evidence of bleeding issues, he wishes to be discharged later today with continued outpt work up of his possible cirrhosis and gi issues.    Edynn Gillock A 09/21/2012, 2:30 AM

## 2012-09-21 NOTE — ED Notes (Signed)
POCT OCCULT BLOOD STOOL resulted by EDP M. Powers. POSITIVE.

## 2012-09-21 NOTE — Discharge Summary (Signed)
[facility]  Physician Discharge Summary   Patient ID: Alex Higgins MRN: [id number] DOB/AGE: [birth year] 77 y.o.  Admit date: 09/20/2012 Discharge date: 09/21/2012  PCP: Oliver Barre, MD  DISCHARGE DIAGNOSES:  Principal Problem:  *Heme positive stool Active Problems:  ANXIETY  PERSONAL HX COLON CANCER  HEPATITIS B, HX OF  CKD (chronic kidney disease)  Hepatic cirrhosis  Melena   RECOMMENDATIONS FOR OUTPATIENT FOLLOW UP: 1. Needs to see Dr. Russella Dar for heme positive stool  DISCHARGE CONDITION: fair  Diet recommendation: Parke Simmers diet  Filed Weights   09/21/12 0425  Weight: 86.546 kg (190 lb 12.8 oz)    INITIAL HISTORY: 79 yo male with chronic abd pain for months who is undergoing outpt w/u for cirrhosis of the liver as etiology of his pain presented with report of dark stool and one episode of nonbloody vomit. No fevers. Not usually constipated. No brbpr. On rectal exam by edp minimal amt of brown stool but was heme positive. hgb is above 15. Asked to admit for possible gib. Pt symtpoms resolved since in the ED.  Consultations:  None  Procedures:  None  HOSPITAL COURSE:   Patient reported 'dark' stool but it was brown on rectal examination. It was however heme positive. He was observed overnight and his hemoglobin was checked periodically. Hemoglobin is stable. He hasn't had further episodes of 'dark' stool or bloody stool. Unclear why has heme positive stools. He does have a history of colon cancer but has had colonoscopy as recently as 2012. There is no evidence for upper gi bleeding at this time. It will safe for him to follow up with GI at this time.  He was also nauseas last night. He was given zofran with relief. He tolerated a bland diet. He feels better.   He does have right sided abdominal pain which per previous records is chronic. He has been seen by GI within the last few months and underwent upper endoscopy which revealed portal gastropathy. CT  suggested liver cirrhosis. He has an upcoming appointment with Dr. Russella Dar on Monday. His abdomen is benign currently. AAS was normal. No indication for CT.  Since his hemoglobin is stable and he is not having further episodes of nausea he is stable for discharge. He is on a PPI which he should continue. He can follow up with his gastroenterologist. Patient is agreeable with the plan.   PERTINENT LABS:  The results of significant diagnostics from this hospitalization (including imaging, microbiology, ancillary and laboratory) are listed below for reference.     Labs: Basic Metabolic Panel:  Lab 09/20/12 4098 09/18/12 1915  NA 140 139  K 4.4 4.1  CL 104 102  CO2 27 27  GLUCOSE 152* 139*  BUN 15 24*  CREATININE 1.56* 1.56*  CALCIUM 9.5 9.6  MG -- --  PHOS -- --   Liver Function Tests:  Lab 09/20/12 0005 09/18/12 1915  AST 29 31  ALT 26 23  ALKPHOS 86 87  BILITOT 2.0* 2.6*  PROT 6.7 7.0  ALBUMIN 3.7 3.8    Lab 09/18/12 1915  LIPASE 51  AMYLASE --   CBC:  Lab 09/21/12 1030 09/21/12 0522 09/20/12 0005 09/18/12 1915  WBC -- -- 7.3 6.1  NEUTROABS -- -- -- 2.7  HGB 14.5 14.6 16.1 15.9  HCT 41.1 41.6 44.2 45.0  MCV -- -- 90.9 91.1  PLT -- -- 122* 128*    IMAGING STUDIES Dg Abd 1 View  09/21/2012  *RADIOLOGY REPORT*  Clinical Data: Nausea  and rectal bleeding.  Right-sided abdominal pain.  ABDOMEN - 1 VIEW  Comparison: CT abdomen and pelvis 06/13/2012.  Findings: Surgical clips in the right upper quadrant.  Scattered gas and stool in the colon with scattered gas filled small bowel loops.  No small or large bowel distension.  No radiopaque stones. Calcified phleboliths in the pelvis.  Degenerative changes in the spine.  Vascular calcifications.  IMPRESSION: Nonobstructive bowel gas pattern.   Original Report Authenticated By: Burman Nieves, M.D.     DISCHARGE EXAMINATION: Filed Vitals:   09/21/12 0018 09/21/12 0425 09/21/12 0552 09/21/12 0600  BP: 131/61 136/74 114/58     Pulse: 73 63  63  Temp:  98.4 F (36.9 C)  98.6 F (37 C)  TempSrc:  Oral  Oral  Resp:  18  18  Height:  6\' 1"  (1.854 m)    Weight:  86.546 kg (190 lb 12.8 oz)    SpO2:  95%  93%   General appearance: alert, cooperative, appears stated age and no distress Head: Normocephalic, without obvious abnormality, atraumatic Resp: clear to auscultation bilaterally Cardio: regular rate and rhythm, S1, S2 normal, no murmur, click, rub or gallop GI: soft, tender in right side.; bowel sounds normal; no masses,  no organomegaly Extremities: extremities normal, atraumatic, no cyanosis or edema Neurologic: Alert and oriented x 3. No focal deficits.  DISPOSITION: Home  Discharge Orders    Future Appointments: Provider: Department: Dept Phone: Center:   09/24/2012 8:00 AM Corwin Levins, MD [facility] [phone] [facility]   09/24/2012 2:00 PM Meryl Dare, MD,FACG [facility] [phone] [facility]     Current Discharge Medication List    START taking these medications   Details  ondansetron (ZOFRAN-ODT) 4 MG disintegrating tablet Take 1 tablet (4 mg total) by mouth every 8 (eight) hours as needed for nausea. Qty: 20 tablet, Refills: 0      CONTINUE these medications which have NOT CHANGED   Details  hydrocodone-ibuprofen (VICOPROFEN) 5-200 MG per tablet Take 1 tablet by mouth every 4 (four) hours as needed. Pain    omeprazole (PRILOSEC) 20 MG capsule Take 20 mg by mouth daily as needed. For acid reflux       Follow-up Information    Schedule an appointment as soon as possible for a visit with Judie Petit T. Russella Dar, MD,FACG. (for further problems with nausea and abdominal pain)    Contact information:   [address] [place] [address] [phone]       Call Oliver Barre, MD. (As needed)    Contact information:   [address] [place]walbe [place] [address] [phone]           TOTAL DISCHARGE TIME: 35 mins  [facility]  [facility] Pager [phone]  09/21/2012, 11:28 AM

## 2012-09-24 ENCOUNTER — Ambulatory Visit (INDEPENDENT_AMBULATORY_CARE_PROVIDER_SITE_OTHER): Payer: Medicare Other | Admitting: Internal Medicine

## 2012-09-24 ENCOUNTER — Ambulatory Visit (INDEPENDENT_AMBULATORY_CARE_PROVIDER_SITE_OTHER): Payer: Medicare Other | Admitting: Gastroenterology

## 2012-09-24 ENCOUNTER — Encounter: Payer: Self-pay | Admitting: Internal Medicine

## 2012-09-24 ENCOUNTER — Encounter: Payer: Self-pay | Admitting: Gastroenterology

## 2012-09-24 VITALS — BP 122/72 | HR 83 | Temp 98.2°F | Ht 73.0 in | Wt 195.2 lb

## 2012-09-24 VITALS — BP 134/76 | HR 80 | Ht 73.0 in | Wt 195.0 lb

## 2012-09-24 DIAGNOSIS — R109 Unspecified abdominal pain: Secondary | ICD-10-CM

## 2012-09-24 DIAGNOSIS — K5900 Constipation, unspecified: Secondary | ICD-10-CM

## 2012-09-24 DIAGNOSIS — R195 Other fecal abnormalities: Secondary | ICD-10-CM

## 2012-09-24 DIAGNOSIS — E119 Type 2 diabetes mellitus without complications: Secondary | ICD-10-CM

## 2012-09-24 DIAGNOSIS — Z0000 Encounter for general adult medical examination without abnormal findings: Secondary | ICD-10-CM

## 2012-09-24 DIAGNOSIS — N400 Enlarged prostate without lower urinary tract symptoms: Secondary | ICD-10-CM

## 2012-09-24 DIAGNOSIS — K7030 Alcoholic cirrhosis of liver without ascites: Secondary | ICD-10-CM

## 2012-09-24 HISTORY — DX: Benign prostatic hyperplasia without lower urinary tract symptoms: N40.0

## 2012-09-24 MED ORDER — FINASTERIDE 5 MG PO TABS: 5.0000 mg | ORAL_TABLET | Freq: Every day | ORAL | Status: DC

## 2012-09-24 NOTE — Progress Notes (Signed)
Subjective:    Patient ID: Alex Higgins, male    DOB: Oct 01, 1929, 79 y.o.   MRN: [id number]  HPI   Here to f/u recent being seen twice in the ER with nausea upper abd pain , the second time with heme pos stool, observed overnight, hgb stable, and pain improved/nausea better with med, d/c home jan 17, appetite low for the past 1-2 mo, lost wt overall from 205 to 190 per pt (202 to 195 documented since dec 11), no vomiting since hosp, zofran helps the nausea, has been taking the prilosec, but not the narcotic (only takes 1-2 times per day as needed);  Does take also alleve otc as needed, usually once per day.  No further dizzy since hospn, was quite a bit before, doesn't remember being transported to the ER.  Due to see Dr Russella Dar later today.  Did have Ct ABd oct 2013 - no recurrent malignancy,  EGD with portal Hypertensive gastropathy.  No PUD or varices apparently oct 2013.  No ETOH.  Last colon July 2012.  Pt frustrated about his recent symtpom.  No overt bleeding or bruising since. Stopped his finasteride due to his overall frustration, now with some increased prostatim and lower abd tender Past Medical History  Diagnosis Date  . CAD (coronary artery disease)   . Hypertension   . Hyperlipidemia   . Renal insufficiency   . Peripheral edema   . GERD (gastroesophageal reflux disease)   . Diabetes mellitus, type 2   . Nonspecific elevation of levels of transaminase or lactic acid dehydrogenase (LDH)   . Headache   . Incisional hernia   . Oral aphthae   . Anxiety and depression   . BPH (benign prostatic hyperplasia)   . IBS (irritable bowel syndrome)   . Iron deficiency anemia   . Tubular adenoma of colon 10/1993  . Thrombocytopenia   . Hearing loss   . Diverticulosis   . Hemorrhoids   . Colon cancer 1985    arising from a rectal polyp  . Peripheral neuropathy 08/11/2011  . RLS (restless legs syndrome) 08/11/2011  . Postural dizziness   . Cirrhosis   . Portal venous hypertension   .  CKD (chronic kidney disease) 08/15/2012  . Hepatic cirrhosis 08/15/2012   Past Surgical History  Procedure Date  . Ptca     stents  . Appendectomy   . Tonsillectomy   . Hernia repair   . Cholecystectomy 01/2008  . Hand surgery     bilateral  . Intraocular lens insertion   . Neck surgery     gland removed  . Esophagogastroduodenoscopy 07/02/2012    Procedure: ESOPHAGOGASTRODUODENOSCOPY (EGD);  Surgeon: Meryl Dare, MD,FACG;  Location: [facility];  Service: Endoscopy;  Laterality: N/A;    reports that he quit smoking about 54 years ago. His smoking use included Cigarettes. He has never used smokeless tobacco. He reports that he drinks alcohol. He reports that he does not use illicit drugs. family history includes Diabetes in an unspecified family member; Heart disease in his mother; and Hypertension in an unspecified family member. Allergies  Allergen Reactions  . Dutasteride Other (See Comments)    unknown  . Novocain (Procaine) Other (See Comments)    Unknown reaction   Current Outpatient Prescriptions on File Prior to Visit  Medication Sig Dispense Refill  . hydrocodone-ibuprofen (VICOPROFEN) 5-200 MG per tablet Take 1 tablet by mouth every 4 (four) hours as needed. Pain      . omeprazole (PRILOSEC)  20 MG capsule Take 20 mg by mouth daily as needed. For acid reflux      . ondansetron (ZOFRAN-ODT) 4 MG disintegrating tablet Take 1 tablet (4 mg total) by mouth every 8 (eight) hours as needed for nausea.  20 tablet  0   Review of Systems  Constitutional: Negative for unexpected weight change, or unusual diaphoresis  HENT: Negative for tinnitus.   Eyes: Negative for photophobia and visual disturbance.  Respiratory: Negative for choking and stridor.   Genitourinary: Negative for hematuria and decreased urine volume.  Musculoskeletal: Negative for acute joint swelling Skin: Negative for color change and wound.  Neurological: Negative for tremors and numbness other than noted   Psychiatric/Behavioral: Negative for decreased concentration or  hyperactivity.       Objective:   Physical Exam BP 122/72  Pulse 83  Temp 98.2 F (36.8 C) (Oral)  Ht 6\' 1"  (1.854 m)  Wt 195 lb 4 oz (88.565 kg)  BMI 25.76 kg/m2  SpO2 94% VS noted,  Constitutional: Pt appears well-developed and well-nourished.  HENT: Head: NCAT.  Right Ear: External ear normal.  Left Ear: External ear normal.  Eyes: Conjunctivae and EOM are normal. Pupils are equal, round, and reactive to light.  Neck: Normal range of motion. Neck supple.  Cardiovascular: Normal rate and regular rhythm.   Pulmonary/Chest: Effort normal and breath sounds normal.  Abd:  Soft, NT, non-distended, + BS except mild epigastric tender to mod palpation, no guarding or rebound, but also with probable palpable bladder low mid abdomen/suprapubic Neurological: Pt is alert. Not confused  Skin: Skin is warm. No erythema.  Psychiatric: Pt behavior is normal. Thought content normal.     Assessment & Plan:

## 2012-09-24 NOTE — Patient Instructions (Addendum)
Take over the counter Miralax mixing 17 grams in 8 oz of water 1-2 x daily for constipation.

## 2012-09-24 NOTE — Assessment & Plan Note (Addendum)
Chronic, today with tender to epigastrium/ruq and mid low abd; has been taking alleve recently as well - to d/c this, start miralax daily, colace 100 bid prn, minimize hydrocodone, f/u Dr Russella Dar later today for any other recommendations; also noted mild palpable tender bladder low mid abd most likely - to re-start the finasteride (has been flomax intolerant in the past)

## 2012-09-24 NOTE — Patient Instructions (Addendum)
Please stop the [employer] daily Also consider taking Colace 100 mg twice per day as needed for stool softening as well Please re-start the finasteride 5 mg per day Please continue all other medications as before, and try to minimize taking the pain medication as you can Please keep your appointments with your specialists as you have planned - Dr Russella Dar later today Thank you for enrolling in MyChart. Please follow the instructions below to securely access your online medical record. MyChart allows you to send messages to your doctor, view your test results, renew your prescriptions, schedule appointments, and more. Please return in 3 months, or sooner if needed, with Lab testing done 3-5 days before

## 2012-09-24 NOTE — Progress Notes (Signed)
History of Present Illness: This is an 79 year old male accompanied by his wife. He was recently hospitalized for constipation, right upper quadrant abdominal pain and nausea. Hemoccult positive stool was noted. He has had a recent colonoscopy and upper endoscopy. Portal gastropathy was noted at the upper endoscopy. His constipation is now relieved his right upper quadrant abdominal pain and nausea have abated Has chronic right lower quadrant pain persists. He also had right shoulder and right hip stiffness and these symptoms have also abated.   Current Medications, Allergies, Past Medical History, Past Surgical History, Family History and Social History were reviewed in [employer] record.  Physical Exam: General: Well developed , well nourished, no acute distress Head: Normocephalic and atraumatic Eyes:  sclerae anicteric, EOMI Ears: Normal auditory acuity Mouth: No deformity or lesions Lungs: Clear throughout to auscultation Heart: Regular rate and rhythm; no murmurs, rubs or bruits Abdomen: Soft, mild right lower quadrant tenderness around his incision-chronic finding, and non distended. No masses, hepatosplenomegaly or hernias noted. Normal Bowel sounds Musculoskeletal: Symmetrical with no gross deformities  Pulses:  Normal pulses noted Extremities: No clubbing, cyanosis, edema or deformities noted Neurological: Alert oriented x 4, grossly nonfocal Psychological:  Alert and cooperative. Normal mood and affect  Assessment and Recommendations:  1. Constipation which may have led to right upper quadrant pain and nausea. Use MiraLax once or twice daily as needed for recurrent symptoms.  2. Cirrhosis. Continue alcohol abstinence. Continue to use no more than 1.5 g of Tylenol daily. Interval monitoring for hepatoma with abdominal ultrasound and alpha-fetoprotein.  3. Hemoccult positive stool without anemia. Likely secondary to portal gastropathy. Given recent  colonoscopy and upper endoscopy no need for a repeat colonoscopy or upper endoscopy at this time.  4 Chronic right lower quadrant pain likely related to adhesions or irritable bowel syndrome.

## 2012-09-30 ENCOUNTER — Encounter: Payer: Self-pay | Admitting: Internal Medicine

## 2012-09-30 NOTE — Assessment & Plan Note (Signed)
With palpable bladder today, asked pt to re-start the finasteride, has been alpha blocker intolerant in the past

## 2012-09-30 NOTE — Assessment & Plan Note (Signed)
stable overall by history and exam, recent data reviewed with pt, and pt to continue medical treatment as before,  to f/u any worsening symptoms or concerns Lab Results  Component Value Date   HGBA1C 7.4* 08/13/2012

## 2012-10-02 ENCOUNTER — Ambulatory Visit (INDEPENDENT_AMBULATORY_CARE_PROVIDER_SITE_OTHER): Payer: Medicare Other | Admitting: Internal Medicine

## 2012-10-02 ENCOUNTER — Encounter: Payer: Self-pay | Admitting: Internal Medicine

## 2012-10-02 VITALS — BP 118/64 | HR 60 | Temp 97.7°F | Ht 73.0 in | Wt 196.0 lb

## 2012-10-02 DIAGNOSIS — R1031 Right lower quadrant pain: Secondary | ICD-10-CM

## 2012-10-02 DIAGNOSIS — I10 Essential (primary) hypertension: Secondary | ICD-10-CM

## 2012-10-02 DIAGNOSIS — F411 Generalized anxiety disorder: Secondary | ICD-10-CM

## 2012-10-02 NOTE — Assessment & Plan Note (Signed)
Pt with subjuctive pain and swelling, I am unable to appreciate a definite mass but is tender, radiates to right back on palpation; no n/v, fever, other bowel or bladder change - ? Indirect hernia, will defer to pt  - for referral Dr Orland Dec

## 2012-10-02 NOTE — Assessment & Plan Note (Signed)
Seems increased as of late but he minimizes,  to f/u any worsening symptoms or concerns

## 2012-10-02 NOTE — Progress Notes (Signed)
Subjective:    Patient ID: Alex Higgins, male    DOB: 03-Apr-1930, 79 y.o.   MRN: [id number]  HPI  Here to f/u with 3-4 days worsening RLQ and inguinal area pain with an intermittent lump he states he feels, more to sit, less to stand, without n/v, other abd pain, bowel change and Denies urinary symptoms such as dysuria, frequency, urgency, flank pain, hematuria or n/v, fever, chills.  Pt denies chest pain, increased sob or doe, wheezing, orthopnea, PND, increased LE swelling, palpitations, dizziness or syncope.  Pt denies new neurological symptoms such as new headache, or facial or extremity weakness or numbness. Denies worsening depressive symptoms, suicidal ideation, or panic; has ongoing anxiety, not increased recently.  Past Medical History  Diagnosis Date  . CAD (coronary artery disease)   . Hypertension   . Hyperlipidemia   . Renal insufficiency   . Peripheral edema   . GERD (gastroesophageal reflux disease)   . Diabetes mellitus, type 2   . Nonspecific elevation of levels of transaminase or lactic acid dehydrogenase (LDH)   . Headache   . Incisional hernia   . Oral aphthae   . Anxiety and depression   . BPH (benign prostatic hyperplasia)   . IBS (irritable bowel syndrome)   . Iron deficiency anemia   . Tubular adenoma of colon 10/1993  . Thrombocytopenia   . Hearing loss   . Diverticulosis   . Hemorrhoids   . Colon cancer 1985    arising from a rectal polyp  . Peripheral neuropathy 08/11/2011  . RLS (restless legs syndrome) 08/11/2011  . Postural dizziness   . Cirrhosis   . Portal venous hypertension   . CKD (chronic kidney disease) 08/15/2012  . Hepatic cirrhosis 08/15/2012  . BPH (benign prostatic hypertrophy) 09/24/2012   Past Surgical History  Procedure Date  . Ptca     stents  . Appendectomy   . Tonsillectomy   . Hernia repair   . Cholecystectomy 01/2008  . Hand surgery     bilateral  . Intraocular lens insertion   . Neck surgery     gland removed  .  Esophagogastroduodenoscopy 07/02/2012    Procedure: ESOPHAGOGASTRODUODENOSCOPY (EGD);  Surgeon: Meryl Dare, MD,FACG;  Location: [facility];  Service: Endoscopy;  Laterality: N/A;    reports that he quit smoking about 54 years ago. His smoking use included Cigarettes. He has never used smokeless tobacco. He reports that he drinks alcohol. He reports that he does not use illicit drugs. family history includes Diabetes in an unspecified family member; Heart disease in his mother; and Hypertension in an unspecified family member. Allergies  Allergen Reactions  . Dutasteride Other (See Comments)    unknown  . Flomax (Tamsulosin Hcl)   . Novocain (Procaine) Other (See Comments)    Unknown reaction   Current Outpatient Prescriptions on File Prior to Visit  Medication Sig Dispense Refill  . finasteride (PROSCAR) 5 MG tablet Take 1 tablet (5 mg total) by mouth daily.  90 tablet  3  . hydrocodone-ibuprofen (VICOPROFEN) 5-200 MG per tablet Take 1 tablet by mouth every 4 (four) hours as needed. Pain      . omeprazole (PRILOSEC) 20 MG capsule Take 20 mg by mouth daily as needed. For acid reflux      . ondansetron (ZOFRAN-ODT) 4 MG disintegrating tablet Take 1 tablet (4 mg total) by mouth every 8 (eight) hours as needed for nausea.  20 tablet  0   Review of Systems  Constitutional:  Negative for unexpected weight change, or unusual diaphoresis  HENT: Negative for tinnitus.   Eyes: Negative for photophobia and visual disturbance.  Respiratory: Negative for choking and stridor.   Gastrointestinal: Negative for vomiting and blood in stool.  Genitourinary: Negative for hematuria and decreased urine volume.  Musculoskeletal: Negative for acute joint swelling Skin: Negative for color change and wound.  Neurological: Negative for tremors and numbness other than noted  Psychiatric/Behavioral: Negative for decreased concentration or  hyperactivity.       Objective:   Physical Exam BP 118/64  Pulse  60  Temp 97.7 F (36.5 C) (Oral)  Ht 6\' 1"  (1.854 m)  Wt 196 lb (88.905 kg)  BMI 25.86 kg/m2  SpO2 96% VS noted,  Constitutional: Pt appears well-developed and well-nourished.  HENT: Head: NCAT.  Right Ear: External ear normal.  Left Ear: External ear normal.  Eyes: Conjunctivae and EOM are normal. Pupils are equal, round, and reactive to light.  Neck: Normal range of motion. Neck supple.  Cardiovascular: Normal rate and regular rhythm.   Pulmonary/Chest: Effort normal and breath sounds normal.  Abd:  Soft, non-distended, + BS Neurological: Pt is alert. Not confused  Skin: Skin is warm. No erythema.  Psychiatric: Pt behavior is normal. Thought content normal.  Right inguinal/rlq with a tenderness but unable to appreciate mass or swelling    Assessment & Plan:

## 2012-10-02 NOTE — Assessment & Plan Note (Signed)
stable overall by history and exam, recent data reviewed with pt, and pt to continue medical treatment as before,  to f/u any worsening symptoms or concerns BP Readings from Last 3 Encounters:  10/02/12 118/64  09/24/12 134/76  09/24/12 122/72

## 2012-10-02 NOTE — Patient Instructions (Addendum)
Please continue all other medications as before, and refills have been done if requested. You will be contacted regarding the referral for: General Surgury

## 2012-10-09 ENCOUNTER — Ambulatory Visit (INDEPENDENT_AMBULATORY_CARE_PROVIDER_SITE_OTHER): Payer: Medicare Other | Admitting: Surgery

## 2012-10-09 ENCOUNTER — Encounter (INDEPENDENT_AMBULATORY_CARE_PROVIDER_SITE_OTHER): Payer: Self-pay | Admitting: Surgery

## 2012-10-09 VITALS — BP 106/68 | HR 78 | Temp 96.9°F | Resp 18 | Ht 73.0 in | Wt 195.1 lb

## 2012-10-09 DIAGNOSIS — R1031 Right lower quadrant pain: Secondary | ICD-10-CM

## 2012-10-09 NOTE — Progress Notes (Signed)
General Surgery [facility]  Chief Complaint  Patient presents with  . New Evaluation    evaluate for [facility] - referral from Dr. Oliver Barre    HISTORY: Patient is an 79 year old white male known to my practice through his wife who has been a long time patient for thyroid carcinoma. Patient presents with multiple complaints including right sided abdominal discomfort, right groin pain, right shoulder pain, right back pain, and right leg pain. Patient had recently been evaluated by his primary care physician. He presents today for evaluation for possible right inguinal hernia.  Review of diagnostic studies shows a CT scan from October 2013. Review of these images with the patient shows no sign of ventral incisional hernia, inguinal hernia, or other abnormality in the area of the right groin.  Past Medical History  Diagnosis Date  . CAD (coronary artery disease)   . Hypertension   . Hyperlipidemia   . Renal insufficiency   . Peripheral edema   . GERD (gastroesophageal reflux disease)   . Diabetes mellitus, type 2   . Nonspecific elevation of levels of transaminase or lactic acid dehydrogenase (LDH)   . Headache   . Incisional hernia   . Oral aphthae   . Anxiety and depression   . BPH (benign prostatic hyperplasia)   . IBS (irritable bowel syndrome)   . Iron deficiency anemia   . Tubular adenoma of colon 10/1993  . Thrombocytopenia   . Hearing loss   . Diverticulosis   . Hemorrhoids   . Colon cancer 1985    arising from a rectal polyp  . Peripheral neuropathy 08/11/2011  . RLS (restless legs syndrome) 08/11/2011  . Postural dizziness   . Cirrhosis   . Portal venous hypertension   . CKD (chronic kidney disease) 08/15/2012  . Hepatic cirrhosis 08/15/2012  . BPH (benign prostatic hypertrophy) 09/24/2012  . Chest pain   . Abdominal pain   . Rectal bleeding   . Blood in stool   . Diarrhea   . Nausea & vomiting   . Rectal pain   . Syncope      Current  Outpatient Prescriptions  Medication Sig Dispense Refill  . finasteride (PROSCAR) 5 MG tablet Take 1 tablet (5 mg total) by mouth daily.  90 tablet  3  . hydrocodone-ibuprofen (VICOPROFEN) 5-200 MG per tablet Take 1 tablet by mouth every 4 (four) hours as needed. Pain      . omeprazole (PRILOSEC) 20 MG capsule Take 20 mg by mouth daily as needed. For acid reflux      . ondansetron (ZOFRAN-ODT) 4 MG disintegrating tablet Take 1 tablet (4 mg total) by mouth every 8 (eight) hours as needed for nausea.  20 tablet  0     Allergies  Allergen Reactions  . Dutasteride Other (See Comments)    unknown  . Flomax (Tamsulosin Hcl)   . Novocain (Procaine) Other (See Comments)    Unknown reaction     Family History  Problem Relation Age of Onset  . Heart disease Mother     Brothers x 2  . Diabetes    . Hypertension       History   Social History  . Marital Status: Married    Spouse Name: N/A    Number of Children: 1  . Years of Education: N/A   Occupational History  . retired    Social History Main Topics  . Smoking status: Former Smoker    Types: Cigarettes  Quit date: 09/05/1958  . Smokeless tobacco: Never Used  . Alcohol Use: 0.0 oz/week     Comment: 1 beer once a month, reports drank heavily about 40 years ago  . Drug Use: No  . Sexually Active: Not Currently   Other Topics Concern  . None   Social History Narrative  . None     REVIEW OF SYSTEMS - PERTINENT POSITIVES ONLY: Patient indicates right mid abdominal pain radiating to the back. He notes right shoulder pain radiating into the right arm. He notes low back pain radiating into the right leg.  EXAM: Filed Vitals:   10/09/12 0935  BP: 106/68  Pulse: 78  Temp: 96.9 F (36.1 C)  Resp: 18    HEENT: normocephalic; pupils equal and reactive; sclerae clear; dentition good; mucous membranes moist NECK:  symmetric on extension; no palpable anterior or posterior cervical lymphadenopathy; no supraclavicular  masses; no tenderness CHEST: clear to auscultation bilaterally without rales, rhonchi, or wheezes CARDIAC: regular rate and rhythm without significant murmur; peripheral pulses are full ABDOMEN: soft without distension; bowel sounds present; no mass; no hepatosplenomegaly; no hernia; well-healed paramedian incision right lower quadrant without evidence of incisional hernia GU:  Normal male genitalia; palpation in the right and left inguinal canals with cough and Valsalva shows no sign of inguinal hernia EXT:  non-tender without edema; no deformity NEURO: no gross focal deficits; no sign of tremor   LABORATORY RESULTS: See [facility] (CHL-Epic) for most recent results   RADIOLOGY RESULTS: See [facility] (CHL-Epic) for most recent results   IMPRESSION: #1 no evidence on physical examination or CT scan of inguinal hernia #2 back pain, shoulder pain, and right leg pain, potentially related to degenerative disc disease #3 CT scan evidence of hepatic cirrhosis with portal hypertension  PLAN: I explained to the patient and his wife that there is no evidence of incisional hernia or inguinal hernia on today's examination. Together we examined his radiographic studies.  I have asked the patient to return to his primary care physician and consider evaluation for her degenerative disc disease. It appears in the electronic medical record that a MRI scan of the cervical spine was ordered in November but never completed. Patient will follow-up with his primary care physician regarding this matter.  Patient will return to our surgical practice for follow-up as needed.  [name], MD, FACS General & Endocrine Surgery [facility]   Visit Diagnoses: 1. Right inguinal pain     Primary Care Physician: Oliver Barre, MD

## 2012-10-12 ENCOUNTER — Ambulatory Visit (INDEPENDENT_AMBULATORY_CARE_PROVIDER_SITE_OTHER): Payer: Medicare Other | Admitting: Internal Medicine

## 2012-10-12 ENCOUNTER — Encounter: Payer: Self-pay | Admitting: Internal Medicine

## 2012-10-12 VITALS — BP 112/70 | HR 67 | Temp 97.1°F | Ht 73.0 in | Wt 196.2 lb

## 2012-10-12 DIAGNOSIS — M545 Low back pain: Secondary | ICD-10-CM

## 2012-10-12 DIAGNOSIS — E119 Type 2 diabetes mellitus without complications: Secondary | ICD-10-CM

## 2012-10-12 DIAGNOSIS — I10 Essential (primary) hypertension: Secondary | ICD-10-CM

## 2012-10-12 MED ORDER — LIDOCAINE 5 % EX PTCH: 1.0000 | MEDICATED_PATCH | CUTANEOUS | Status: DC

## 2012-10-12 MED ORDER — CYCLOBENZAPRINE HCL 5 MG PO TABS: 5.0000 mg | ORAL_TABLET | Freq: Three times a day (TID) | ORAL | Status: DC | PRN

## 2012-10-12 NOTE — Assessment & Plan Note (Signed)
stable overall by history and exam, recent data reviewed with pt, and pt to continue medical treatment as before,  to f/u any worsening symptoms or concerns / BP Readings from Last 3 Encounters:  10/12/12 112/70  10/09/12 106/68  10/02/12 118/64

## 2012-10-12 NOTE — Progress Notes (Signed)
Subjective:    Patient ID: Alex Higgins, male    DOB: 06-Jun-1930, 79 y.o.   MRN: [id number]  HPI  Pt continues to have recurring right LBP, but no bowel or bladder change, fever, wt loss,  worsening LE pain/numbness/weakness, gait change or falls.  Recent MRI denied per insurance.  Denies worsening depressive symptoms, suicidal ideation, or panic; has ongoing anxiety.  Pt denies chest pain, increased sob or doe, wheezing, orthopnea, PND, increased LE swelling, palpitations, dizziness or syncope.  Has had intemittent right shoulder pain as well but has FROM.  No recent trauma or falls.  Pt denies new neurological symptoms such as new headache, or facial or extremity weakness or numbness Past Medical History  Diagnosis Date  . CAD (coronary artery disease)   . Hypertension   . Hyperlipidemia   . Renal insufficiency   . Peripheral edema   . GERD (gastroesophageal reflux disease)   . Diabetes mellitus, type 2   . Nonspecific elevation of levels of transaminase or lactic acid dehydrogenase (LDH)   . Headache   . Incisional hernia   . Oral aphthae   . Anxiety and depression   . BPH (benign prostatic hyperplasia)   . IBS (irritable bowel syndrome)   . Iron deficiency anemia   . Tubular adenoma of colon 10/1993  . Thrombocytopenia   . Hearing loss   . Diverticulosis   . Hemorrhoids   . Colon cancer 1985    arising from a rectal polyp  . Peripheral neuropathy 08/11/2011  . RLS (restless legs syndrome) 08/11/2011  . Postural dizziness   . Cirrhosis   . Portal venous hypertension   . CKD (chronic kidney disease) 08/15/2012  . Hepatic cirrhosis 08/15/2012  . BPH (benign prostatic hypertrophy) 09/24/2012  . Chest pain   . Abdominal pain   . Rectal bleeding   . Blood in stool   . Diarrhea   . Nausea & vomiting   . Rectal pain   . Syncope    Past Surgical History  Procedure Date  . Ptca     stents  . Appendectomy   . Tonsillectomy   . Hernia repair   . Cholecystectomy 01/2008   . Hand surgery     bilateral  . Intraocular lens insertion   . Neck surgery     gland removed  . Esophagogastroduodenoscopy 07/02/2012    Procedure: ESOPHAGOGASTRODUODENOSCOPY (EGD);  Surgeon: Meryl Dare, MD,FACG;  Location: [facility];  Service: Endoscopy;  Laterality: N/A;    reports that he quit smoking about 54 years ago. His smoking use included Cigarettes. He has never used smokeless tobacco. He reports that he drinks alcohol. He reports that he does not use illicit drugs. family history includes Diabetes in an unspecified family member; Heart disease in his mother; and Hypertension in an unspecified family member. Allergies  Allergen Reactions  . Dutasteride Other (See Comments)    unknown  . Flomax (Tamsulosin Hcl)   . Novocain (Procaine) Other (See Comments)    Unknown reaction   Current Outpatient Prescriptions on File Prior to Visit  Medication Sig Dispense Refill  . finasteride (PROSCAR) 5 MG tablet Take 1 tablet (5 mg total) by mouth daily.  90 tablet  3  . hydrocodone-ibuprofen (VICOPROFEN) 5-200 MG per tablet Take 1 tablet by mouth every 4 (four) hours as needed. Pain      . omeprazole (PRILOSEC) 20 MG capsule Take 20 mg by mouth daily as needed. For acid reflux      .  ondansetron (ZOFRAN-ODT) 4 MG disintegrating tablet Take 1 tablet (4 mg total) by mouth every 8 (eight) hours as needed for nausea.  20 tablet  0   Review of Systems  Constitutional: Negative for unexpected weight change, or unusual diaphoresis  HENT: Negative for tinnitus.   Eyes: Negative for photophobia and visual disturbance.  Respiratory: Negative for choking and stridor.   Gastrointestinal: Negative for vomiting and blood in stool.  Genitourinary: Negative for hematuria and decreased urine volume.  Musculoskeletal: Negative for acute joint swelling Skin: Negative for color change and wound.  Neurological: Negative for tremors and numbness other than noted  Psychiatric/Behavioral:  Negative for decreased concentration or  hyperactivity.       Objective:   Physical Exam BP 112/70  Pulse 67  Temp 97.1 F (36.2 C) (Oral)  Ht 6\' 1"  (1.854 m)  Wt 196 lb 4 oz (89.018 kg)  BMI 25.89 kg/m2  SpO2 94% VS noted, not ill appaering Constitutional: Pt appears well-developed and well-nourished.  HENT: Head: NCAT.  Right Ear: External ear normal.  Left Ear: External ear normal.  Eyes: Conjunctivae and EOM are normal. Pupils are equal, round, and reactive to light.  Neck: Normal range of motion. Neck supple.  Cardiovascular: Normal rate and regular rhythm.   Pulmonary/Chest: Effort normal and breath sounds normal.  Abd:  Soft, NT, non-distended, + BS Neurological: Pt is alert. Not confused , motor/dtr/gait intact Right lumbar paravertebral tender noted with spasm Skin: Skin is warm. No erythema. No rash Psychiatric: Pt behavior is normal. Thought content normal.1-2+ nervous  Right shoulder NT, FROM    Assessment & Plan:

## 2012-10-12 NOTE — Assessment & Plan Note (Signed)
stable overall by history and exam, recent data reviewed with pt, and pt to continue medical treatment as before,  to f/u any worsening symptoms or concerns Lab Results  Component Value Date   HGBA1C 7.4* 08/13/2012    

## 2012-10-12 NOTE — Patient Instructions (Addendum)
Please take all new medication as prescribed - the muscle relaxer, and pain patch Please continue all other medications as before Please have the pharmacy call with any other refills you may need.

## 2012-10-12 NOTE — Assessment & Plan Note (Signed)
With element of MSK spasm - for flexeril tid prn, but also lidoderm patch prn,  to f/u any worsening symptoms or concerns

## 2012-10-20 ENCOUNTER — Other Ambulatory Visit: Payer: Self-pay

## 2012-10-30 ENCOUNTER — Encounter: Payer: Self-pay | Admitting: Internal Medicine

## 2012-10-30 ENCOUNTER — Ambulatory Visit (INDEPENDENT_AMBULATORY_CARE_PROVIDER_SITE_OTHER): Payer: Medicare Other | Admitting: Internal Medicine

## 2012-10-30 VITALS — BP 108/68 | HR 77 | Temp 97.4°F | Ht 73.0 in | Wt 196.5 lb

## 2012-10-30 DIAGNOSIS — H9190 Unspecified hearing loss, unspecified ear: Secondary | ICD-10-CM

## 2012-10-30 DIAGNOSIS — F411 Generalized anxiety disorder: Secondary | ICD-10-CM

## 2012-10-30 DIAGNOSIS — M545 Low back pain: Secondary | ICD-10-CM

## 2012-10-30 DIAGNOSIS — H9193 Unspecified hearing loss, bilateral: Secondary | ICD-10-CM | POA: Insufficient documentation

## 2012-10-30 DIAGNOSIS — I10 Essential (primary) hypertension: Secondary | ICD-10-CM

## 2012-10-30 MED ORDER — GABAPENTIN 100 MG PO CAPS: 100.0000 mg | ORAL_CAPSULE | Freq: Three times a day (TID) | ORAL | Status: DC

## 2012-10-30 MED ORDER — PREDNISONE 10 MG PO TABS: ORAL_TABLET | ORAL | Status: DC

## 2012-10-30 MED ORDER — DIAZEPAM 2 MG PO TABS: ORAL_TABLET | ORAL | Status: DC

## 2012-10-30 NOTE — Progress Notes (Signed)
Subjective:    Patient ID: Alex Higgins, male    DOB: 11-Aug-1930, 79 y.o.   MRN: [id number]  HPI  Here with persistent sharp adn dull right lower back pain, ongoing for 3 mo now, having been eval twice before recently, MRI denied per [facility], pain now mod, intermittent but more frequent, worse to stand or twist at the waist, but no pain at all with lying down, standing or walking.  No bowel or bladder change, fever, wt loss,  worsening LE pain/numbness/weakness, gait change, though has several falls without injury. Denies urinary symptoms such as dysuria, frequency, urgency, flank pain, hematuria or n/v, fever, chills.  Also with recent bilat hearing loss in the past wk - ? Wax.  Flexeril too sedating, pain patch helped initially now no longer helping.  Pt denies chest pain, increased sob or doe, wheezing, orthopnea, PND, increased LE swelling, palpitations, dizziness or syncope. Pt denies new neurological symptoms such as new headache, or facial or extremity weakness or numbness Denies worsening depressive symptoms, suicidal ideation, or panic; has ongoing anxiety Past Medical History  Diagnosis Date  . CAD (coronary artery disease)   . Hypertension   . Hyperlipidemia   . Renal insufficiency   . Peripheral edema   . GERD (gastroesophageal reflux disease)   . Diabetes mellitus, type 2   . Nonspecific elevation of levels of transaminase or lactic acid dehydrogenase (LDH)   . Headache   . Incisional hernia   . Oral aphthae   . Anxiety and depression   . BPH (benign prostatic hyperplasia)   . IBS (irritable bowel syndrome)   . Iron deficiency anemia   . Tubular adenoma of colon 10/1993  . Thrombocytopenia   . Hearing loss   . Diverticulosis   . Hemorrhoids   . Colon cancer 1985    arising from a rectal polyp  . Peripheral neuropathy 08/11/2011  . RLS (restless legs syndrome) 08/11/2011  . Postural dizziness   . Cirrhosis   . Portal venous hypertension   . CKD (chronic kidney disease)  08/15/2012  . Hepatic cirrhosis 08/15/2012  . BPH (benign prostatic hypertrophy) 09/24/2012  . Chest pain   . Abdominal pain   . Rectal bleeding   . Blood in stool   . Diarrhea   . Nausea & vomiting   . Rectal pain   . Syncope    Past Surgical History  Procedure Laterality Date  . Ptca      stents  . Appendectomy    . Tonsillectomy    . Hernia repair    . Cholecystectomy  01/2008  . Hand surgery      bilateral  . Intraocular lens insertion    . Neck surgery      gland removed  . Esophagogastroduodenoscopy  07/02/2012    Procedure: ESOPHAGOGASTRODUODENOSCOPY (EGD);  Surgeon: Meryl Dare, MD,FACG;  Location: [facility];  Service: Endoscopy;  Laterality: N/A;    reports that he quit smoking about 54 years ago. His smoking use included Cigarettes. He smoked 0.00 packs per day. He has never used smokeless tobacco. He reports that  drinks alcohol. He reports that he does not use illicit drugs. family history includes Diabetes in an unspecified family member; Heart disease in his mother; and Hypertension in an unspecified family member. Allergies  Allergen Reactions  . Dutasteride Other (See Comments)    unknown  . Flomax (Tamsulosin Hcl)   . Novocain (Procaine) Other (See Comments)    Unknown reaction   Current  Outpatient Prescriptions on File Prior to Visit  Medication Sig Dispense Refill  . finasteride (PROSCAR) 5 MG tablet Take 1 tablet (5 mg total) by mouth daily.  90 tablet  3  . hydrocodone-ibuprofen (VICOPROFEN) 5-200 MG per tablet Take 1 tablet by mouth every 4 (four) hours as needed. Pain      . lidocaine (LIDODERM) 5 % Place 1 patch onto the skin daily. Remove & Discard patch within 12 hours or as directed by MD  30 patch  0  . omeprazole (PRILOSEC) 20 MG capsule Take 20 mg by mouth daily as needed. For acid reflux      . ondansetron (ZOFRAN-ODT) 4 MG disintegrating tablet Take 1 tablet (4 mg total) by mouth every 8 (eight) hours as needed for nausea.  20 tablet  0    No current facility-administered medications on file prior to visit.   Review of Systems  Constitutional: Negative for unexpected weight change, or unusual diaphoresis  HENT: Negative for tinnitus.   Eyes: Negative for photophobia and visual disturbance.  Respiratory: Negative for choking and stridor.   Gastrointestinal: Negative for vomiting and blood in stool.  Genitourinary: Negative for hematuria and decreased urine volume.  Musculoskeletal: Negative for acute joint swelling Skin: Negative for color change and wound.  Neurological: Negative for tremors and numbness other than noted  Psychiatric/Behavioral: Negative for decreased concentration or  hyperactivity.       Objective:   Physical Exam BP 108/68  Pulse 77  Temp(Src) 97.4 F (36.3 C) (Oral)  Ht 6\' 1"  (1.854 m)  Wt 196 lb 8 oz (89.132 kg)  BMI 25.93 kg/m2  SpO2 95% VS noted, not ill appearing Constitutional: Pt appears well-developed and well-nourished.  HENT: Head: NCAT.  Right Ear: External ear normal.  Left Ear: External ear normal.  Eyes: Conjunctivae and EOM are normal. Pupils are equal, round, and reactive to light.  Neck: Normal range of motion. Neck supple.  Cardiovascular: Normal rate and regular rhythm.   Pulmonary/Chest: Effort normal and breath sounds normal.  Abd:  Soft, NT, non-distended, + BS Spine nontender Has firm tenderness to right lubmar paravertebral Neurological: Pt is alert. Not confused , motor/dtr/gait intact Skin: Skin is warm. No erythema. no rash, No LE edema Psychiatric: Pt behavior is normal. Thought content normal.1+ nervous     Assessment & Plan:

## 2012-10-30 NOTE — Assessment & Plan Note (Signed)
stable overall by history and exam, recent data reviewed with pt, and pt to continue medical treatment as before,  to f/u any worsening symptoms or concerns Lab Results  Component Value Date   WBC 7.3 09/20/2012   HGB 14.5 09/21/2012   HCT 41.1 09/21/2012   PLT 122* 09/20/2012   GLUCOSE 152* 09/20/2012   CHOL 196 08/13/2012   TRIG 337.0* 08/13/2012   HDL 28.10* 08/13/2012   LDLDIRECT 139.9 08/13/2012   LDLCALC 53 08/11/2011   ALT 26 09/20/2012   AST 29 09/20/2012   NA 140 09/20/2012   K 4.4 09/20/2012   CL 104 09/20/2012   CREATININE 1.56* 09/20/2012   BUN 15 09/20/2012   CO2 27 09/20/2012   TSH 0.61 08/13/2012   PSA 0.94 08/11/2011   INR 1.01 09/20/2012   HGBA1C 7.4* 08/13/2012   MICROALBUR 1.0 08/13/2012

## 2012-10-30 NOTE — Assessment & Plan Note (Signed)
Improved with irrigation,  to f/u any worsening symptoms or concerns  

## 2012-10-30 NOTE — Assessment & Plan Note (Signed)
stable overall by history and exam, recent data reviewed with pt, and pt to continue medical treatment as before,  to f/u any worsening symptoms or concerns BP Readings from Last 3 Encounters:  10/30/12 108/68  10/12/12 112/70  10/09/12 106/68

## 2012-10-30 NOTE — Assessment & Plan Note (Signed)
Moderate persistent, at least in part MSK strain/spasm, cant r/o referred pain as well;  For low dose valium bid prn, gabapentin trial and predpack asd, consider ortho referral

## 2012-10-30 NOTE — Patient Instructions (Addendum)
Your ear canals were both irrigated of wax today Please take all new medication as prescribed - the gabapentin and prednisone as prescribed, and the valium as needed only Please continue all other medications as before, except to stop the flexeril Please call in 1-2 wks if not improved, as you should likely see orthopedic

## 2012-12-04 ENCOUNTER — Telehealth: Payer: Self-pay

## 2012-12-04 NOTE — Telephone Encounter (Signed)
Called patient to schedule follow up abdominal ultrasound and AFP tumor marker and had to leave a message on his voicemail.

## 2012-12-06 NOTE — Telephone Encounter (Signed)
Left another message for patient to return my call.

## 2012-12-10 NOTE — Telephone Encounter (Signed)
Left a message on both of patient's voicemials.

## 2012-12-10 NOTE — Telephone Encounter (Signed)
Patient returned my call but states he has been having abd pain which is making him have lower back pain. Patient scheduled an appt for 12/17/12 at 9:45am. Pt wants to wait to have ultrasound scheduled and labs done during appt time.

## 2012-12-17 ENCOUNTER — Other Ambulatory Visit: Payer: Self-pay

## 2012-12-17 ENCOUNTER — Encounter: Payer: Self-pay | Admitting: Gastroenterology

## 2012-12-17 ENCOUNTER — Ambulatory Visit (INDEPENDENT_AMBULATORY_CARE_PROVIDER_SITE_OTHER): Payer: Medicare Other | Admitting: Gastroenterology

## 2012-12-17 ENCOUNTER — Other Ambulatory Visit (INDEPENDENT_AMBULATORY_CARE_PROVIDER_SITE_OTHER): Payer: Medicare Other

## 2012-12-17 VITALS — BP 100/50 | HR 76 | Ht 72.25 in | Wt 191.0 lb

## 2012-12-17 DIAGNOSIS — K7460 Unspecified cirrhosis of liver: Secondary | ICD-10-CM

## 2012-12-17 DIAGNOSIS — E875 Hyperkalemia: Secondary | ICD-10-CM

## 2012-12-17 DIAGNOSIS — R1031 Right lower quadrant pain: Secondary | ICD-10-CM

## 2012-12-17 LAB — BASIC METABOLIC PANEL
CO2: 30 mEq/L (ref 19–32)
Calcium: 9.2 mg/dL (ref 8.4–10.5)
Creatinine, Ser: 1.6 mg/dL — ABNORMAL HIGH (ref 0.4–1.5)
GFR: 44.69 mL/min — ABNORMAL LOW (ref >60.00)
Sodium: 137 mEq/L (ref 135–145)

## 2012-12-17 LAB — CBC WITH DIFFERENTIAL/PLATELET
Basophils Absolute: 0 10*3/uL (ref 0.0–0.1)
Eosinophils Relative: 1.9 % (ref 0.0–5.0)
HCT: 46.1 % (ref 39.0–52.0)
Hemoglobin: 15.8 g/dL (ref 13.0–17.0)
MCHC: 34.2 g/dL (ref 30.0–36.0)
Monocytes Absolute: 0.5 10*3/uL (ref 0.1–1.0)
Neutrophils Relative %: 60.5 % (ref 43.0–77.0)
RBC: 4.84 Mil/uL (ref 4.22–5.81)

## 2012-12-17 LAB — HEPATIC FUNCTION PANEL
Bilirubin, Direct: 0.3 mg/dL (ref 0.0–0.3)
Total Protein: 6.6 g/dL (ref 6.0–8.3)

## 2012-12-17 NOTE — Patient Instructions (Addendum)
Your physician has requested that you go to the basement for the following lab work before leaving today:AFP, CBC, LFT's, and Bmet.  You have been scheduled for a CT scan of the abdomen and pelvis at [facility] ([address]---this is in the same building as Architectural technologist).   You are scheduled on 12/20/12 at 11:00am. You should arrive 15 minutes prior to your appointment time for registration. Please follow the written instructions below on the day of your exam:  WARNING: IF YOU ARE ALLERGIC TO IODINE/X-RAY DYE, PLEASE NOTIFY RADIOLOGY IMMEDIATELY AT [phone]! YOU WILL BE GIVEN A 13 HOUR PREMEDICATION PREP.  1) Do not eat or drink anything after 7:00am (4 hours prior to your test) 2) You have been given 2 bottles of oral contrast to drink. The solution may taste better if refrigerated, but do NOT add ice or any other liquid to this solution. Shake well before drinking.    Drink 1 bottle of contrast @ 9:00am (2 hours prior to your exam)  Drink 1 bottle of contrast @ 10:00am (1 hour prior to your exam)  You may take any medications as prescribed with a small amount of water except for the following: Metformin, Glucophage, Glucovance, Avandamet, Riomet, Fortamet, Actoplus Met, Janumet, Glumetza or Metaglip. The above medications must be held the day of the exam AND 48 hours after the exam.  The purpose of you drinking the oral contrast is to aid in the visualization of your intestinal tract. The contrast solution may cause some diarrhea. Before your exam is started, you will be given a small amount of fluid to drink. Depending on your individual set of symptoms, you may also receive an intravenous injection of x-ray contrast/dye. Plan on being at [facility] for 30 minutes or long, depending on the type of exam you are having performed.  This test typically takes 30-45 minutes to complete.  If you have any questions regarding your exam or if you need to reschedule,  you may call the CT department at [phone] between the hours of 8:00 am and 5:00 pm, Monday-Friday.  ________________________________________________________________________  Thank you for choosing me and [facility].  Venita Lick. Pleas Koch., MD., Clementeen Graham

## 2012-12-17 NOTE — Progress Notes (Signed)
History of Present Illness: This is an 79 year old male accompanied by his wife. He complains of right lower quadrant pain and right back pain. The symptoms have been present for many years. He is presumed to have adhesions as there has been no definitive gastrointestinal cause the symptoms noted. He was recently evaluated by Dr. Ricky Stabs for the same and he is advised to return to his primary physician to further evaluate his back pain.  He has no gastrointestinal complaints.  Current Medications, Allergies, Past Medical History, Past Surgical History, Family History and Social History were reviewed in [employer] record.  Physical Exam: General: Well developed , well nourished, no acute distress Head: Normocephalic and atraumatic Eyes:  sclerae anicteric, EOMI Ears: Normal auditory acuity Mouth: No deformity or lesions Lungs: Clear throughout to auscultation Heart: Regular rate and rhythm; no murmurs, rubs or bruits Abdomen: Soft, mild RLQ tenderness at the superior border of his RLQ scar and non distended. No masses, hepatosplenomegaly or hernias noted. Normal Bowel sounds Musculoskeletal: Symmetrical with no gross deformities  Pulses:  Normal pulses noted Extremities: No clubbing, cyanosis, edema or deformities noted Neurological: Alert oriented x 4, grossly nonfocal Psychological:  Alert and cooperative. Normal mood and affect  Assessment and Recommendations:  1. Constipation  Use MiraLax once or twice daily as needed for recurrent symptoms.   2. Cirrhosis. Continue alcohol abstinence. Continue to use no more than 1.5 g of Tylenol daily. Interval monitoring for hepatoma with abdominal CT, routine blood work and alpha-fetoprotein.   3. Chronic right lower quadrant pain and right back pain likely related to adhesions or a back disorder. These symptoms are chronic and the patient is not satisfied that we have adequately addressed  this problem. Pelvic CT to  reevaluate. Reassured him that we have evaluated his RLQ complaints many times over the years and that all prior evaluations have been negative. Follow up with Dr. Jonny Ruiz to evaluate for a back disorder or other non GI disorders contributing to his symptoms. I am concerned that he may have a cognitive impairment that prevents him from understanding or remembering our discussions about this problem.  4. Personal history of a polyp with cancer in 1895. Last colonoscopy in 2012 was unremarkable. No plans for future surveillance or screening colonoscopies due to age.

## 2012-12-18 LAB — AFP TUMOR MARKER: AFP-Tumor Marker: 3 ng/mL (ref 0.0–8.0)

## 2012-12-20 ENCOUNTER — Other Ambulatory Visit: Payer: Self-pay

## 2012-12-20 ENCOUNTER — Encounter (INDEPENDENT_AMBULATORY_CARE_PROVIDER_SITE_OTHER): Payer: Self-pay | Admitting: Surgery

## 2012-12-20 ENCOUNTER — Ambulatory Visit (INDEPENDENT_AMBULATORY_CARE_PROVIDER_SITE_OTHER): Payer: Medicare Other | Admitting: Surgery

## 2012-12-20 ENCOUNTER — Ambulatory Visit (INDEPENDENT_AMBULATORY_CARE_PROVIDER_SITE_OTHER)
Admission: RE | Admit: 2012-12-20 | Discharge: 2012-12-20 | Disposition: A | Discharge status: Home / Self Care | Payer: Medicare Other | Source: Ambulatory Visit | Attending: Gastroenterology | Admitting: Gastroenterology

## 2012-12-20 VITALS — BP 118/72 | HR 70 | Temp 97.4°F | Resp 16 | Ht 73.0 in | Wt 191.8 lb

## 2012-12-20 DIAGNOSIS — K7460 Unspecified cirrhosis of liver: Secondary | ICD-10-CM

## 2012-12-20 DIAGNOSIS — R1031 Right lower quadrant pain: Secondary | ICD-10-CM

## 2012-12-20 MED ORDER — IOHEXOL 300 MG/ML  SOLN
100.0000 mL | Freq: Once | INTRAMUSCULAR | Status: AC | PRN
  Administered 2012-12-20: 80 mL via INTRAVENOUS

## 2012-12-20 NOTE — Progress Notes (Signed)
General Surgery [facility]  Visit Diagnoses: 1. Right inguinal pain     HISTORY: Patient is an 79 year old white male with persistent complaints of right inguinal pain radiating into the right flank and right lower back. Patient states on occasion the pain radiates down the right leg and into the right testicle. Patient was evaluated for this complaint in February 2014. Since that time he has been evaluated by his primary care physician and his gastroenterologist. Patient underwent CT scanning earlier today which confirms hepatic cirrhosis with signs of portal hypertension. No abnormality of the lower abdominal wall is noted. On personal review of the images with the patient, there is no evidence of inguinal hernia. There is also no evidence of prior hernia repair.  Patient states the pain in the right groin is constant. He states that it radiates to the right back, right lower extremity, and right testicle. It is exacerbated by physical activity. Patient states that [place] He states that a mesh was used in the repair.  PERTINENT REVIEW OF SYSTEMS: Pain in right groin as noted above.  EXAM: HEENT: normocephalic; pupils equal and reactive; sclerae clear; dentition fair; mucous membranes moist NECK:  symmetric on extension; no palpable anterior or posterior cervical lymphadenopathy; no supraclavicular masses; no tenderness CHEST: clear to auscultation bilaterally without rales, rhonchi, or wheezes CARDIAC: regular rate and rhythm without significant murmur; peripheral pulses are full GU:  Normal male genitalia without mass or lesion; palpation in the right inguinal canal with cough and Valsalva and a standing position showed no sign of laxity and no sign of inguinal hernia; palpation in the left inguinal canal with cough and Valsalva shows no sign of hernia EXT:  non-tender without edema; no deformity NEURO: no gross focal deficits; no  sign of tremor   IMPRESSION: #1 right inguinal pain, no clinical evidence of inguinal hernia #2 hepatic cirrhosis with signs of portal hypertension  PLAN: The patient is insistent that something be done for his pain. There is no evidence of inguinal hernia to be repaired. There is no palpable abnormality. There is no significant abnormality in the right lower quadrant on CT scanning.  I discussed at length with the patient and his wife that surgical intervention was not indicated. I explained that he would be at high risk for general anesthesia or any abdominal surgical procedure due to his hepatic cirrhosis and portal hypertension.  I would recommend referral to a pain clinic for possible ilioinguinal nerve block to see if this would lead to pain relief for this patient.  Unfortunately I see little to offer from a general surgical standpoint.  Patient is scheduled to see his primary care physician in 5 days. I will leave it up to him to make a final decision regarding referral to a pain clinic for nerve block and further management.  [name], MD, [facility] Office: [phone]

## 2012-12-24 ENCOUNTER — Other Ambulatory Visit (INDEPENDENT_AMBULATORY_CARE_PROVIDER_SITE_OTHER): Payer: Medicare Other

## 2012-12-24 ENCOUNTER — Encounter: Payer: Self-pay | Admitting: *Deleted

## 2012-12-24 ENCOUNTER — Encounter: Payer: Self-pay | Admitting: Internal Medicine

## 2012-12-24 ENCOUNTER — Ambulatory Visit (INDEPENDENT_AMBULATORY_CARE_PROVIDER_SITE_OTHER): Payer: Medicare Other | Admitting: Internal Medicine

## 2012-12-24 VITALS — BP 104/60 | HR 67 | Temp 97.0°F | Ht 73.0 in | Wt 191.5 lb

## 2012-12-24 DIAGNOSIS — R1031 Right lower quadrant pain: Secondary | ICD-10-CM

## 2012-12-24 DIAGNOSIS — R7309 Other abnormal glucose: Secondary | ICD-10-CM

## 2012-12-24 DIAGNOSIS — E875 Hyperkalemia: Secondary | ICD-10-CM

## 2012-12-24 DIAGNOSIS — Z23 Encounter for immunization: Secondary | ICD-10-CM

## 2012-12-24 DIAGNOSIS — R7302 Impaired glucose tolerance (oral): Secondary | ICD-10-CM

## 2012-12-24 DIAGNOSIS — Z0000 Encounter for general adult medical examination without abnormal findings: Secondary | ICD-10-CM

## 2012-12-24 DIAGNOSIS — E119 Type 2 diabetes mellitus without complications: Secondary | ICD-10-CM

## 2012-12-24 DIAGNOSIS — N189 Chronic kidney disease, unspecified: Secondary | ICD-10-CM

## 2012-12-24 DIAGNOSIS — I10 Essential (primary) hypertension: Secondary | ICD-10-CM

## 2012-12-24 LAB — BASIC METABOLIC PANEL
BUN: 18 mg/dL (ref 6–23)
CO2: 26 mEq/L (ref 19–32)
Calcium: 8.7 mg/dL (ref 8.4–10.5)
Creatinine, Ser: 1.5 mg/dL (ref 0.4–1.5)
Glucose, Bld: 201 mg/dL — ABNORMAL HIGH (ref 70–99)

## 2012-12-24 MED ORDER — FINASTERIDE 5 MG PO TABS: 5.0000 mg | ORAL_TABLET | Freq: Every day | ORAL | Status: AC

## 2012-12-24 MED ORDER — ASPIRIN 81 MG PO TBEC: 81.0000 mg | DELAYED_RELEASE_TABLET | Freq: Every day | ORAL | Status: DC

## 2012-12-24 NOTE — Patient Instructions (Addendum)
You had the last (third) Hep B shot today Your wife is ok to have the B12 shot since she is due later this wk You will be contacted regarding the referral for: pain management for possible ilioinguinal nerve block Please start the Aspirin at 81 mg - 1 per day - Enteric Coated only Please continue all other medications as before, including the prilosec You are given the hardcopy of finasteride Please return in 3 months, or sooner if needed, with Lab testing done 3-5 days before

## 2012-12-24 NOTE — Progress Notes (Signed)
Subjective:    Patient ID: Alex Higgins, male    DOB: [birth year], 79 y.o.   MRN: [id number]  HPI  Here with wife for f/u; Here to f/u; overall doing ok,  Pt denies chest pain, increased sob or doe, wheezing, orthopnea, PND, increased LE swelling, palpitations, dizziness or syncope.  Pt denies polydipsia, polyuria.  Pt denies new neurological symptoms such as new headache, or facial or extremity weakness or numbness.   Pt states overall good compliance with meds..  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  Due for last Hep B shot today. Still refuses aspirin for prophylaxis as he is concerned about risk of PUD. Due for K repeat today per GI.   Pt denies fever, wt loss, night sweats, loss of appetite, or other constitutional symptoms  Still with right inguinal pain, needs pain clinic referral for nerve block/  Due for last Hep B immunization Past Medical History  Diagnosis Date  . CAD (coronary artery disease)   . Hypertension   . Hyperlipidemia   . Renal insufficiency   . Peripheral edema   . GERD (gastroesophageal reflux disease)   . Diabetes mellitus, type 2   . Nonspecific elevation of levels of transaminase or lactic acid dehydrogenase (LDH)   . Headache   . Incisional hernia   . Oral aphthae   . Anxiety and depression   . BPH (benign prostatic hyperplasia)   . IBS (irritable bowel syndrome)   . Iron deficiency anemia   . Tubular adenoma of colon 10/1993  . Thrombocytopenia   . Hearing loss   . Diverticulosis   . Hemorrhoids   . Colon cancer 1985    arising from a rectal polyp  . Peripheral neuropathy 08/11/2011  . RLS (restless legs syndrome) 08/11/2011  . Postural dizziness   . Cirrhosis   . Portal venous hypertension   . CKD (chronic kidney disease) 08/15/2012  . Hepatic cirrhosis 08/15/2012  . BPH (benign prostatic hypertrophy) 09/24/2012  . Chest pain   . Abdominal pain   . Rectal bleeding   . Blood in stool   .  Diarrhea   . Nausea & vomiting   . Rectal pain   . Syncope    Past Surgical History  Procedure Laterality Date  . Ptca      stents  . Appendectomy    . Tonsillectomy    . Hernia repair    . Cholecystectomy  01/2008  . Hand surgery      bilateral  . Intraocular lens insertion    . Neck surgery      gland removed  . Esophagogastroduodenoscopy  07/02/2012    Procedure: ESOPHAGOGASTRODUODENOSCOPY (EGD);  Surgeon: Meryl Dare, MD,FACG;  Location: [facility];  Service: Endoscopy;  Laterality: N/A;    reports that he quit smoking about 54 years ago. His smoking use included Cigarettes. He smoked 0.00 packs per day. He has never used smokeless tobacco. He reports that  drinks alcohol. He reports that he does not use illicit drugs. family history includes Diabetes in an unspecified family member; Heart disease in his mother; and Hypertension in an unspecified family member. Allergies  Allergen Reactions  . Avodart (Dutasteride) Other (See Comments)    unknown  . Flomax (Tamsulosin Hcl)   . Novocain (Procaine) Other (See Comments)    Unknown reaction   Current Outpatient Prescriptions on File Prior to Visit  Medication Sig Dispense Refill  . citalopram (CELEXA)  20 MG tablet Take 20 mg by mouth daily.      Marland Kitchen ezetimibe (ZETIA) 10 MG tablet Take 10 mg by mouth daily.      . metFORMIN (GLUCOPHAGE) 500 MG tablet Take 500 mg by mouth daily with breakfast.      . omeprazole (PRILOSEC) 20 MG capsule Take 20 mg by mouth daily as needed. For acid reflux      . pravastatin (PRAVACHOL) 80 MG tablet Take 80 mg by mouth daily.      . TRADJENTA 5 MG TABS tablet Take 5 mg by mouth daily.        No current facility-administered medications on file prior to visit.   Review of Systems  Constitutional: Negative for unexpected weight change, or unusual diaphoresis  HENT: Negative for tinnitus.   Eyes: Negative for photophobia and visual disturbance.  Respiratory: Negative for choking and stridor.    Gastrointestinal: Negative for vomiting and blood in stool.  Genitourinary: Negative for hematuria and decreased urine volume.  Musculoskeletal: Negative for acute joint swelling Skin: Negative for color change and wound.  Neurological: Negative for tremors and numbness other than noted  Psychiatric/Behavioral: Negative for decreased concentration or  hyperactivity.       Objective:      Physical Exam BP 104/60  Pulse 67  Temp(Src) 97 F (36.1 C) (Oral)  Ht 6\' 1"  (1.854 m)  Wt 191 lb 8 oz (86.864 kg)  BMI 25.27 kg/m2  SpO2 94% VS noted, not ill appaering Constitutional: Pt appears well-developed and well-nourished.  HENT: Head: NCAT.  Right Ear: External ear normal.  Left Ear: External ear normal.  Eyes: Conjunctivae and EOM are normal. Pupils are equal, round, and reactive to light.  Neck: Normal range of motion. Neck supple.  Cardiovascular: Normal rate and regular rhythm.   Pulmonary/Chest: Effort normal and breath sounds normal.  Abd:  Soft, NT, non-distended, + BS Neurological: Pt is alert. Skin: Skin is warm. No erythema.  Psychiatric: Pt behavior is normal. Mild nervous.     Assessment & Plan:

## 2012-12-26 DIAGNOSIS — R7302 Impaired glucose tolerance (oral): Secondary | ICD-10-CM | POA: Insufficient documentation

## 2012-12-26 NOTE — Assessment & Plan Note (Signed)
Ok for pain clinic referral, ? Benefit from nerve block

## 2012-12-26 NOTE — Assessment & Plan Note (Signed)
Volume stable, stable overall by history and exam, recent data reviewed with pt, and pt to continue medical treatment as before,  to f/u any worsening symptoms or concerns Lab Results  Component Value Date   CREATININE 1.5 12/24/2012

## 2012-12-26 NOTE — Assessment & Plan Note (Signed)
stable overall by history and exam, recent data reviewed with pt, and pt to continue medical treatment as before,  to f/u any worsening symptoms or concerns Lab Results  Component Value Date   HGBA1C 7.4* 08/13/2012

## 2012-12-26 NOTE — Assessment & Plan Note (Signed)
stable overall by history and exam, recent data reviewed with pt, and pt to continue medical treatment as before,  to f/u any worsening symptoms or concerns BP Readings from Last 3 Encounters:  12/24/12 104/60  12/20/12 118/72  12/17/12 100/50

## 2013-01-01 ENCOUNTER — Encounter: Payer: Self-pay | Admitting: Physical Medicine & Rehabilitation

## 2013-01-23 ENCOUNTER — Encounter: Payer: Self-pay | Admitting: *Deleted

## 2013-01-23 ENCOUNTER — Emergency Department (INDEPENDENT_AMBULATORY_CARE_PROVIDER_SITE_OTHER)
Admission: EM | Admit: 2013-01-23 | Discharge: 2013-01-23 | Disposition: A | Discharge status: Home / Self Care | Payer: Medicare Other | Source: Home / Self Care | Attending: Emergency Medicine | Admitting: Emergency Medicine

## 2013-01-23 DIAGNOSIS — S91312A Laceration without foreign body, left foot, initial encounter: Secondary | ICD-10-CM

## 2013-01-23 DIAGNOSIS — S91309A Unspecified open wound, unspecified foot, initial encounter: Secondary | ICD-10-CM

## 2013-01-23 DIAGNOSIS — Z23 Encounter for immunization: Secondary | ICD-10-CM

## 2013-01-23 MED ORDER — TETANUS-DIPHTH-ACELL PERTUSSIS 5-2.5-18.5 LF-MCG/0.5 IM SUSP
0.5000 mL | Freq: Once | INTRAMUSCULAR | Status: AC
  Administered 2013-01-23: 0.5 mL via INTRAMUSCULAR

## 2013-01-23 MED ORDER — AMOXICILLIN-POT CLAVULANATE 875-125 MG PO TABS: 1.0000 | ORAL_TABLET | Freq: Two times a day (BID) | ORAL | Status: DC

## 2013-01-23 NOTE — ED Provider Notes (Signed)
History     CSN: [id number]  Arrival date & time 01/23/13  5784   First MD Initiated Contact with Patient 01/23/13 [phone]      Chief Complaint  Patient presents with  . Wound Check    Patient is a 79 y.o. male presenting with skin laceration. The history is provided by the patient and the spouse.  Laceration Location:  Foot Foot laceration location:  L heel Length (cm):  2 Depth:  Cutaneous Quality: straight   Time since incident:  7 days Injury mechanism: While cutting grass with lawnmower. Pain details:    Quality: Minimal achy pain. No foreign body sensation. Foreign body present:  No foreign bodies Relieved by:  Certain positions Tetanus status:  Out of date  Alex Higgins received a laceration from his lawn mower on his left heel 1 week ago. Site is healing well with sanguinous grainage. He  is concerned about infection.--he has not sought medical care for this until now.  Past Medical History  Diagnosis Date  . CAD (coronary artery disease)   . Hypertension   . Hyperlipidemia   . Renal insufficiency   . Peripheral edema   . GERD (gastroesophageal reflux disease)   . Diabetes mellitus, type 2   . Nonspecific elevation of levels of transaminase or lactic acid dehydrogenase (LDH)   . Headache   . Incisional hernia   . Oral aphthae   . Anxiety and depression   . BPH (benign prostatic hyperplasia)   . IBS (irritable bowel syndrome)   . Iron deficiency anemia   . Tubular adenoma of colon 10/1993  . Thrombocytopenia   . Hearing loss   . Diverticulosis   . Hemorrhoids   . Colon cancer 1985    arising from a rectal polyp  . Peripheral neuropathy 08/11/2011  . RLS (restless legs syndrome) 08/11/2011  . Postural dizziness   . Cirrhosis   . Portal venous hypertension   . CKD (chronic kidney disease) 08/15/2012  . Hepatic cirrhosis 08/15/2012  . BPH (benign prostatic hypertrophy) 09/24/2012  . Chest pain   . Abdominal pain   . Rectal bleeding   . Blood in stool   .  Diarrhea   . Nausea & vomiting   . Rectal pain   . Syncope     Past Surgical History  Procedure Laterality Date  . Ptca      stents  . Appendectomy    . Tonsillectomy    . Hernia repair    . Cholecystectomy  01/2008  . Hand surgery      bilateral  . Intraocular lens insertion    . Neck surgery      gland removed  . Esophagogastroduodenoscopy  07/02/2012    Procedure: ESOPHAGOGASTRODUODENOSCOPY (EGD);  Surgeon: Meryl Dare, MD,FACG;  Location: [facility];  Service: Endoscopy;  Laterality: N/A;    Family History  Problem Relation Age of Onset  . Heart disease Mother     Brothers x 2  . Diabetes    . Hypertension      History  Substance Use Topics  . Smoking status: Former Smoker    Types: Cigarettes    Quit date: 09/05/1958  . Smokeless tobacco: Never Used  . Alcohol Use: 0.0 oz/week     Comment: 1 beer once a month, reports drank heavily about 40 years ago      Review of Systems  Constitutional: Negative for fever and chills.  Musculoskeletal: Positive for back pain (Chronic low back pain for months,  seeing his PCP and pain clinic for this).  All other systems reviewed and are negative.    Allergies  Avodart; Flomax; and Novocain  Home Medications   Current Outpatient Rx  Name  Route  Sig  Dispense  Refill  . amoxicillin-clavulanate (AUGMENTIN) 875-125 MG per tablet   Oral   Take 1 tablet by mouth every 12 (twelve) hours. Take for 10 days. Take with food.   20 tablet   0   . aspirin 81 MG EC tablet   Oral   Take 1 tablet (81 mg total) by mouth daily. Swallow whole.   30 tablet   12   . citalopram (CELEXA) 20 MG tablet   Oral   Take 20 mg by mouth daily.         Marland Kitchen ezetimibe (ZETIA) 10 MG tablet   Oral   Take 10 mg by mouth daily.         . finasteride (PROSCAR) 5 MG tablet   Oral   Take 1 tablet (5 mg total) by mouth daily.   90 tablet   3   . metFORMIN (GLUCOPHAGE) 500 MG tablet   Oral   Take 500 mg by mouth daily with  breakfast.         . omeprazole (PRILOSEC) 20 MG capsule   Oral   Take 20 mg by mouth daily as needed. For acid reflux         . pravastatin (PRAVACHOL) 80 MG tablet   Oral   Take 80 mg by mouth daily.         . TRADJENTA 5 MG TABS tablet   Oral   Take 5 mg by mouth daily.            BP 94/53  Pulse 62  Temp(Src) 97.7 F (36.5 C) (Oral)  Resp 16  Ht 6\' 1"  (1.854 m)  Wt 193 lb (87.544 kg)  BMI 25.47 kg/m2  SpO2 96%  Physical Exam  Nursing note and vitals reviewed. Constitutional: He is oriented to person, place, and time. He appears well-developed and well-nourished. No distress.  HENT:  Head: Normocephalic and atraumatic.  Eyes: Conjunctivae and EOM are normal. Pupils are equal, round, and reactive to light. No scleral icterus.  Neck: Normal range of motion.  Cardiovascular: Normal rate.   Pulmonary/Chest: Effort normal.  Abdominal: He exhibits no distension.  Musculoskeletal: Normal range of motion.  Neurological: He is alert and oriented to person, place, and time.  Skin: Skin is warm.  On the left heel, is 2 cm linear opened wound that is granulating in. Mild redness and tenderness, but no fluctuance. Minimal serosanguineous drainage. By inspection and palpation and some probing, no signs of foreign body. No red streaks. Neurovascular intact  Psychiatric: He has a normal mood and affect.    ED Course  Procedures (including critical care time)  Labs Reviewed - No data to display No results found.   1. Laceration of foot except toe alone, left, initial encounter       MDM  This deep left heel laceration/puncture wound is one week old.  It's granulating in, but evidence of mild infection. Dtap given. Augmentin prescribed to cover aerobes and anaerobes. Wound cleansed with hibiclens and then polysporin dressing. Daily wound care discussed with patient and wife. Red flags discussed. Recheck here with PCP is no better one week, sooner if worse renew  symptoms.        Lajean Manes, MD 01/23/13 2234

## 2013-01-23 NOTE — ED Notes (Addendum)
Alex Higgins received a laceration from his lawn mower on his left heel 1 week ago. Site is healing well with sanguinous grainage. He is worried he may have a piece of material inside and is concerned about infection. Laceration is about 3/4" long and negative for redness or swelling. Last tetanus vaccine date unknown.

## 2013-01-24 ENCOUNTER — Encounter: Payer: Self-pay | Admitting: Internal Medicine

## 2013-01-24 ENCOUNTER — Ambulatory Visit (INDEPENDENT_AMBULATORY_CARE_PROVIDER_SITE_OTHER): Payer: Medicare Other | Admitting: Internal Medicine

## 2013-01-24 VITALS — BP 110/62 | HR 64 | Temp 97.0°F | Ht 73.0 in | Wt 194.2 lb

## 2013-01-24 DIAGNOSIS — J069 Acute upper respiratory infection, unspecified: Secondary | ICD-10-CM | POA: Insufficient documentation

## 2013-01-24 DIAGNOSIS — I10 Essential (primary) hypertension: Secondary | ICD-10-CM

## 2013-01-24 DIAGNOSIS — E119 Type 2 diabetes mellitus without complications: Secondary | ICD-10-CM

## 2013-01-24 NOTE — Progress Notes (Signed)
Subjective:    Patient ID: Alex Higgins, male    DOB: [birth year], 79 y.o.   MRN: [id number]  HPI   Here with 2-3 days acute onset fever, facial pain, pressure, headache, general weakness and malaise, and greenish d/c, with mild ST and cough, but pt denies chest pain, wheezing, increased sob or doe, orthopnea, PND, increased LE swelling, palpitations, dizziness or syncope.   Pt denies polydipsia, polyuria. Pt denies new neurological symptoms such as new headache, or facial or extremity weakness or numbness. Also incidentally with heel injury with mowing yesterday, had stiches and took first dose augmentin last night, tolerated ok. Denies worsening depressive symptoms, suicidal ideation, or panic; has ongoing anxiety Past Medical History  Diagnosis Date  . CAD (coronary artery disease)   . Hypertension   . Hyperlipidemia   . Renal insufficiency   . Peripheral edema   . GERD (gastroesophageal reflux disease)   . Diabetes mellitus, type 2   . Nonspecific elevation of levels of transaminase or lactic acid dehydrogenase (LDH)   . Headache   . Incisional hernia   . Oral aphthae   . Anxiety and depression   . BPH (benign prostatic hyperplasia)   . IBS (irritable bowel syndrome)   . Iron deficiency anemia   . Tubular adenoma of colon 10/1993  . Thrombocytopenia   . Hearing loss   . Diverticulosis   . Hemorrhoids   . Colon cancer 1985    arising from a rectal polyp  . Peripheral neuropathy 08/11/2011  . RLS (restless legs syndrome) 08/11/2011  . Postural dizziness   . Cirrhosis   . Portal venous hypertension   . CKD (chronic kidney disease) 08/15/2012  . Hepatic cirrhosis 08/15/2012  . BPH (benign prostatic hypertrophy) 09/24/2012  . Chest pain   . Abdominal pain   . Rectal bleeding   . Blood in stool   . Diarrhea   . Nausea & vomiting   . Rectal pain   . Syncope    Past Surgical History  Procedure Laterality Date  . Ptca      stents  . Appendectomy    . Tonsillectomy    .  Hernia repair    . Cholecystectomy  01/2008  . Hand surgery      bilateral  . Intraocular lens insertion    . Neck surgery      gland removed  . Esophagogastroduodenoscopy  07/02/2012    Procedure: ESOPHAGOGASTRODUODENOSCOPY (EGD);  Surgeon: Meryl Dare, MD,FACG;  Location: [facility];  Service: Endoscopy;  Laterality: N/A;    reports that he quit smoking about 54 years ago. His smoking use included Cigarettes. He smoked 0.00 packs per day. He has never used smokeless tobacco. He reports that  drinks alcohol. He reports that he does not use illicit drugs. family history includes Diabetes in an unspecified family member; Heart disease in his mother; and Hypertension in an unspecified family member. Allergies  Allergen Reactions  . Avodart (Dutasteride) Other (See Comments)    unknown  . Flomax (Tamsulosin Hcl)   . Novocain (Procaine) Other (See Comments)    Unknown reaction   Current Outpatient Prescriptions on File Prior to Visit  Medication Sig Dispense Refill  . amoxicillin-clavulanate (AUGMENTIN) 875-125 MG per tablet Take 1 tablet by mouth every 12 (twelve) hours. Take for 10 days. Take with food.  20 tablet  0  . aspirin 81 MG EC tablet Take 1 tablet (81 mg total) by mouth daily. Swallow whole.  30 tablet  12  . citalopram (CELEXA) 20 MG tablet Take 20 mg by mouth daily.      Marland Kitchen ezetimibe (ZETIA) 10 MG tablet Take 10 mg by mouth daily.      . finasteride (PROSCAR) 5 MG tablet Take 1 tablet (5 mg total) by mouth daily.  90 tablet  3  . metFORMIN (GLUCOPHAGE) 500 MG tablet Take 500 mg by mouth daily with breakfast.      . omeprazole (PRILOSEC) 20 MG capsule Take 20 mg by mouth daily as needed. For acid reflux      . pravastatin (PRAVACHOL) 80 MG tablet Take 80 mg by mouth daily.      . TRADJENTA 5 MG TABS tablet Take 5 mg by mouth daily.        No current facility-administered medications on file prior to visit.   Review of Systems  Constitutional: Negative for unexpected  weight change, or unusual diaphoresis  HENT: Negative for tinnitus.   Eyes: Negative for photophobia and visual disturbance.  Respiratory: Negative for choking and stridor.   Gastrointestinal: Negative for vomiting and blood in stool.  Genitourinary: Negative for hematuria and decreased urine volume.  Musculoskeletal: Negative for acute joint swelling Skin: Negative for color change and wound.  Neurological: Negative for tremors and numbness other than noted  Psychiatric/Behavioral: Negative for decreased concentration or  hyperactivity.       Objective:   Physical Exam BP 110/62  Pulse 64  Temp(Src) 97 F (36.1 C) (Oral)  Ht 6\' 1"  (1.854 m)  Wt 194 lb 4 oz (88.111 kg)  BMI 25.63 kg/m2  SpO2 95% VS noted, mild ill Constitutional: Pt appears well-developed and well-nourished.  HENT: Head: NCAT.  Right Ear: External ear normal.  Left Ear: External ear normal.  Eyes: Conjunctivae and EOM are normal. Pupils are equal, round, and reactive to light.  Bilat tm's with mild erythema.  Max sinus areas mild tender.  Pharynx with mild erythema, no exudate Neck: Normal range of motion. Neck supple.  Cardiovascular: Normal rate and regular rhythm.   Pulmonary/Chest: Effort normal and breath sounds normal.  Neurological: Pt is alert. Not confused  Skin: Skin is warm. No erythema.  Psychiatric: Pt behavior is normal. Thought content normal.     Assessment & Plan:

## 2013-01-24 NOTE — Assessment & Plan Note (Signed)
stable overall by history and exam, recent data reviewed with pt, and pt to continue medical treatment as before,  to f/u any worsening symptoms or concerns Lab Results  Component Value Date   HGBA1C 7.4* 08/13/2012    

## 2013-01-24 NOTE — Assessment & Plan Note (Signed)
Mild to mod, to cont the augmentin,  to f/u any worsening symptoms or concerns, for mucinex otc prn

## 2013-01-24 NOTE — Assessment & Plan Note (Signed)
stable overall by history and exam, recent data reviewed with pt, and pt to continue medical treatment as before,  to f/u any worsening symptoms or concerns BP Readings from Last 3 Encounters:  01/24/13 110/62  01/23/13 94/53  12/24/12 104/60

## 2013-01-24 NOTE — Patient Instructions (Signed)
Please continue all other medications as before, includiing the antibiotic you were given yesterday. Please have the pharmacy call with any other refills you may need. Please continue your efforts at being more active, low cholesterol diet, and weight control.

## 2013-01-25 ENCOUNTER — Ambulatory Visit: Payer: Medicare Other | Admitting: Physical Medicine & Rehabilitation

## 2013-02-08 ENCOUNTER — Ambulatory Visit: Payer: Medicare Other | Admitting: Physical Medicine & Rehabilitation

## 2013-02-28 ENCOUNTER — Encounter: Payer: Self-pay | Admitting: Internal Medicine

## 2013-02-28 ENCOUNTER — Other Ambulatory Visit (INDEPENDENT_AMBULATORY_CARE_PROVIDER_SITE_OTHER): Payer: Medicare Other

## 2013-02-28 ENCOUNTER — Ambulatory Visit (INDEPENDENT_AMBULATORY_CARE_PROVIDER_SITE_OTHER): Payer: Medicare Other | Admitting: Internal Medicine

## 2013-02-28 VITALS — BP 102/70 | HR 64 | Temp 97.3°F | Ht 73.0 in | Wt 192.5 lb

## 2013-02-28 DIAGNOSIS — Z0000 Encounter for general adult medical examination without abnormal findings: Secondary | ICD-10-CM

## 2013-02-28 DIAGNOSIS — E119 Type 2 diabetes mellitus without complications: Secondary | ICD-10-CM

## 2013-02-28 DIAGNOSIS — J069 Acute upper respiratory infection, unspecified: Secondary | ICD-10-CM

## 2013-02-28 LAB — URINALYSIS, ROUTINE W REFLEX MICROSCOPIC
Bilirubin Urine: NEGATIVE
Hgb urine dipstick: NEGATIVE
Ketones, ur: NEGATIVE
Leukocytes, UA: NEGATIVE
Nitrite: NEGATIVE
Urobilinogen, UA: 1 (ref 0.0–1.0)
pH: 6.5 (ref 5.0–8.0)

## 2013-02-28 LAB — LIPID PANEL: Cholesterol: 183 mg/dL (ref 0–200)

## 2013-02-28 LAB — CBC WITH DIFFERENTIAL/PLATELET
Basophils Absolute: 0 10*3/uL (ref 0.0–0.1)
Eosinophils Relative: 5.9 % — ABNORMAL HIGH (ref 0.0–5.0)
Lymphs Abs: 2 10*3/uL (ref 0.7–4.0)
MCV: 97.3 fl (ref 78.0–100.0)
Monocytes Absolute: 0.6 10*3/uL (ref 0.1–1.0)
Neutrophils Relative %: 57.2 % (ref 43.0–77.0)
Platelets: 139 10*3/uL — ABNORMAL LOW (ref 150.0–400.0)
RDW: 13.6 % (ref 11.5–14.6)
WBC: 7.3 10*3/uL (ref 4.5–10.5)

## 2013-02-28 LAB — HEPATIC FUNCTION PANEL
ALT: 18 U/L (ref 0–53)
AST: 23 U/L (ref 0–37)
Albumin: 3.8 g/dL (ref 3.5–5.2)
Alkaline Phosphatase: 67 U/L (ref 39–117)
Total Bilirubin: 2.1 mg/dL — ABNORMAL HIGH (ref 0.3–1.2)

## 2013-02-28 LAB — BASIC METABOLIC PANEL
BUN: 15 mg/dL (ref 6–23)
Chloride: 102 mEq/L (ref 96–112)
Glucose, Bld: 94 mg/dL (ref 70–99)
Potassium: 4.5 mEq/L (ref 3.5–5.1)
Sodium: 137 mEq/L (ref 135–145)

## 2013-02-28 LAB — MICROALBUMIN / CREATININE URINE RATIO: Microalb Creat Ratio: 0.3 mg/g (ref 0.0–30.0)

## 2013-02-28 LAB — HEMOGLOBIN A1C: Hgb A1c MFr Bld: 6 % (ref 4.6–6.5)

## 2013-02-28 LAB — TSH: TSH: 0.64 u[IU]/mL (ref 0.35–5.50)

## 2013-02-28 NOTE — Assessment & Plan Note (Signed)

## 2013-02-28 NOTE — Patient Instructions (Signed)

## 2013-02-28 NOTE — Progress Notes (Signed)
Subjective:    Patient ID: Alex Higgins, male    DOB: 1930-02-09, 79 y.o.   MRN: [id number]  HPI  Here for wellness and f/u;  Overall doing ok;  Pt denies CP, worsening SOB, DOE, wheezing, orthopnea, PND, worsening LE edema, palpitations, dizziness or syncope.  Pt denies neurological change such as new headache, facial or extremity weakness.  Pt denies polydipsia, polyuria, or low sugar symptoms. Pt states overall good compliance with treatment and medications, good tolerability, and has been trying to follow lower cholesterol diet.  Pt denies worsening depressive symptoms, suicidal ideation or panic. No fever, night sweats, wt loss, loss of appetite, or other constitutional symptoms.  Pt states good ability with ADL's, has low fall risk, home safety reviewed and adequate, no other significant changes in hearing or vision, and only occasionally active with exercise. No acute complaints, feels "like a million bucks" after recent antibx. Past Medical History  Diagnosis Date  . CAD (coronary artery disease)   . Hypertension   . Hyperlipidemia   . Renal insufficiency   . Peripheral edema   . GERD (gastroesophageal reflux disease)   . Diabetes mellitus, type 2   . Nonspecific elevation of levels of transaminase or lactic acid dehydrogenase (LDH)   . Headache(784.0)   . Incisional hernia   . Oral aphthae   . Anxiety and depression   . BPH (benign prostatic hyperplasia)   . IBS (irritable bowel syndrome)   . Iron deficiency anemia   . Tubular adenoma of colon 10/1993  . Thrombocytopenia   . Hearing loss   . Diverticulosis   . Hemorrhoids   . Colon cancer 1985    arising from a rectal polyp  . Peripheral neuropathy 08/11/2011  . RLS (restless legs syndrome) 08/11/2011  . Postural dizziness   . Cirrhosis   . Portal venous hypertension   . CKD (chronic kidney disease) 08/15/2012  . Hepatic cirrhosis 08/15/2012  . BPH (benign prostatic hypertrophy) 09/24/2012  . Chest pain   . Abdominal  pain   . Rectal bleeding   . Blood in stool   . Diarrhea   . Nausea & vomiting   . Rectal pain   . Syncope    Past Surgical History  Procedure Laterality Date  . Ptca      stents  . Appendectomy    . Tonsillectomy    . Hernia repair    . Cholecystectomy  01/2008  . Hand surgery      bilateral  . Intraocular lens insertion    . Neck surgery      gland removed  . Esophagogastroduodenoscopy  07/02/2012    Procedure: ESOPHAGOGASTRODUODENOSCOPY (EGD);  Surgeon: Meryl Dare, MD,FACG;  Location: [facility];  Service: Endoscopy;  Laterality: N/A;    reports that he quit smoking about 54 years ago. His smoking use included Cigarettes. He smoked 0.00 packs per day. He has never used smokeless tobacco. He reports that  drinks alcohol. He reports that he does not use illicit drugs. family history includes Diabetes in an unspecified family member; Heart disease in his mother; and Hypertension in an unspecified family member. Allergies  Allergen Reactions  . Avodart (Dutasteride) Other (See Comments)    unknown  . Flomax (Tamsulosin Hcl)   . Novocain (Procaine) Other (See Comments)    Unknown reaction   Current Outpatient Prescriptions on File Prior to Visit  Medication Sig Dispense Refill  . aspirin 81 MG EC tablet Take 1 tablet (81 mg total) by  mouth daily. Swallow whole.  30 tablet  12  . citalopram (CELEXA) 20 MG tablet Take 20 mg by mouth daily.      Marland Kitchen ezetimibe (ZETIA) 10 MG tablet Take 10 mg by mouth daily.      . finasteride (PROSCAR) 5 MG tablet Take 1 tablet (5 mg total) by mouth daily.  90 tablet  3  . metFORMIN (GLUCOPHAGE) 500 MG tablet Take 500 mg by mouth daily with breakfast.      . omeprazole (PRILOSEC) 20 MG capsule Take 20 mg by mouth daily as needed. For acid reflux      . pravastatin (PRAVACHOL) 80 MG tablet Take 80 mg by mouth daily.      . TRADJENTA 5 MG TABS tablet Take 5 mg by mouth daily.        No current facility-administered medications on file prior  to visit.    Review of Systems Constitutional: Negative for diaphoresis, activity change, appetite change or unexpected weight change.  HENT: Negative for hearing loss, ear pain, facial swelling, mouth sores and neck stiffness.   Eyes: Negative for pain, redness and visual disturbance.  Respiratory: Negative for shortness of breath and wheezing.   Cardiovascular: Negative for chest pain and palpitations.  Gastrointestinal: Negative for diarrhea, blood in stool, abdominal distention or other pain Genitourinary: Negative for hematuria, flank pain or change in urine volume.  Musculoskeletal: Negative for myalgias and joint swelling.  Skin: Negative for color change and wound.  Neurological: Negative for syncope and numbness. other than noted Hematological: Negative for adenopathy.  Psychiatric/Behavioral: Negative for hallucinations, self-injury, decreased concentration and agitation.      Objective:   Physical Exam BP 102/70  Pulse 64  Temp(Src) 97.3 F (36.3 C) (Oral)  Ht 6\' 1"  (1.854 m)  Wt 192 lb 8 oz (87.317 kg)  BMI 25.4 kg/m2  SpO2 94% VS noted,  Constitutional: Pt is oriented to person, place, and time. Appears well-developed and well-nourished.  Head: Normocephalic and atraumatic.  Right Ear: External ear normal.  Left Ear: External ear normal.  Nose: Nose normal.  Mouth/Throat: Oropharynx is clear and moist.  Eyes: Conjunctivae and EOM are normal. Pupils are equal, round, and reactive to light.  Neck: Normal range of motion. Neck supple. No JVD present. No tracheal deviation present.  Cardiovascular: Normal rate, regular rhythm, normal heart sounds and intact distal pulses.   Pulmonary/Chest: Effort normal and breath sounds normal.  Abdominal: Soft. Bowel sounds are normal. There is no tenderness. No HSM  Musculoskeletal: Normal range of motion. Exhibits no edema.  Lymphadenopathy:  Has no cervical adenopathy.  Neurological: Pt is alert and oriented to person, place,  and time. Pt has normal reflexes. No cranial nerve deficit.  Skin: Skin is warm and dry. No rash noted.  Psychiatric:  Has  normal mood and affect. Behavior is normal.     Assessment & Plan:

## 2013-02-28 NOTE — Assessment & Plan Note (Signed)
Resolved,  to f/u any worsening symptoms or concerns  

## 2013-02-28 NOTE — Assessment & Plan Note (Signed)
stable overall by history and exam, recent data reviewed with pt, and pt to continue medical treatment as before,  to f/u any worsening symptoms or concerns Lab Results  Component Value Date   HGBA1C 6.0 02/28/2013

## 2013-03-28 ENCOUNTER — Ambulatory Visit: Payer: Medicare Other | Admitting: Internal Medicine

## 2013-04-10 ENCOUNTER — Other Ambulatory Visit: Payer: Self-pay

## 2013-05-01 ENCOUNTER — Telehealth: Payer: Self-pay | Admitting: *Deleted

## 2013-05-01 MED ORDER — EZETIMIBE 10 MG PO TABS: 10.0000 mg | ORAL_TABLET | Freq: Every day | ORAL | Status: DC

## 2013-05-01 NOTE — Telephone Encounter (Signed)
Pt called requesting Zetia refill.  Please advise

## 2013-05-01 NOTE — Telephone Encounter (Signed)
Ok - to robin 

## 2013-05-01 NOTE — Telephone Encounter (Signed)
Refill done.  

## 2013-05-10 ENCOUNTER — Encounter: Payer: Self-pay | Admitting: Gastroenterology

## 2013-05-22 ENCOUNTER — Emergency Department (INDEPENDENT_AMBULATORY_CARE_PROVIDER_SITE_OTHER)
Admission: EM | Admit: 2013-05-22 | Discharge: 2013-05-22 | Disposition: A | Discharge status: Home / Self Care | Payer: Medicare Other | Source: Home / Self Care | Attending: Family Medicine | Admitting: Family Medicine

## 2013-05-22 ENCOUNTER — Encounter: Payer: Self-pay | Admitting: *Deleted

## 2013-05-22 DIAGNOSIS — H6123 Impacted cerumen, bilateral: Secondary | ICD-10-CM

## 2013-05-22 DIAGNOSIS — H6120 Impacted cerumen, unspecified ear: Secondary | ICD-10-CM

## 2013-05-22 NOTE — ED Notes (Signed)
Pt c/o bilateral cerumen impaction with decreased hearing in the RT ear.

## 2013-05-22 NOTE — ED Provider Notes (Signed)
CSN: [id number]     Arrival date & time 05/22/13  1124 History   First MD Initiated Contact with Patient 05/22/13 1233     Chief Complaint  Patient presents with  . Cerumen Impaction      HPI Comments:   Pt complains of bilateral cerumen impaction with decreased hearing in the RT ear.  No earache, sinus congestion, or fever.  Patient is a 79 y.o. male presenting with plugged ear sensation. The history is provided by the patient.  Ear Fullness This is a recurrent problem. The current episode started more than 1 week ago. The problem occurs constantly. The problem has not changed since onset.Associated symptoms comments: No earache. Nothing aggravates the symptoms. Nothing relieves the symptoms. He has tried nothing for the symptoms.    Past Medical History  Diagnosis Date  . CAD (coronary artery disease)   . Hypertension   . Hyperlipidemia   . Renal insufficiency   . Peripheral edema   . GERD (gastroesophageal reflux disease)   . Diabetes mellitus, type 2   . Nonspecific elevation of levels of transaminase or lactic acid dehydrogenase (LDH)   . Headache(784.0)   . Incisional hernia   . Oral aphthae   . Anxiety and depression   . BPH (benign prostatic hyperplasia)   . IBS (irritable bowel syndrome)   . Iron deficiency anemia   . Tubular adenoma of colon 10/1993  . Thrombocytopenia   . Hearing loss   . Diverticulosis   . Hemorrhoids   . Colon cancer 1985    arising from a rectal polyp  . Peripheral neuropathy 08/11/2011  . RLS (restless legs syndrome) 08/11/2011  . Postural dizziness   . Cirrhosis   . Portal venous hypertension   . CKD (chronic kidney disease) 08/15/2012  . Hepatic cirrhosis 08/15/2012  . BPH (benign prostatic hypertrophy) 09/24/2012  . Chest pain   . Abdominal pain   . Rectal bleeding   . Blood in stool   . Diarrhea   . Nausea & vomiting   . Rectal pain   . Syncope    Past Surgical History  Procedure Laterality Date  . Ptca      stents  .  Appendectomy    . Tonsillectomy    . Hernia repair    . Cholecystectomy  01/2008  . Hand surgery      bilateral  . Intraocular lens insertion    . Neck surgery      gland removed  . Esophagogastroduodenoscopy  07/02/2012    Procedure: ESOPHAGOGASTRODUODENOSCOPY (EGD);  Surgeon: Meryl Dare, MD,FACG;  Location: [facility];  Service: Endoscopy;  Laterality: N/A;   Family History  Problem Relation Age of Onset  . Heart disease Mother     Brothers x 2  . Diabetes    . Hypertension     History  Substance Use Topics  . Smoking status: Former Smoker    Types: Cigarettes    Quit date: 09/05/1958  . Smokeless tobacco: Never Used  . Alcohol Use: 0.0 oz/week     Comment: 1 beer once a month, reports drank heavily about 40 years ago    Review of Systems  All other systems reviewed and are negative.    Allergies  Avodart; Flomax; and Novocain  Home Medications   Current Outpatient Rx  Name  Route  Sig  Dispense  Refill  . aspirin 81 MG EC tablet   Oral   Take 1 tablet (81 mg total) by mouth daily.  Swallow whole.   30 tablet   12   . citalopram (CELEXA) 20 MG tablet   Oral   Take 20 mg by mouth daily.         Marland Kitchen ezetimibe (ZETIA) 10 MG tablet   Oral   Take 1 tablet (10 mg total) by mouth daily.   90 tablet   3   . finasteride (PROSCAR) 5 MG tablet   Oral   Take 1 tablet (5 mg total) by mouth daily.   90 tablet   3   . metFORMIN (GLUCOPHAGE) 500 MG tablet   Oral   Take 500 mg by mouth daily with breakfast.         . omeprazole (PRILOSEC) 20 MG capsule   Oral   Take 20 mg by mouth daily as needed. For acid reflux         . pravastatin (PRAVACHOL) 80 MG tablet   Oral   Take 80 mg by mouth daily.         . TRADJENTA 5 MG TABS tablet   Oral   Take 5 mg by mouth daily.           BP 104/58  Pulse 61  Temp(Src) 97.7 F (36.5 C) (Oral)  Resp 18  SpO2 97% Physical Exam Nursing notes and Vital Signs reviewed. Appearance:  Patient appears  healthy, stated age, and in no acute distress Eyes:  Pupils are equal, round, and reactive to light and accomodation.  Extraocular movement is intact.  Conjunctivae are not inflamed  Ears:  Canals occluded with cerumen.  Post lavage, canals and tympanic membranes normal.  Skin:  No rash present.   ED Course  Procedures  Ear lavage by nurse     MDM   1. Impacted cerumen, bilateral      Call if ear pain or discharge occurs    Lattie Haw, MD 05/23/13 1112

## 2013-05-30 ENCOUNTER — Other Ambulatory Visit: Payer: Self-pay | Admitting: Internal Medicine

## 2013-06-02 ENCOUNTER — Emergency Department (INDEPENDENT_AMBULATORY_CARE_PROVIDER_SITE_OTHER)
Admission: EM | Admit: 2013-06-02 | Discharge: 2013-06-02 | Disposition: A | Discharge status: Home / Self Care | Payer: Medicare Other | Source: Home / Self Care | Attending: Family Medicine | Admitting: Family Medicine

## 2013-06-02 DIAGNOSIS — J029 Acute pharyngitis, unspecified: Secondary | ICD-10-CM

## 2013-06-02 DIAGNOSIS — J069 Acute upper respiratory infection, unspecified: Secondary | ICD-10-CM

## 2013-06-02 LAB — POCT CBC W AUTO DIFF (K'VILLE URGENT CARE)

## 2013-06-02 LAB — POCT RAPID STREP A (OFFICE): Rapid Strep A Screen: NEGATIVE

## 2013-06-02 MED ORDER — GUAIFENESIN-CODEINE 100-10 MG/5ML PO SYRP: 10.0000 mL | ORAL_SOLUTION | Freq: Every day | ORAL | Status: DC

## 2013-06-02 MED ORDER — AMOXICILLIN 875 MG PO TABS: 875.0000 mg | ORAL_TABLET | Freq: Two times a day (BID) | ORAL | Status: DC

## 2013-06-02 NOTE — ED Provider Notes (Signed)
CSN: [id number]     Arrival date & time 06/02/13  1348 History   First MD Initiated Contact with Patient 06/02/13 1413     Chief Complaint  Patient presents with  . Sore Throat    x 4 days  . Nasal Congestion    x 4 days  . Generalized Body Aches    x 4 days  . Fever    x 4 days      HPI Comments: Patient complains of approximately 6 day history of URI symptoms that started with fatigue, nasal congestion, sore throat, headache, earache, chills/sweats, and myalgias.  He has now had a productive cough for three days.  He has also had occasional nausea and wheezing.  The history is provided by the patient.    Past Medical History  Diagnosis Date  . CAD (coronary artery disease)   . Hypertension   . Hyperlipidemia   . Renal insufficiency   . Peripheral edema   . GERD (gastroesophageal reflux disease)   . Diabetes mellitus, type 2   . Nonspecific elevation of levels of transaminase or lactic acid dehydrogenase (LDH)   . Headache(784.0)   . Incisional hernia   . Oral aphthae   . Anxiety and depression   . BPH (benign prostatic hyperplasia)   . IBS (irritable bowel syndrome)   . Iron deficiency anemia   . Tubular adenoma of colon 10/1993  . Thrombocytopenia   . Hearing loss   . Diverticulosis   . Hemorrhoids   . Colon cancer 1985    arising from a rectal polyp  . Peripheral neuropathy 08/11/2011  . RLS (restless legs syndrome) 08/11/2011  . Postural dizziness   . Cirrhosis   . Portal venous hypertension   . CKD (chronic kidney disease) 08/15/2012  . Hepatic cirrhosis 08/15/2012  . BPH (benign prostatic hypertrophy) 09/24/2012  . Chest pain   . Abdominal pain   . Rectal bleeding   . Blood in stool   . Diarrhea   . Nausea & vomiting   . Rectal pain   . Syncope    Past Surgical History  Procedure Laterality Date  . Ptca      stents  . Appendectomy    . Tonsillectomy    . Hernia repair    . Cholecystectomy  01/2008  . Hand surgery      bilateral  . Intraocular  lens insertion    . Neck surgery      gland removed  . Esophagogastroduodenoscopy  07/02/2012    Procedure: ESOPHAGOGASTRODUODENOSCOPY (EGD);  Surgeon: Meryl Dare, MD,FACG;  Location: [facility];  Service: Endoscopy;  Laterality: N/A;   Family History  Problem Relation Age of Onset  . Heart disease Mother     Brothers x 2  . Diabetes    . Hypertension     History  Substance Use Topics  . Smoking status: Former Smoker    Types: Cigarettes    Quit date: 09/05/1958  . Smokeless tobacco: Never Used  . Alcohol Use: 0.0 oz/week     Comment: 1 beer once a month, reports drank heavily about 40 years ago    Review of Systems + sore throat + cough No pleuritic pain + wheezing + nasal congestion + post-nasal drainage No sinus pain/pressure No itchy/red eyes ? earache No hemoptysis No SOB + fever, + chills + nausea No vomiting No abdominal pain No diarrhea No urinary symptoms No skin rashes + fatigue + myalgias No headache Used OTC meds without  relief  Allergies  Avodart; Flomax; and Novocain  Home Medications   Current Outpatient Rx  Name  Route  Sig  Dispense  Refill  . aspirin 81 MG EC tablet   Oral   Take 1 tablet (81 mg total) by mouth daily. Swallow whole.   30 tablet   12   . citalopram (CELEXA) 20 MG tablet   Oral   Take 20 mg by mouth daily.         Marland Kitchen ezetimibe (ZETIA) 10 MG tablet   Oral   Take 1 tablet (10 mg total) by mouth daily.   90 tablet   3   . finasteride (PROSCAR) 5 MG tablet   Oral   Take 1 tablet (5 mg total) by mouth daily.   90 tablet   3   . metFORMIN (GLUCOPHAGE) 500 MG tablet   Oral   Take 500 mg by mouth daily with breakfast.         . omeprazole (PRILOSEC) 20 MG capsule   Oral   Take 20 mg by mouth daily as needed. For acid reflux         . pravastatin (PRAVACHOL) 80 MG tablet   Oral   Take 80 mg by mouth daily.         . TRADJENTA 5 MG TABS tablet      take 1 tablet by mouth once daily   90  tablet   3   . amoxicillin (AMOXIL) 875 MG tablet   Oral   Take 1 tablet (875 mg total) by mouth 2 (two) times daily. (Rx void after 06/13/13)   20 tablet   0   . guaiFENesin-codeine (ROBITUSSIN AC) 100-10 MG/5ML syrup   Oral   Take 10 mLs by mouth at bedtime. for cough   120 mL   0    BP 127/70  Pulse 84  Temp(Src) 99.4 F (37.4 C) (Oral)  Ht 6\' 1"  (1.854 m)  Wt 192 lb (87.091 kg)  BMI 25.34 kg/m2  SpO2 95% Physical Exam Nursing notes and Vital Signs reviewed. Appearance:  Patient appears healthy, stated age, and in no acute distress Eyes:  Pupils are equal, round, and reactive to light and accomodation.  Extraocular movement is intact.  Conjunctivae are not inflamed  Ears:  Canals normal.  Tympanic membranes normal.  Nose:  Mildly congested turbinates.  No sinus tenderness.   Pharynx:  Normal Neck:  Supple.  Tender shotty posterior nodes are palpated bilaterally  Lungs:  Clear to auscultation.  Breath sounds are equal.  Heart:  Regular rate and rhythm without murmurs, rubs, or gallops.  Abdomen:  Nontender without masses or hepatosplenomegaly.  Bowel sounds are present.  No CVA or flank tenderness.  Extremities:  No edema.  No calf tenderness Skin:  No rash present.   ED Course  Procedures  none    Labs Reviewed  POCT RAPID STREP A (OFFICE) - Normal       MDM   1. Sore throat   2. Acute upper respiratory infections of unspecified site; suspect early viral URI    There is no evidence of bacterial infection today.   Treat symptomatically for now  Rx for Tussi-Organidin at bedtime. Take plain Mucinex (guaifenesin) twice daily for cough and congestion.  Increase fluid intake, rest. May use Afrin nasal spray (or generic oxymetazoline) twice daily for about 5 days.  Also recommend using saline nasal spray several times daily and saline nasal irrigation (AYR is a common brand)  Stop all antihistamines for now, and other non-prescription cough/cold preparations. Begin  Amoxicillin if not improving about one week or if persistent fever develops (Given a prescription to hold, with an expiration date)  Follow-up with family doctor if not improving10 days.     Lattie Haw, MD 06/03/13 [id number]

## 2013-06-02 NOTE — ED Notes (Signed)
Alex Higgins complains of fever, chills, body aches, runny nose, sore throat, sneezing, congestion, wheezing and cough for 4 days.

## 2013-06-09 ENCOUNTER — Emergency Department (INDEPENDENT_AMBULATORY_CARE_PROVIDER_SITE_OTHER)
Admission: EM | Admit: 2013-06-09 | Discharge: 2013-06-09 | Disposition: A | Discharge status: Home / Self Care | Payer: Medicare Other | Source: Home / Self Care | Attending: Family Medicine | Admitting: Family Medicine

## 2013-06-09 ENCOUNTER — Emergency Department (INDEPENDENT_AMBULATORY_CARE_PROVIDER_SITE_OTHER): Payer: Medicare Other

## 2013-06-09 ENCOUNTER — Encounter: Payer: Self-pay | Admitting: *Deleted

## 2013-06-09 DIAGNOSIS — R05 Cough: Secondary | ICD-10-CM

## 2013-06-09 DIAGNOSIS — J069 Acute upper respiratory infection, unspecified: Secondary | ICD-10-CM

## 2013-06-09 DIAGNOSIS — Z8679 Personal history of other diseases of the circulatory system: Secondary | ICD-10-CM

## 2013-06-09 LAB — POCT CBC W AUTO DIFF (K'VILLE URGENT CARE)

## 2013-06-09 NOTE — ED Provider Notes (Signed)
CSN: [id number]     Arrival date & time 06/09/13  1438 History   None    Chief Complaint  Patient presents with  . Fever  . Nausea      HPI Comments: Patient returns for follow-up of his cough, which has persisted.  He now feels well, and cough has improved considerably but persists.  He has had sweats/chills over the past week, now resolved.  He also reports that he has had a several year history of feeling light-headed when he stands quickly from a sitting position.  The feeling subsides if he remains still and grasps a desk/chair, etc.  He reports that his BP is low when he stands.  No history of anemia.  No chest pain or palpitations.  He states that he has been told there is nothing to be done for his light-headedness.  The history is provided by the patient and the spouse.    Past Medical History  Diagnosis Date  . CAD (coronary artery disease)   . Hypertension   . Hyperlipidemia   . Renal insufficiency   . Peripheral edema   . GERD (gastroesophageal reflux disease)   . Diabetes mellitus, type 2   . Nonspecific elevation of levels of transaminase or lactic acid dehydrogenase (LDH)   . Headache(784.0)   . Incisional hernia   . Oral aphthae   . Anxiety and depression   . BPH (benign prostatic hyperplasia)   . IBS (irritable bowel syndrome)   . Iron deficiency anemia   . Tubular adenoma of colon 10/1993  . Thrombocytopenia   . Hearing loss   . Diverticulosis   . Hemorrhoids   . Colon cancer 1985    arising from a rectal polyp  . Peripheral neuropathy 08/11/2011  . RLS (restless legs syndrome) 08/11/2011  . Postural dizziness   . Cirrhosis   . Portal venous hypertension   . CKD (chronic kidney disease) 08/15/2012  . Hepatic cirrhosis 08/15/2012  . BPH (benign prostatic hypertrophy) 09/24/2012  . Chest pain   . Abdominal pain   . Rectal bleeding   . Blood in stool   . Diarrhea   . Nausea & vomiting   . Rectal pain   . Syncope    Past Surgical History  Procedure  Laterality Date  . Ptca      stents  . Appendectomy    . Tonsillectomy    . Hernia repair    . Cholecystectomy  01/2008  . Hand surgery      bilateral  . Intraocular lens insertion    . Neck surgery      gland removed  . Esophagogastroduodenoscopy  07/02/2012    Procedure: ESOPHAGOGASTRODUODENOSCOPY (EGD);  Surgeon: Meryl Dare, MD,FACG;  Location: [facility];  Service: Endoscopy;  Laterality: N/A;   Family History  Problem Relation Age of Onset  . Heart disease Mother     Brothers x 2  . Diabetes    . Hypertension     History  Substance Use Topics  . Smoking status: Former Smoker    Types: Cigarettes    Quit date: 09/05/1958  . Smokeless tobacco: Never Used  . Alcohol Use: 0.0 oz/week     Comment: 1 beer once a month, reports drank heavily about 40 years ago    Review of Systems No sore throat + light-headedness + cough No pleuritic pain No wheezing + nasal congestion + post-nasal drainage No sinus pain/pressure No itchy/red eyes No earache No hemoptysis No SOB No  chest pain No palpitations No fever, + chills + nausea, rare No vomiting No abdominal pain No diarrhea No urinary symptoms No skin rashes + fatigue No myalgias + headache Used OTC meds without relief  Allergies  Avodart; Flomax; and Novocain  Home Medications   Current Outpatient Rx  Name  Route  Sig  Dispense  Refill  . amoxicillin (AMOXIL) 875 MG tablet   Oral   Take 1 tablet (875 mg total) by mouth 2 (two) times daily. (Rx void after 06/13/13)   20 tablet   0   . aspirin 81 MG EC tablet   Oral   Take 1 tablet (81 mg total) by mouth daily. Swallow whole.   30 tablet   12   . citalopram (CELEXA) 20 MG tablet   Oral   Take 20 mg by mouth daily.         Marland Kitchen ezetimibe (ZETIA) 10 MG tablet   Oral   Take 1 tablet (10 mg total) by mouth daily.   90 tablet   3   . finasteride (PROSCAR) 5 MG tablet   Oral   Take 1 tablet (5 mg total) by mouth daily.   90 tablet   3    . guaiFENesin-codeine (ROBITUSSIN AC) 100-10 MG/5ML syrup   Oral   Take 10 mLs by mouth at bedtime. for cough   120 mL   0   . metFORMIN (GLUCOPHAGE) 500 MG tablet   Oral   Take 500 mg by mouth daily with breakfast.         . omeprazole (PRILOSEC) 20 MG capsule   Oral   Take 20 mg by mouth daily as needed. For acid reflux         . pravastatin (PRAVACHOL) 80 MG tablet   Oral   Take 80 mg by mouth daily.         . TRADJENTA 5 MG TABS tablet      take 1 tablet by mouth once daily   90 tablet   3    BP 122/61  Pulse 68  Temp(Src) 98.2 F (36.8 C) (Oral)  Resp 16  Ht 6\' 1"  (1.854 m)  Wt 192 lb (87.091 kg)  BMI 25.34 kg/m2  SpO2 96% Physical Exam Nursing notes and Vital Signs reviewed. Appearance:  Patient appears healthy, stated age, and in no acute distress Eyes:  Pupils are equal, round, and reactive to light and accomodation.  Extraocular movement is intact.  Conjunctivae are not inflamed  Ears:  Canals normal.  Tympanic membranes normal.  Nose:  Mildly congested turbinates.  No sinus tenderness.    Pharynx:  Normal; moist mucous membranes  Neck:  Supple.  No adenopathy Lungs:  Clear to auscultation.  Breath sounds are equal.  Heart:  Regular rate and rhythm without murmurs, rubs, or gallops.  Abdomen:  Nontender without masses or hepatosplenomegaly.  Bowel sounds are present.  No CVA or flank tenderness.  Extremities:  No edema.  No calf tenderness Skin:  No rash present.   ED Course  Procedures  none Labs Review Labs Reviewed  POCT CBC W AUTO DIFF ([facility])  WBC 5.3; LY 31.2; MO 7.8; GR 61.0; Hgb 14.3; Platelets 125    Imaging Review Dg Chest 2 View  06/09/2013   CLINICAL DATA:  Cough.  EXAM: CHEST - 2 VIEW  COMPARISON:  05/18/2012  FINDINGS: The heart size and mediastinal contours are within normal limits. There is no evidence of pulmonary edema, consolidation, pneumothorax,  nodule or pleural fluid. The visualized skeletal structures are  unremarkable.  IMPRESSION: No active disease.   Electronically Signed   By: Irish Lack M.D.   On: 06/09/2013 16:06    MDM   1. Acute upper respiratory infections of unspecified site; suspect resolving viral URI with post-infectious cough.  Normal white blood count and chest x-ray reassuring.  2. History of orthostatic hypotension    Patient reassured with respect to his resolving URI.  Finish antibiotic. Recommend that he follow-up with his PCP for further evaluation of orthostatic hypotension.    Lattie Haw, MD 06/11/13 [phone]

## 2013-06-09 NOTE — ED Notes (Addendum)
Patient states he is not doing better from when he was here last wk patient states nausea (new symptom) developed 2-3 days ago. Patient states he has not started AX and is requesting a chest x-ray.

## 2013-06-24 ENCOUNTER — Telehealth: Payer: Self-pay

## 2013-06-24 DIAGNOSIS — K7460 Unspecified cirrhosis of liver: Secondary | ICD-10-CM

## 2013-06-24 NOTE — Telephone Encounter (Signed)
Left message for patient to call back  

## 2013-06-24 NOTE — Telephone Encounter (Signed)
Message copied by Annett Fabian on Mon Jun 24, 2013  1:39 PM ------      Message from: Annett Fabian      Created: Thu Dec 20, 2012  4:37 PM       Needs AFP and [place] ------

## 2013-06-28 NOTE — Telephone Encounter (Signed)
Patient is scheduled for [place] at [facility] radiology 07/02/13 8:30 and he is aware to arrive at 8:15 and be NPO after midnight.  He is also advised to come for labs here

## 2013-07-02 ENCOUNTER — Ambulatory Visit (HOSPITAL_COMMUNITY)
Admission: RE | Admit: 2013-07-02 | Discharge: 2013-07-02 | Disposition: A | Discharge status: Home / Self Care | Payer: Medicare Other | Source: Ambulatory Visit | Attending: Gastroenterology | Admitting: Gastroenterology

## 2013-07-02 ENCOUNTER — Other Ambulatory Visit: Payer: Medicare Other

## 2013-07-02 ENCOUNTER — Other Ambulatory Visit (HOSPITAL_COMMUNITY): Payer: Medicare Other

## 2013-07-02 DIAGNOSIS — K7460 Unspecified cirrhosis of liver: Secondary | ICD-10-CM

## 2013-07-02 DIAGNOSIS — N289 Disorder of kidney and ureter, unspecified: Secondary | ICD-10-CM | POA: Insufficient documentation

## 2013-07-02 DIAGNOSIS — R161 Splenomegaly, not elsewhere classified: Secondary | ICD-10-CM | POA: Insufficient documentation

## 2013-07-02 DIAGNOSIS — E119 Type 2 diabetes mellitus without complications: Secondary | ICD-10-CM | POA: Insufficient documentation

## 2013-07-03 ENCOUNTER — Ambulatory Visit (INDEPENDENT_AMBULATORY_CARE_PROVIDER_SITE_OTHER): Payer: Medicare Other | Admitting: Nurse Practitioner

## 2013-07-03 ENCOUNTER — Encounter: Payer: Self-pay | Admitting: Nurse Practitioner

## 2013-07-03 VITALS — BP 100/60 | HR 60 | Ht 73.0 in | Wt 198.8 lb

## 2013-07-03 DIAGNOSIS — R109 Unspecified abdominal pain: Secondary | ICD-10-CM

## 2013-07-03 LAB — AFP TUMOR MARKER: AFP-Tumor Marker: 2.6 ng/mL (ref 0.0–8.0)

## 2013-07-03 NOTE — Progress Notes (Signed)
HPI :  Patient is an 79 year old male known to Dr. Russella Dar for history of cirrhosis. Patient comes in today for evaluation of abdominal pain. Patient has chronic right sided abdominal pain for which no GI etiology has ever been found. Pain felt to be muscular or maybe related to adhesions. Patient had screening ultrasound for [facility] yesterday. Patient feels the tech pushed very hard during the exam. After arriving home patient had significant right sided abdominal pain spanning from RUQ to RLQ. He became nauseaated and had one episode of loose stool.  No further nausea. No further loose stool but still having abdominal pain. Patient cannot understand why no one has ever been able to determine cause of right sided abdominal pain. CTscan in April for this right sided abdominal pain was unrevealing. Ultrasound yesterday revealed cirrhosis and mild splenomegaly.  AFP is normal.   Past Medical History  Diagnosis Date  . CAD (coronary artery disease)   . Hypertension   . Hyperlipidemia   . Renal insufficiency   . Peripheral edema   . GERD (gastroesophageal reflux disease)   . Diabetes mellitus, type 2   . Nonspecific elevation of levels of transaminase or lactic acid dehydrogenase (LDH)   . Headache(784.0)   . Incisional hernia   . Oral aphthae   . Anxiety and depression   . BPH (benign prostatic hyperplasia)   . IBS (irritable bowel syndrome)   . Iron deficiency anemia   . Tubular adenoma of colon 10/1993  . Thrombocytopenia   . Hearing loss   . Diverticulosis   . Hemorrhoids   . Colon cancer 1985    arising from a rectal polyp  . Peripheral neuropathy 08/11/2011  . RLS (restless legs syndrome) 08/11/2011  . Postural dizziness   . Cirrhosis   . Portal venous hypertension   . CKD (chronic kidney disease) 08/15/2012  . Hepatic cirrhosis 08/15/2012  . BPH (benign prostatic hypertrophy) 09/24/2012    Family History  Problem Relation Age of Onset  . Heart disease Mother   . Colon cancer  Neg Hx   . Heart disease Brother     x2   History  Substance Use Topics  . Smoking status: Former Smoker    Types: Cigarettes    Quit date: 09/05/1958  . Smokeless tobacco: Never Used  . Alcohol Use: 0.0 oz/week     Comment: 1 beer once a month, reports drank heavily about 40 years ago   Current Outpatient Prescriptions  Medication Sig Dispense Refill  . citalopram (CELEXA) 20 MG tablet Take 20 mg by mouth daily.      Marland Kitchen ezetimibe (ZETIA) 10 MG tablet Take 1 tablet (10 mg total) by mouth daily.  90 tablet  3  . finasteride (PROSCAR) 5 MG tablet Take 1 tablet (5 mg total) by mouth daily.  90 tablet  3  . omeprazole (PRILOSEC) 20 MG capsule Take 20 mg by mouth daily as needed. For acid reflux      . pravastatin (PRAVACHOL) 80 MG tablet Take 80 mg by mouth daily.      . TRADJENTA 5 MG TABS tablet take 1 tablet by mouth once daily  90 tablet  3   No current facility-administered medications for this visit.   Allergies  Allergen Reactions  . Avodart [Dutasteride] Other (See Comments)    unknown  . Flomax [Tamsulosin Hcl]   . Novocain [Procaine] Other (See Comments)    Unknown reaction    US Abdomen Complete  07/02/2013  CLINICAL DATA:  History of cirrhosis, diabetes, prior cholecystectomy, and prior appendectomy.  EXAM: ULTRASOUND ABDOMEN COMPLETE  COMPARISON:  Ultrasound 07/31/2012.  CT 12/20/2012.  FINDINGS: Gallbladder  There is history of prior cholecystectomy. No gallbladder is evident.  Common bile duct  Diameter: The common bile duct measures 4.8 mm. No choledocholithiasis is evident.  Liver  History is given of hepatic cirrhosis. There is slight scalloping of the serosal surface consistent with is. Parenchyma is somewhat heterogeneous but no discrete mass is identified. Portal vein is patent with hepatopetal flow.  IVC  No abnormality visualized.  Pancreas  Visualized portion unremarkable. Bowel gas obscures portions of pancreatic tail.  Spleen  Spleen appears slightly enlarged  with a length of 13 cm and transverse diameters of 5 x 16 cm with a volume of 577 mL. No focal splenic abnormality is seen.  Right Kidney  Length: Right renal length is 10.9 cm. Echogenicity within normal limits. No mass or hydronephrosis visualized. There is cortical thinning.  Left Kidney  Length: Left renal length is 12.5 cm. Echogenicity within normal limits. No mass or hydronephrosis visualized. There is cortical thinning.  Abdominal aorta  No aneurysm visualized.  Maximum diameter is 2.5 cm.  IMPRESSION: History is given of hepatic cirrhosis. There is slight scalloping of the serosal surface consistent with is. Parenchyma is somewhat heterogeneous but no discrete mass is identified. Portal vein is patent with hepatopetal flow.  No ascites is evident.  Spleen appears slightly enlarged with a length of 13 cm and transverse diameters of 5 x 16 cm with a volume of 577 mL. No focal splenic abnormality is seen.  Post cholecystectomy.  Renal cortical thinning. This may be associated with medical renal disease.   Electronically Signed   By: Onalee Hua  Call M.D.   On: 07/02/2013 08:55    Physical Exam: BP 100/60  Pulse 60  Ht 6\' 1"  (1.854 m)  Wt 198 lb 12.8 oz (90.175 kg)  BMI 26.23 kg/m2 Constitutional: Pleasant,well-developed, white male in no acute distress. HEENT: Normocephalic and atraumatic. Conjunctivae are normal. No scleral icterus. Neck supple.  Cardiovascular: Normal rate, regular rhythm.  Pulmonary/chest: Effort normal and breath sounds normal. No wheezing, rales or rhonchi. Abdominal: Soft, nondistended, mild RUQ tenderness. Negative Carnett's sign. Bowel sounds active throughout. There are no masses palpable.  Extremities: no edema Lymphadenopathy: No cervical adenopathy noted. Neurological: Alert and oriented to person place and time. Skin: Skin is warm and dry. No rashes noted. Psychiatric: Normal mood and affect. Behavior is normal.   ASSESSMENT AND PLAN: 43. 79 year old male with  compensated cirrhosis. HCC screening yesterday. No liver masses on ultrasound. AFP normal.   2. Chronic right sided abdominal pain. Acute RUQ pain following abdominal ultrasound. Deep probing by tech precipitated the pain which was later followed by transient nausea and a loose BM. His exam today is not concerning. Patient actually looks quite well considering his advanced age and multiple medical problems. He wanted reassurance which I provided. Patient knows to call us back if needed.

## 2013-07-03 NOTE — Progress Notes (Signed)
Reviewed and agree with management plan.  Konnie Noffsinger T. Temeka Pore, MD FACG 

## 2013-07-03 NOTE — Patient Instructions (Signed)
Please follow up with us as needed

## 2013-07-03 NOTE — Progress Notes (Deleted)
HPI :    Past Medical History  Diagnosis Date  . CAD (coronary artery disease)   . Hypertension   . Hyperlipidemia   . Renal insufficiency   . Peripheral edema   . GERD (gastroesophageal reflux disease)   . Diabetes mellitus, type 2   . Nonspecific elevation of levels of transaminase or lactic acid dehydrogenase (LDH)   . Headache(784.0)   . Incisional hernia   . Oral aphthae   . Anxiety and depression   . BPH (benign prostatic hyperplasia)   . IBS (irritable bowel syndrome)   . Iron deficiency anemia   . Tubular adenoma of colon 10/1993  . Thrombocytopenia   . Hearing loss   . Diverticulosis   . Hemorrhoids   . Colon cancer 1985    arising from a rectal polyp  . Peripheral neuropathy 08/11/2011  . RLS (restless legs syndrome) 08/11/2011  . Postural dizziness   . Cirrhosis   . Portal venous hypertension   . CKD (chronic kidney disease) 08/15/2012  . Hepatic cirrhosis 08/15/2012  . BPH (benign prostatic hypertrophy) 09/24/2012  . Chest pain   . Rectal bleeding   . Syncope       Family History  Problem Relation Age of Onset  . Heart disease Mother   . Colon cancer Neg Hx   . Heart disease Brother     x2   History  Substance Use Topics  . Smoking status: Former Smoker    Types: Cigarettes    Quit date: 09/05/1958  . Smokeless tobacco: Never Used  . Alcohol Use: 0.0 oz/week     Comment: 1 beer once a month, reports drank heavily about 40 years ago   Current Outpatient Prescriptions  Medication Sig Dispense Refill  . citalopram (CELEXA) 20 MG tablet Take 20 mg by mouth daily.      Marland Kitchen ezetimibe (ZETIA) 10 MG tablet Take 1 tablet (10 mg total) by mouth daily.  90 tablet  3  . finasteride (PROSCAR) 5 MG tablet Take 1 tablet (5 mg total) by mouth daily.  90 tablet  3  . omeprazole (PRILOSEC) 20 MG capsule Take 20 mg by mouth daily as needed. For acid reflux      . pravastatin (PRAVACHOL) 80 MG tablet Take 80 mg by mouth daily.      . TRADJENTA 5 MG TABS tablet  take 1 tablet by mouth once daily  90 tablet  3   No current facility-administered medications for this visit.   Allergies  Allergen Reactions  . Avodart [Dutasteride] Other (See Comments)    unknown  . Flomax [Tamsulosin Hcl]   . Novocain [Procaine] Other (See Comments)    Unknown reaction     Review of Systems: All systems reviewed and negative except where noted in HPI.    Dg Chest 2 View  06/09/2013   CLINICAL DATA:  Cough.  EXAM: CHEST - 2 VIEW  COMPARISON:  05/18/2012  FINDINGS: The heart size and mediastinal contours are within normal limits. There is no evidence of pulmonary edema, consolidation, pneumothorax, nodule or pleural fluid. The visualized skeletal structures are unremarkable.  IMPRESSION: No active disease.   Electronically Signed   By: Irish Lack M.D.   On: 06/09/2013 16:06   US Abdomen Complete  07/02/2013   CLINICAL DATA:  History of cirrhosis, diabetes, prior cholecystectomy, and prior appendectomy.  EXAM: ULTRASOUND ABDOMEN COMPLETE  COMPARISON:  Ultrasound 07/31/2012.  CT 12/20/2012.  FINDINGS: Gallbladder  There is history of  prior cholecystectomy. No gallbladder is evident.  Common bile duct  Diameter: The common bile duct measures 4.8 mm. No choledocholithiasis is evident.  Liver  History is given of hepatic cirrhosis. There is slight scalloping of the serosal surface consistent with is. Parenchyma is somewhat heterogeneous but no discrete mass is identified. Portal vein is patent with hepatopetal flow.  IVC  No abnormality visualized.  Pancreas  Visualized portion unremarkable. Bowel gas obscures portions of pancreatic tail.  Spleen  Spleen appears slightly enlarged with a length of 13 cm and transverse diameters of 5 x 16 cm with a volume of 577 mL. No focal splenic abnormality is seen.  Right Kidney  Length: Right renal length is 10.9 cm. Echogenicity within normal limits. No mass or hydronephrosis visualized. There is cortical thinning.  Left Kidney   Length: Left renal length is 12.5 cm. Echogenicity within normal limits. No mass or hydronephrosis visualized. There is cortical thinning.  Abdominal aorta  No aneurysm visualized.  Maximum diameter is 2.5 cm.  IMPRESSION: History is given of hepatic cirrhosis. There is slight scalloping of the serosal surface consistent with is. Parenchyma is somewhat heterogeneous but no discrete mass is identified. Portal vein is patent with hepatopetal flow.  No ascites is evident.  Spleen appears slightly enlarged with a length of 13 cm and transverse diameters of 5 x 16 cm with a volume of 577 mL. No focal splenic abnormality is seen.  Post cholecystectomy.  Renal cortical thinning. This may be associated with medical renal disease.   Electronically Signed   By: Onalee Hua  Call M.D.   On: 07/02/2013 08:55    Physical Exam: BP 100/60  Pulse 60  Ht 6\' 1"  (1.854 m)  Wt 198 lb 12.8 oz (90.175 kg)  BMI 26.23 kg/m2 Constitutional: Pleasant,well-developed, ***male in no acute distress. HEENT: Normocephalic and atraumatic. Conjunctivae are normal. No scleral icterus. Neck supple.  Cardiovascular: Normal rate, regular rhythm.  Pulmonary/chest: Effort normal and breath sounds normal. No wheezing, rales or rhonchi. Abdominal: Soft, nondistended, nontender. Bowel sounds active throughout. There are no masses palpable. No hepatomegaly. Extremities: no edema Lymphadenopathy: No cervical adenopathy noted. Neurological: Alert and oriented to person place and time. Skin: Skin is warm and dry. No rashes noted. Psychiatric: Normal mood and affect. Behavior is normal.   ASSESSMENT AND PLAN:

## 2013-07-11 ENCOUNTER — Other Ambulatory Visit: Payer: Self-pay

## 2013-09-03 ENCOUNTER — Ambulatory Visit: Payer: Medicare Other | Admitting: Internal Medicine

## 2013-09-26 ENCOUNTER — Other Ambulatory Visit: Payer: Self-pay

## 2013-09-26 MED ORDER — PRAVASTATIN SODIUM 80 MG PO TABS: 80.0000 mg | ORAL_TABLET | Freq: Every day | ORAL | Status: AC

## 2013-10-01 ENCOUNTER — Telehealth: Payer: Self-pay

## 2013-10-01 NOTE — Telephone Encounter (Signed)
Pharmacy requesting refill on metformin, not on current list.  Advise please

## 2013-10-01 NOTE — Telephone Encounter (Signed)
I believe as of June 2014 last visit he was taking the tradjenta only, ok to clarify with pt

## 2013-10-02 NOTE — Telephone Encounter (Signed)
Confirmed patient only on tradjenta and will inform the pharmacy.

## 2014-03-28 IMAGING — CR DG CHEST 2V
2 series · 2 of 2 positions shown · non-contrast
Comparison: Chest x-ray of 05/11/2011

CLINICAL DATA: Difficulty breathing, weakness

CHEST - 2 VIEW

[w chest pa]
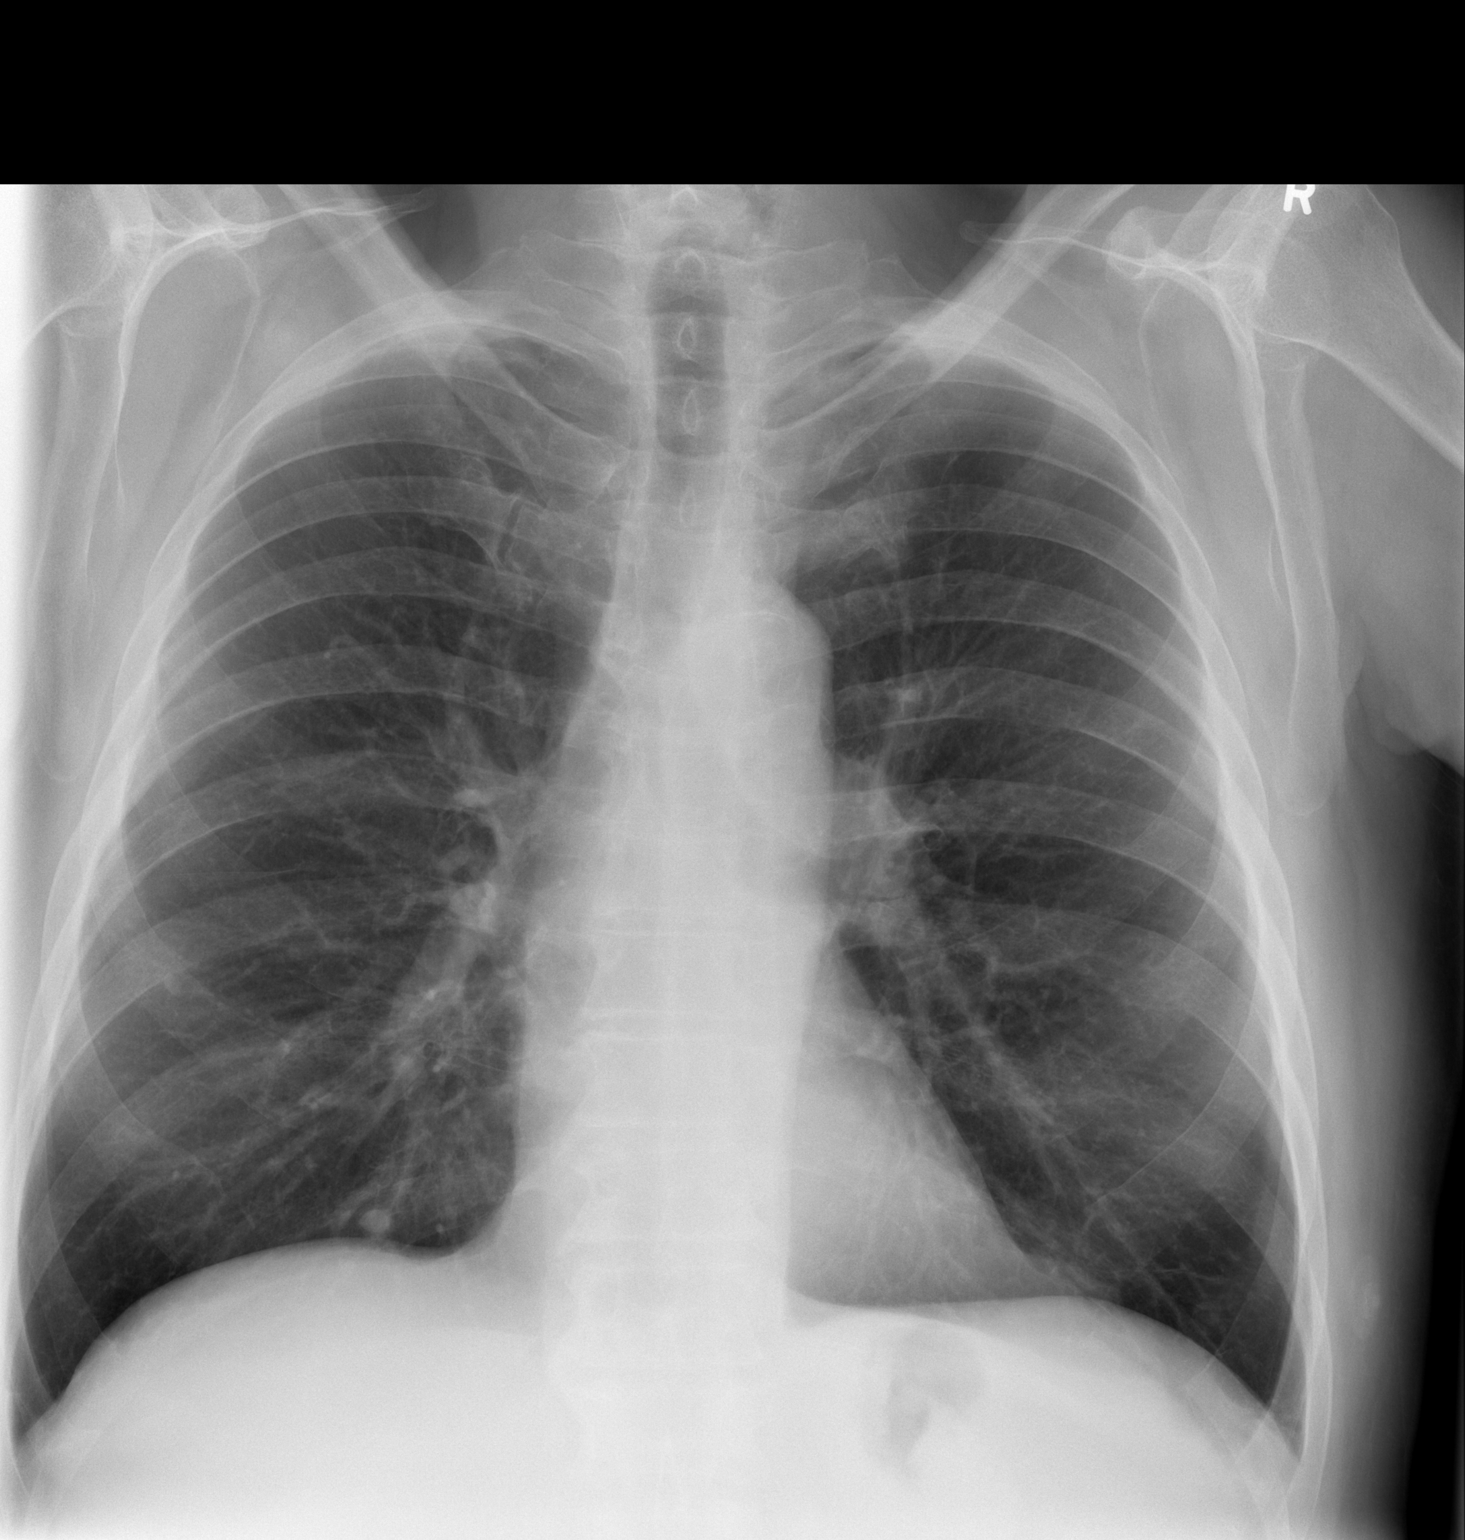

[w chest lat]
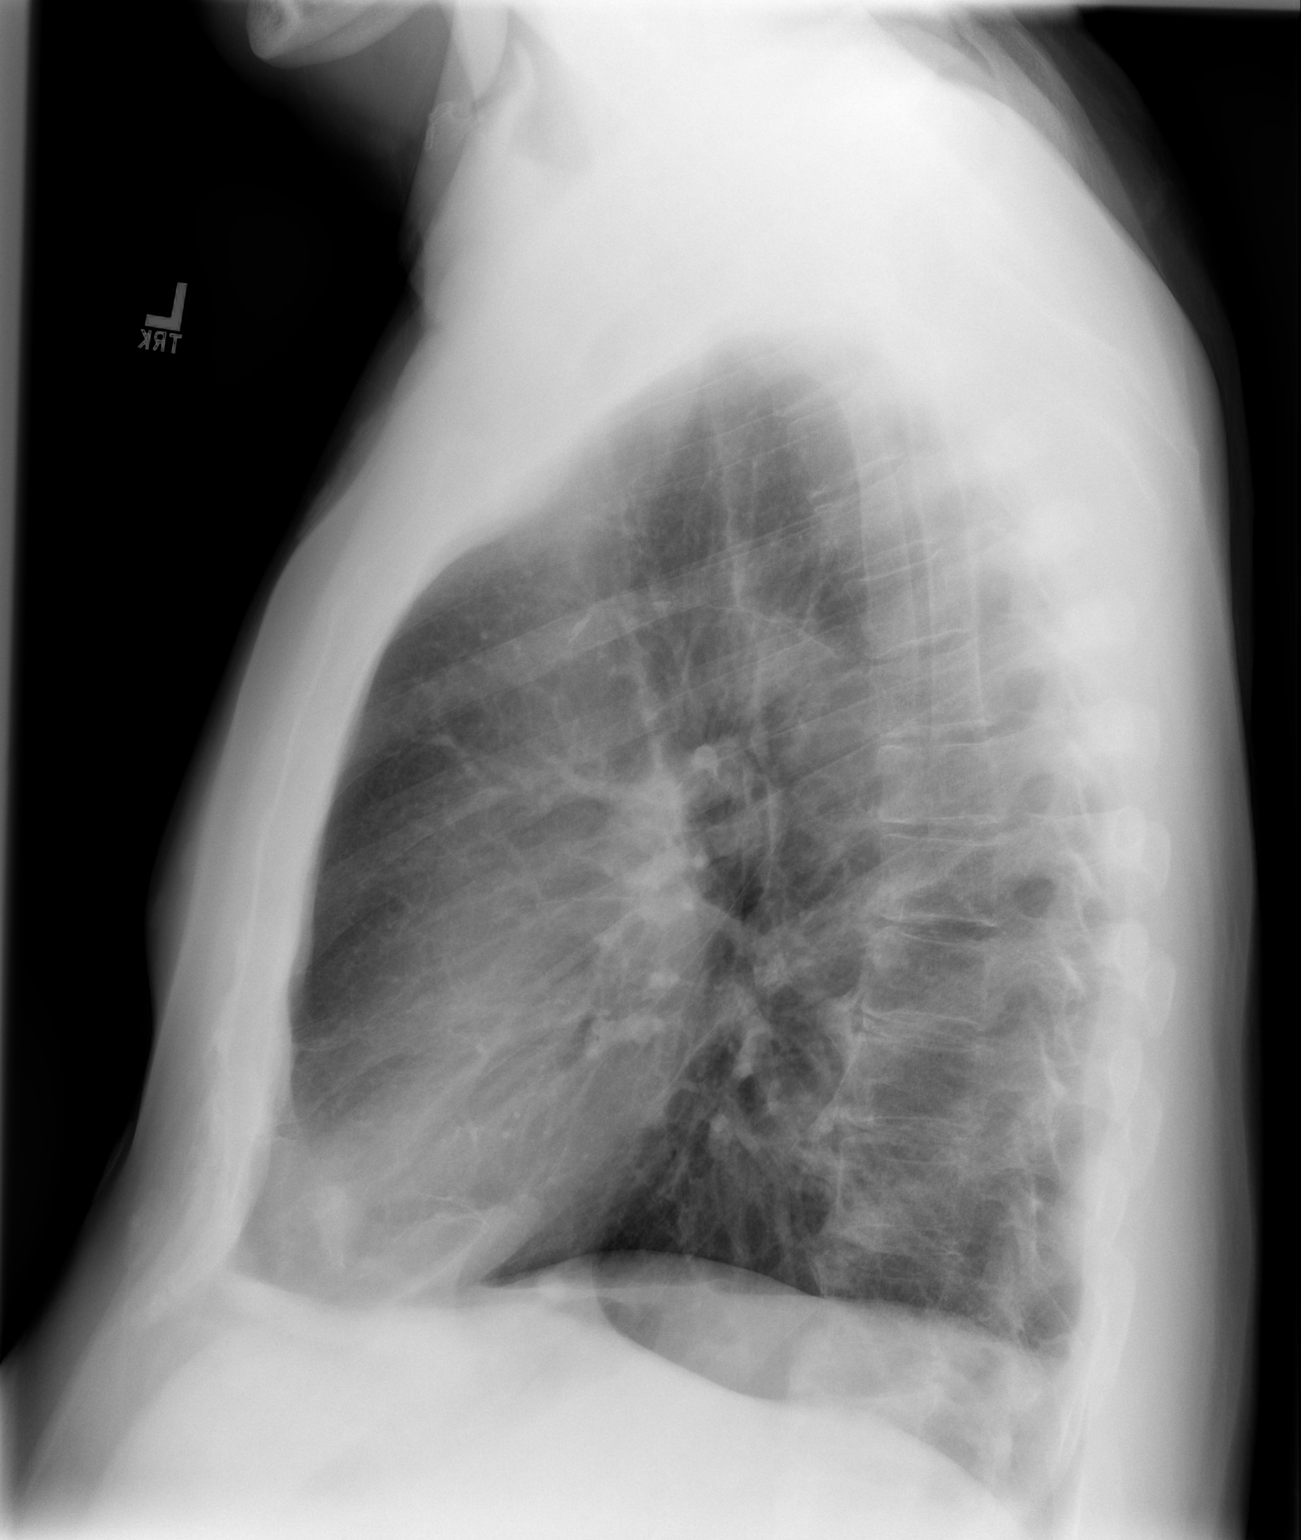

[2 of 2 positions shown; findings below may reference images not displayed]

FINDINGS: The lungs remain clear and somewhat hyperaerated.  A
granuloma into the right lung base is stable.  No infiltrate or
effusion is seen.  Mediastinal contours are stable.  The heart is
within normal limits in size.  No bony abnormality is seen.
IMPRESSION: Stable chest x-ray.  No active lung disease.

## 2014-04-21 ENCOUNTER — Encounter: Payer: Self-pay | Admitting: Gastroenterology

## 2014-07-24 ENCOUNTER — Other Ambulatory Visit: Payer: Self-pay | Admitting: Internal Medicine

## 2015-03-02 ENCOUNTER — Other Ambulatory Visit: Payer: Self-pay

## 2015-10-06 ENCOUNTER — Other Ambulatory Visit: Payer: Self-pay | Admitting: Internal Medicine

## 2016-02-24 ENCOUNTER — Other Ambulatory Visit: Payer: Self-pay

## 2016-02-24 MED ORDER — EZETIMIBE 10 MG PO TABS: 10.0000 mg | ORAL_TABLET | Freq: Every day | ORAL | Status: AC

## 2021-12-04 DEATH — deceased

## 2022-02-03 DEATH — deceased
